# Patient Record
Sex: Female | Born: 1955 | Race: White | Hispanic: No | Marital: Married | Smoking: Never smoker
Health system: Southern US, Community
[De-identification: ages and names within clinical notes are randomized; demographics above are authoritative.]

## PROBLEM LIST (undated history)

## (undated) DIAGNOSIS — F1010 Alcohol abuse, uncomplicated: Secondary | ICD-10-CM

## (undated) DIAGNOSIS — C801 Malignant (primary) neoplasm, unspecified: Secondary | ICD-10-CM

## (undated) DIAGNOSIS — R51 Headache: Secondary | ICD-10-CM

## (undated) DIAGNOSIS — K859 Acute pancreatitis without necrosis or infection, unspecified: Secondary | ICD-10-CM

## (undated) DIAGNOSIS — F419 Anxiety disorder, unspecified: Secondary | ICD-10-CM

## (undated) DIAGNOSIS — I10 Essential (primary) hypertension: Secondary | ICD-10-CM

## (undated) HISTORY — PX: SHOULDER SURGERY: SHX246

## (undated) HISTORY — PX: APPENDECTOMY: SHX54

## (undated) HISTORY — PX: JOINT REPLACEMENT: SHX530

## (undated) HISTORY — PX: LUNG LOBECTOMY: SHX167

## (undated) SURGERY — OPEN REDUCTION INTERNAL FIXATION (ORIF) PROXIMAL HUMERUS FRACTURE
Anesthesia: General | Laterality: Left

---

## 1998-10-04 ENCOUNTER — Emergency Department (HOSPITAL_COMMUNITY): Admission: EM | Admit: 1998-10-04 | Discharge: 1998-10-04 | Payer: Self-pay | Admitting: Emergency Medicine

## 1999-01-11 ENCOUNTER — Inpatient Hospital Stay (HOSPITAL_COMMUNITY): Admission: AD | Admit: 1999-01-11 | Discharge: 1999-01-14 | Payer: Self-pay | Admitting: *Deleted

## 2001-06-28 ENCOUNTER — Other Ambulatory Visit: Admission: RE | Admit: 2001-06-28 | Discharge: 2001-06-28 | Payer: Self-pay | Admitting: Obstetrics and Gynecology

## 2003-01-23 ENCOUNTER — Encounter (INDEPENDENT_AMBULATORY_CARE_PROVIDER_SITE_OTHER): Payer: Self-pay | Admitting: Specialist

## 2003-01-23 ENCOUNTER — Ambulatory Visit: Admission: RE | Admit: 2003-01-23 | Discharge: 2003-01-23 | Payer: Self-pay | Admitting: Internal Medicine

## 2003-02-10 ENCOUNTER — Ambulatory Visit (HOSPITAL_COMMUNITY): Admission: RE | Admit: 2003-02-10 | Discharge: 2003-02-10 | Payer: Self-pay | Admitting: Thoracic Surgery

## 2003-02-10 ENCOUNTER — Encounter: Payer: Self-pay | Admitting: Thoracic Surgery

## 2003-02-10 ENCOUNTER — Encounter (INDEPENDENT_AMBULATORY_CARE_PROVIDER_SITE_OTHER): Payer: Self-pay | Admitting: *Deleted

## 2003-02-18 ENCOUNTER — Encounter: Payer: Self-pay | Admitting: Thoracic Surgery

## 2003-02-18 ENCOUNTER — Encounter (INDEPENDENT_AMBULATORY_CARE_PROVIDER_SITE_OTHER): Payer: Self-pay | Admitting: Specialist

## 2003-02-18 ENCOUNTER — Inpatient Hospital Stay (HOSPITAL_COMMUNITY): Admission: RE | Admit: 2003-02-18 | Discharge: 2003-02-24 | Payer: Self-pay | Admitting: Thoracic Surgery

## 2003-02-19 ENCOUNTER — Encounter: Payer: Self-pay | Admitting: Thoracic Surgery

## 2003-02-20 ENCOUNTER — Encounter: Payer: Self-pay | Admitting: Thoracic Surgery

## 2003-02-21 ENCOUNTER — Encounter: Payer: Self-pay | Admitting: Thoracic Surgery

## 2003-02-22 ENCOUNTER — Encounter: Payer: Self-pay | Admitting: Thoracic Surgery

## 2003-02-23 ENCOUNTER — Encounter: Payer: Self-pay | Admitting: Thoracic Surgery

## 2003-02-24 ENCOUNTER — Encounter: Payer: Self-pay | Admitting: Thoracic Surgery

## 2003-03-04 ENCOUNTER — Encounter: Admission: RE | Admit: 2003-03-04 | Discharge: 2003-03-04 | Payer: Self-pay | Admitting: Thoracic Surgery

## 2003-03-04 ENCOUNTER — Encounter: Payer: Self-pay | Admitting: Thoracic Surgery

## 2003-03-25 ENCOUNTER — Encounter: Admission: RE | Admit: 2003-03-25 | Discharge: 2003-03-25 | Payer: Self-pay | Admitting: Thoracic Surgery

## 2003-03-25 ENCOUNTER — Encounter: Payer: Self-pay | Admitting: Thoracic Surgery

## 2003-04-24 ENCOUNTER — Encounter: Payer: Self-pay | Admitting: Thoracic Surgery

## 2003-04-24 ENCOUNTER — Encounter: Admission: RE | Admit: 2003-04-24 | Discharge: 2003-04-24 | Payer: Self-pay | Admitting: Thoracic Surgery

## 2003-06-26 ENCOUNTER — Encounter: Payer: Self-pay | Admitting: Thoracic Surgery

## 2003-06-26 ENCOUNTER — Encounter: Admission: RE | Admit: 2003-06-26 | Discharge: 2003-06-26 | Payer: Self-pay | Admitting: Thoracic Surgery

## 2003-09-25 ENCOUNTER — Encounter: Admission: RE | Admit: 2003-09-25 | Discharge: 2003-09-25 | Payer: Self-pay | Admitting: Thoracic Surgery

## 2003-12-24 ENCOUNTER — Encounter: Admission: RE | Admit: 2003-12-24 | Discharge: 2003-12-24 | Payer: Self-pay | Admitting: Thoracic Surgery

## 2004-07-13 ENCOUNTER — Encounter: Admission: RE | Admit: 2004-07-13 | Discharge: 2004-07-13 | Payer: Self-pay | Admitting: Thoracic Surgery

## 2005-01-25 ENCOUNTER — Encounter: Admission: RE | Admit: 2005-01-25 | Discharge: 2005-01-25 | Payer: Self-pay | Admitting: Thoracic Surgery

## 2005-06-08 ENCOUNTER — Inpatient Hospital Stay (HOSPITAL_COMMUNITY): Admission: EM | Admit: 2005-06-08 | Discharge: 2005-06-16 | Payer: Self-pay | Admitting: Emergency Medicine

## 2005-09-21 ENCOUNTER — Encounter: Admission: RE | Admit: 2005-09-21 | Discharge: 2005-09-21 | Payer: Self-pay | Admitting: Thoracic Surgery

## 2006-04-12 ENCOUNTER — Encounter: Admission: RE | Admit: 2006-04-12 | Discharge: 2006-04-12 | Payer: Self-pay | Admitting: Thoracic Surgery

## 2006-05-05 ENCOUNTER — Inpatient Hospital Stay (HOSPITAL_COMMUNITY): Admission: AD | Admit: 2006-05-05 | Discharge: 2006-05-08 | Payer: Self-pay | Admitting: Orthopedic Surgery

## 2006-07-31 ENCOUNTER — Other Ambulatory Visit: Admission: RE | Admit: 2006-07-31 | Discharge: 2006-07-31 | Payer: Self-pay | Admitting: Family Medicine

## 2009-02-09 ENCOUNTER — Other Ambulatory Visit: Admission: RE | Admit: 2009-02-09 | Discharge: 2009-02-09 | Payer: Self-pay | Admitting: Family Medicine

## 2009-02-11 ENCOUNTER — Ambulatory Visit (HOSPITAL_BASED_OUTPATIENT_CLINIC_OR_DEPARTMENT_OTHER): Admission: RE | Admit: 2009-02-11 | Discharge: 2009-02-11 | Payer: Self-pay | Admitting: Family Medicine

## 2009-02-11 ENCOUNTER — Ambulatory Visit: Payer: Self-pay | Admitting: Diagnostic Radiology

## 2009-03-23 ENCOUNTER — Encounter (INDEPENDENT_AMBULATORY_CARE_PROVIDER_SITE_OTHER): Payer: Self-pay | Admitting: Obstetrics and Gynecology

## 2009-03-23 ENCOUNTER — Ambulatory Visit (HOSPITAL_COMMUNITY): Admission: RE | Admit: 2009-03-23 | Discharge: 2009-03-23 | Payer: Self-pay | Admitting: Obstetrics and Gynecology

## 2010-03-04 ENCOUNTER — Other Ambulatory Visit: Admission: RE | Admit: 2010-03-04 | Discharge: 2010-03-04 | Payer: Self-pay | Admitting: Obstetrics and Gynecology

## 2011-01-03 LAB — BASIC METABOLIC PANEL
CO2: 26 mEq/L (ref 19–32)
Calcium: 9.2 mg/dL (ref 8.4–10.5)
Creatinine, Ser: 0.61 mg/dL (ref 0.4–1.2)
GFR calc Af Amer: >60 mL/min (ref >60)
Glucose, Bld: 114 mg/dL — ABNORMAL HIGH (ref 70–99)
Sodium: 138 mEq/L (ref 135–145)

## 2011-01-03 LAB — CBC
Platelets: 236 10*3/uL (ref 150–400)
RBC: 4.94 MIL/uL (ref 3.87–5.11)
WBC: 5.2 10*3/uL (ref 4.0–10.5)

## 2011-02-08 NOTE — Op Note (Signed)
[name], [name]               ACCOUNT NO.:  [id number]   MEDICAL RECORD NO.:  [id number]          PATIENT TYPE:  AMB   LOCATION:  SDC                           FACILITY:  [facility]   PHYSICIAN:  Gerald Leitz, MD          DATE OF BIRTH:  1955-10-25   DATE OF PROCEDURE:  03/23/2009  DATE OF DISCHARGE:  03/23/2009                               OPERATIVE REPORT   PREOPERATIVE DIAGNOSES:  1. Postmenopausal bleeding.  2. Endometrial polyp.   POSTOPERATIVE DIAGNOSES:  1. Postmenopausal bleeding.  2. Endometrial polyp.   PROCEDURES:  Hysteroscopy, dilation and curettage, resection of  endometrial polyp.   SURGEON:  Gerald Leitz, MD   ASSISTANT:  None.   ANESTHESIA:  General.   FINDINGS:  Endometrial polyp.   SPECIMEN:  Endometrial curettings and endometrial polyp.   DISPOSITION TO SPECIMEN:  Pathology.   ESTIMATED BLOOD LOSS:  Minimal.   SORBITOL DEFICIT:  100 mL.   INDICATIONS:  This is a 55 year old postmenopausal lady, noted to have  suspected endometrial polyp on ultrasound, measuring 1.6 x 1.2 cm.   PROCEDURES IN DETAIL:  She was taken to the operating room where she was  placed under general anesthesia.  She was placed in the dorsal lithotomy  position and then prepped and draped in the usual sterile fashion.  A  speculum was placed into the vaginal vault and the anterior lip of the  cervix was grasped with a single-tooth tenaculum.  The uterus was  sounded to 7 cm.  The uterus was dilated to approximately 6 mm and  diagnostic hysteroscope was inserted with the findings noted above.  Attempts at removing the endometrial polyp with polyp forceps was  attempted.  Portion of the polyp was removed.  However, a large portion  remained.  Decision was made to resect the remaining portion of the  polyp.  At this point, the resectoscope was introduced into the  endometrial canal and the polyp was resected successfully.  Once  resection was complete, the resectoscope was removed and  the sharp  curette was introduced and a curettage was performed until a gritty  texture was noted.  A 10 mL of 0.25%  Marcaine were injected at the 4  and 8 o'clock position for postoperative anesthesia.  Single tooth  tenaculum was  removed from the anterior lip of the cervix.  Excellent hemostasis was  noted.  Speculum was removed.  The patient was awake from anesthesia and  taken to the recovery room awake and in stable condition.  Sponge, lap  and needle counts were correct x2.      Gerald Leitz, MD  Electronically Signed     TC/MEDQ  D:  03/23/2009  T:  03/24/2009  Job:  [id number]

## 2011-02-11 NOTE — Op Note (Signed)
[name], [name]                           ACCOUNT NO.:  [id number]   MEDICAL RECORD NO.:  [id number]                   PATIENT TYPE:  INP   LOCATION:  2899                                 FACILITY:  [facility]   PHYSICIAN:  Ines Bloomer, M.D.              DATE OF BIRTH:  [birth year]   DATE OF PROCEDURE:  02/18/2003  DATE OF DISCHARGE:                                 OPERATIVE REPORT   PREOPERATIVE DIAGNOSES:  1. 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  2. Mediastinal and hilar adenopathy.  3. 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   POSTOPERATIVE DIAGNOSES:  1. Carcinoid tumor, right bronchus intermedius.  2. 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   PROCEDURES:  1. Right video-assisted thoracoscopic surgery.  2. Right thoracotomy with rib resection.  3. Right middle and right lower lobectomy.   SURGEON:  Ines Bloomer, M.D.   ASSISTANT:  Toribio Harbour, N.P.   CLINICAL NOTE:  This patient had presented with a right lower lobe pneumonia  and atelectasis, underwent a bronchoscopy, which showed a lesion in the  right lower lobe that was coming up into the bronchus intermedius.  Mediastinoscopy had also been done, which revealed granulomatous  inflammation in the nodes.  She is brought to the operating room for  probable right middle and right lower lobectomy.   DESCRIPTION OF PROCEDURE:  After general anesthesia and percutaneous  insertion of all monitoring lines, she was turned to the right lateral  thoracotomy position and a dual-lumen tube was inserted.  She was prepped  and draped in the usual sterile manner.  Two trocar sites were made in the  seventh intercostal space at the anterior and posterior axillary lines.  A  trocar was inserted and a 30 degree scope was inserted, and you could see  complete collapse of the right lower lobe.  No evidence of pulmonary  metastases.  A posterolateral thoracotomy was made over the fifth  intercostal space.  The latissimus was divided and  the serratus was  reflected anteriorly.  However, in trying to enter this space because of her  body habitus, it was really hard to get the intercostal space open.  The  ribs were very narrow space, so it was decided to do a sixth rib resection,  and this was taken out using the [name] periosteal [name] using the  subperiosteal resection, and then using the [name] to  complete the resection.  A [name] was used to remove the rib.  After the rib had been removed, a Finochietto rib [name] was inserted and  a [name] was inserted at right angles.  The inferior pulmonary  ligament was taken down.  There were a lot of adhesions in the inferior  pulmonary ligament, and the diaphragmatic surface of the right lower lobe  was stuck to the diaphragm.  This was taken down by electrocautery.  The  inferior pulmonary vein was dissected out  and looped with a vascular tape.  Several 9R nodes were taken.  Then dissection was started in the fissure.  Dissecting down in the fissure there were very large nodes in the fissure,  and these were dissected out.  10R and 11R nodes were dissected free until  first of all the superior portion of the fissure was dissected out,  dissecting out first the posterior mediastinum and then dissecting down in  the fissure and dividing the superior portion of the fissure with three  applications of the EZ-45 and removing several 10R and 11R nodes.  These  nodes were sent for frozen section that revealed granulomatous inflammation.  There was a small posterior branch to the right upper lobe from the  pulmonary artery, and this was doubly ligated and divided with 2-0 silk and  clipped with a vascular tape.  Then the branch to the right middle lobe was  also dissected out, ligated with the Autosuture stapler, and then the  pulmonary artery to the right lower lobe was dissected out, looped with a  vascular tape, and stapled and  divided with an Systems analyst.  Then the  inferior pulmonary vein was divided with the [facility] stapler.  The vein  to the middle lobe went off the superior pulmonary vein, was dissected out  and divided with the [facility] stapler.  The right middle lobe minor  fissure was divided with four applications of the EZ-45 stapler.  Then a TL-  30 stapler was placed around the bronchus intermedius, stapled, and divided.  The right middle lobe and the right lower lobe were removed, taking all the  lymph nodes with them, including a lot of 7 nodes that were inferior to the  bronchus.  The bronchial margins were negative.  It was a carcinoid tumor  that was also almost completely occluding the right lower lobe and it caused  abscess formation of the right lower lobe, and the debris from that was  cultured.  All bleeding was electrocoagulated.  An intercostal muscle flap  was created from the fifth intercostal space, and this was rotated down and  sutured over the bronchial stump with interrupted 2-0 silk.  No air leaks  were seen underwater after being checked underwater.  Two chest tubes were  brought into the trocar sites and tied in place with 0 silk.  The chest was  closed with five paracostals of double-stranded 0 chromic, #1 Vicryl in the  muscle layer, 2-0 Vicryl in the subcutaneous tissue, and Ethicon skin clips.  The patient returned to the recovery room in serious condition.                                               Ines Bloomer, M.D.    DPB/MEDQ  D:  02/18/2003  T:  02/18/2003  Job:  [id number]   cc:   Casimiro Needle B. Sherene Sires, M.D. [facility]

## 2011-02-11 NOTE — Op Note (Signed)
NAMEYASMINE, [name]               ACCOUNT NO.:  [id number]   MEDICAL RECORD NO.:  [id number]          PATIENT TYPE:  INP   LOCATION:  5039                         FACILITY:  [facility]   PHYSICIAN:  [name], M.D. DATE OF BIRTH:  1955/12/21   DATE OF PROCEDURE:  05/05/2006  DATE OF DISCHARGE:                                 OPERATIVE REPORT   SURGEON:  [name], M.D.   ASSISTANT:  Donnie Coffin. Durwin Nora, P.A.   PREOPERATIVE DIAGNOSIS:  Right shoulder displaced proximal humerus fracture.   POSTOPERATIVE DIAGNOSIS:  Right shoulder displaced proximal humerus  fracture.   PROCEDURE PERFORMED:  Right shoulder hemiarthroplasty using DePuy [employer] FX  system.   ANESTHESIA:  General anesthesia was used.   ESTIMATED BLOOD LOSS:  250 cc.   FLUID REPLACEMENT:  1250 cc.   URINE OUTPUT:  Was not recorded.   COUNTS:  Instrument count was correct.   COMPLICATIONS:  There were no complications.   ANTIBIOTICS:  Perioperative antibiotics were given.   INDICATIONS:  The patient is a 55 year old female with a history of a fall  onto the right shoulder.  The patient presented to orthopedics with a  displaced proximal humerus fracture.  The patient had gross angulation of  the fracture site, significant comminution of the tuberosities and rotation  of the humeral head posteriorly.  The patient underwent CT scanning,  verifying the comminuted, displaced nature of her fracture.  The patient was  admitted for further care, and after discussing the patient's options for  management to include attempted ORIF versus humeral arthroplasty, I  recommended to her arthroplasty, and she consented to this.  Informed  consent was obtained.   DESCRIPTION OF PROCEDURE:  After an adequate level of anesthesia was  achieved, the patient was positioned in modified beach-chair position and  all neurovascular structures padded appropriately.  The right shoulder was  sterilely prepped and draped in the  usual manner.  A deltopectoral approach  was done.  We utilized an incision from the coracoid process down to the  anterior insertion of the deltoid.  Dissection was carried sharply down  through subcutaneous tissues using Bovie electrocautery.  We  identified the  cephalic vein and took it laterally with the deltoid, and the pectoralis  medially.  We released the upper 0.5 cm of pectoralis to facilitate  visualization.  We identified the conjoined tendon, took it medially, and  then identified the fractured humerus.  The patient's greater tuberosity was  well posterior and fragmented in several pieces.  The rotator cuff was  attached to these pieces.  The subscapularis and lesser tuberosity were  still attached to the humeral head.  We went ahead and used an osteotome  through the bicipital groove to separate the lesser tuberosity off.  We then  freed it up from surrounding capsule and the coracoid process until we had a  nice balance.  Next, we retrieved the head, which was sized to a 48 x 18  head, and then placed sutures in the subscapularis medial to the lesser  tuberosity, and in the rotator cuff lateral  to the greater tuberosity  fragment in a modified W-stitch technique, gaining good purchase with 3  sutures out on the greater tuberosity and 2 on the medial lesser tuberosity.  At this point, we went ahead and prepared the humeral shaft, went ahead and  found the canal with the 6-mm reamer, and then placed an 8-mm reamer, which  we actually had to work fairly hard to get that fully down.  And then I  attempted to pass a 10-mm reamer, thinking that she would be a 10, and could  only get that half way down and worked  quite hard on this and simply could  not pass that all the way down despite using vigorous hand-driven reaming.  At this juncture, I went ahead and decided to use a size 8 prosthesis for  her.  I feel that likely her cortices were thicker and she simply tapered  down to  much to use a 10.  Thus, we placed the 8 in with a 48 x an 18 head,  and trialed this and selected the appropriate height.  The patient was then  laser marked.  These were 6 laser marks down the fractured humerus at the  bicipital groove.  Next, we then removed the trial components, placed 3  drill holes in the humerus, and two #2 FiberWire sutures through those  holes.  We placed an around-the-world stitch through the prosthesis, and  then went ahead using DePuy I cement, vacuum mixed, and cemented the  prosthesis into place, again, with the appropriate height and appropriate  rotation based on referencing the anterior fin to the bicipital groove.  Once the cement was allowed to harden, we bone grafted the proximal humerus.  Went ahead and repaired the tuberosities to themselves, to the prosthesis,  to the around-the-world stitch, and also to the humeral shaft with the  sutures that were through the shaft.  We had a nice repair.  We took this  through a full range of motion.  No relative motion was noted between the  fragments or the prosthesis or the shaft; and, thus, we were happy with the  stability of the repair.  We went ahead and thoroughly irrigated, went ahead  and placed sutures in the deltopectoral interval, protecting the cephalic  vein, and then closed the subcutaneous tissues.  We placed Marcaine in the  deltoid, and in the pectoralis as well to help with postoperative pain, and  then closed the subcutaneous tissues, and then the skin with Vicryl, and  then Monocryl.  Steri-Strips were applied, followed by a sterile dressing.  The patient was taken to the recovery room, having tolerated surgery well.           ______________________________  [name], M.D.     SRN/MEDQ  D:  05/06/2006  T:  05/06/2006  Job:  [id number]

## 2011-02-11 NOTE — Discharge Summary (Signed)
[name], [name]                           ACCOUNT NO.:  [id number]   MEDICAL RECORD NO.:  [id number]                   PATIENT TYPE:  INP   LOCATION:  3307                                 FACILITY:  [facility]   PHYSICIAN:  Ines Bloomer, M.D.              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  Lymph nodes found to have  granulomatous inflammation.   SECONDARY DIAGNOSES:  1. Hypertension.  2. Status post appendectomy in 1975.  3. Status post cesarean sections in April 1983 and February 1989.   PROCEDURES:  Right video-assisted thoracoscopy Feb 18, 2003; right  thoracotomy; right middle lobectomy; right lower lobectomy; and lymph node  dissection performed by D. Karle Plumber, M.D.   HISTORY:  [name] is a 55 year old female who presented initially with a  chronic cough.  She had pneumonia in January 2004.  Chest x-ray taken at  that time showed a questionable hilar mass with some mediastinal and hilar  adenopathy.  There was also consolidation in the right lower lobe suspicious  for endobronchial lesion.  She underwent bronchoscopy and mediastinoscopy on  Feb 10, 2003.  This study showed granulomatous inflammation consistent with  sarcoid disease.  She had a right middle lobe biopsy which showed submucosal  neoplasm with features suggestive of carcinoid tumor.  She was admitted on  Feb 18, 2003, for right lower lobectomy and possible right middle lobectomy  to rule out carcinoid/sarcoid disease.   HOSPITAL COURSE:  The patient tolerated the procedure well and was  transferred in stable satisfactory condition to the recovery room.  Her  postoperative progress encompassed five days.  She has done well during this  time.  She has been afebrile in the  postoperative period.  She was weaned to  room air.  By postoperative day number three, she was ambulating  independently.  At the time of discharge, her incision was looking good.   Her chest tube had a slight leak in the postoperative period.  The first  posterior tube was removed on postoperative day number two.  The second tube  was placed on water seal postoperative day number four.  A chest x-ray on  the morning of postoperative day number four showed a very small right  apical air space.  The chest tube was removed on postoperative day number  five without difficulty.   The patient's pain was controlled with epidural catheter, then changed to  Percocet, and morphine sulfate for breakthrough pain.   DISCHARGE INSTRUCTIONS:  She is to call Dr. Scheryl Darter office if her incision  starts draining.  Dr. Scheryl Darter office will call her with an appointment for  one week after discharge.  She  is to have a chest x-ray one hour before the  visit at [facility].   DISCHARGE MEDICATIONS:  1. Percocet 5/325 mg 1-2 tabs q.4-6h. p.r.n. pain.  2. Vioxx 25 mg q.d. for three days.  3. She is to restart her Cardizem, Avapro and Ativan as prior to surgery.   ACTIVITY:  She is to walk daily to keep up her strength.  She may shower  daily.   DIET:  She has no restrictions on her diet.   DISCHARGE DISPOSITION:  [name] went home on postoperative day  number six after an uncomplicated recovery.  She has followup with Dr.  Edwyna Shell in one week and medications.     Maple Mirza, P.A.                    Ines Bloomer, M.D.    GM/MEDQ  D:  02/23/2003  T:  02/23/2003  Job:  [id number]   cc:   Weldon Picking, M.D.  [place]  [place] B. Sherene Sires, M.D. [facility]

## 2011-02-11 NOTE — Op Note (Signed)
   [name], [name]                           ACCOUNT NO.:  [id number]   MEDICAL RECORD NO.:  [id number]                   PATIENT TYPE:  INP   LOCATION:  2899                                 FACILITY:  [facility]   PHYSICIAN:  Burna Forts, M.D.             DATE OF BIRTH:  [birth year]   DATE OF PROCEDURE:  02/18/2003  DATE OF DISCHARGE:                                 OPERATIVE REPORT   INDICATION FOR PROCEDURE:  The patient is a 55 year old female with a known  lung bronchus carcinoma to be resected by Ines Bloomer, M.D.   PROCEDURE:  Right thoracotomy with middle and lower lobe resection,  performed by Ines Bloomer, M.D.   ANESTHESIA PROCEDURE:  Placement of low thoracic epidural for postoperative  analgesia.   DESCRIPTION OF PROCEDURE:  Preoperatively risks and benefits of the  placement of the epidural catheter were discussed in detail with the  patient, including alternatives for pain control.  Additionally, Dr. Edwyna Shell  had requested that we place the epidural for postoperative analgesia.  The  patient consented to the placement of the epidural for her postoperative  analgesia.   At the end of the operative procedure the patient was allowed to remain in  the left lateral decubitus position.  A sterile prep of the thoracic area  was conducted.  Then using a Tuohy needle adjacent to the T8-9 interspace,  the epidural space was contacted with a loss of resistance technique and a  catheter threaded with ease approximately 3-4 cm beyond the needle tip, and  the needle was removed.  After negative aspiration for both heme and CSF,  the catheter was injected incrementally with 1% Xylocaine, initial test dose  of 3 mL followed by 5 mL more for a total of 8 mL of 1% containing  approximately 75 mcg of fentanyl.  This was secured in place with tape, the  patient turned supine, and transferred to the PACU after extubation in  stable condition.   DISPOSITION:  The patient  will be followed daily by the department of  anesthesiology for her postoperative analgesia via epidural catheter.                                               Burna Forts, M.D.    JTM/MEDQ  D:  02/18/2003  T:  02/18/2003  Job:  [id number]

## 2011-02-11 NOTE — Op Note (Signed)
   [name], [name]                           ACCOUNT NO.:  [id number]   MEDICAL RECORD NO.:  [id number]                   PATIENT TYPE:  OIB   LOCATION:  2857                                 FACILITY:  [facility]   PHYSICIAN:  Ines Bloomer, M.D.              DATE OF BIRTH:  1956/02/17   DATE OF PROCEDURE:  02/10/2003  DATE OF DISCHARGE:                                 OPERATIVE REPORT   PREOPERATIVE DIAGNOSIS:  Right lower lobe polyp with mediastinal adenopathy.   POSTOPERATIVE DIAGNOSIS:  Right lower lobe polyp with mediastinal  adenopathy.   OPERATION PERFORMED:  Fiberoptic bronchoscopy, mediastinoscopy, laser  bronchoscopy.   SURGEON:  Ines Bloomer, M.D.   ANESTHESIA:  General.   DESCRIPTION OF PROCEDURE:  After general anesthesia, fiberoptic bronchoscope  was passed through the endotracheal tube.  As the video bronchoscope was  used, the carina was in the midline, the left upper lobe, left lower lobe  and left mainstem bronchus were normal.  On the right side, the right upper  lobe orifice was normal, the right mainstem bronchus was normal but the  right bronchus intermedius, you see a polypoid lesion coming up the bronchus  intermedius.  The right middle lobe appeared to be stable.  Biopsies were  taken from this area of the polyp and it started bleeding, so it was decided  to control this with the laser.  The laser was set at 25 watts continuous  using free beam and the oxygen was dropped to 30% and with multiple pulses,  450 joules were used to laser the top part of the mass and stop all  bleeding.  The fiberoptic bronchoscope was then removed.  The anterior neck  was prepped and draped in the usual sterile manner.  A transverse incision  was made.  This was carried down with electrocautery through the  subcutaneous tissue and fascia.  The pretracheal fascia was entered and  biopsies of 7, 4R and 2R were done.  Strap muscles were closed with 0  Vicryl,  subcutaneous tissue with 3-0 Vicryl and Dermabond for the skin.  The  patient tolerated the procedure well, was returned to the recovery room in  stable condition.                                                Ines Bloomer, M.D.    DPB/MEDQ  D:  02/10/2003  T:  02/10/2003  Job:  [id number]   cc:   Casimiro Needle B. Sherene Sires, M.D. [facility]

## 2011-02-11 NOTE — Discharge Summary (Signed)
[name], [name]               ACCOUNT NO.:  [id number]   MEDICAL RECORD NO.:  [id number]          PATIENT TYPE:  INP   LOCATION:  5039                         FACILITY:  [facility]   PHYSICIAN:  [name], M.D. DATE OF BIRTH:  [birth year]   DATE OF ADMISSION:  05/05/2006  DATE OF DISCHARGE:  05/08/2006                                 DISCHARGE SUMMARY   ADMISSION DIAGNOSES:  1. Right proximal humerus fracture.  2. Hypertension.  3. History of renal failure.  4. History of two-thirds of her right lung resected.   DISCHARGE DIAGNOSES:  1. Right proximal humerus fracture status post hemiarthroplasty.  2. Hypertension.  3. History of renal failure.  4. History of two-thirds of her right lung resected.   BRIEF HISTORY:  The patient is a 55 year old female presented to our office  on May 05, 2006 complaining about right shoulder pain status post a fall  the previous night.  The patient was noted to have a completely displaced  humeral head from her shaft.  She was admitted for surgical management of  this humerus fracture.  The patient had all questions answered before  admission to the hospital.   PROCEDURE:  The patient had a right shoulder hemiarthroplasty completed on  May 06, 2006 using a [employer] FX system.   ATTENDING SURGEON:  Malon Kindle, M.D.   ASSISTANT:  Standley Dakins, PA-C   ANESTHESIA:  General anesthesia was used.   ESTIMATED BLOOD LOSS:  250 mL   FLUID REPLACEMENT:  400 mL   No complications and perioperative antibiotics were given.   HOSPITAL COURSE:  The patient was admitted on May 05, 2006 for a  preoperative workup for right shoulder hemiarthroplasty that was completed  on May 06, 2006.  After she underwent the above-stated procedure, which  she tolerated well, she was transferred to 5000 after adequate time in the  post-anesthesia care unit.  On postop day one, the patient complained about  moderate pain to that right shoulder,  was unable to work with any type of  exercises.  That day was kept in a sling with ice to the shoulder.  Her labs  were monitored.  On postop day two, the patient did have some mild  hypokalemia and hyponatremia mainly due to the IV fluids but the patient had  no resulting symptoms.  Her dressing was changed.  There was no sign of  cellulitis, erythema, or infection to her shoulder incision.  Neurovascularly, she is intact distally.  We did review her exercises once  again and the patient elected to be discharged home on May 08, 2006.   DISCHARGE PLAN:  The patient will be discharged home with family members on  May 08, 2006.  Her condition is stable.   DISCHARGE MEDICATIONS:  1. Cardizem/Tiazac 240 mg p.o. daily.  2. Hydrochlorothiazide 25 mg p.o. daily.  3. Avapro 30 mg p.o. daily.  4. Ativan 0.5 mg p.o. q.8h. p.r.n.  5. Robaxin 500 mg p.o. q.6h.  6. Percocet one to two tabs q.4-6h. p.r.n. pain.   ALLERGIES:  CIPRO.   DIET:  Regular.   ACTIVITY:  Non weightbearing with this right upper extremity.      [name], P.A.    ______________________________  [name], M.D.    TBD/MEDQ  D:  05/08/2006  T:  05/08/2006  Job:  [id number]

## 2011-02-11 NOTE — H&P (Signed)
NAME:  [name], [name]                 ACCOUNT NO.:  [id number]   MEDICAL RECORD NO.:  [id number]           PATIENT TYPE:   LOCATION:                                 FACILITY:   PHYSICIAN:  [name], M.D. DATE OF BIRTH:  [birth year]   DATE OF ADMISSION:  DATE OF DISCHARGE:                                HISTORY & PHYSICAL   CHIEF COMPLAINT:  Right shoulder pain.   HISTORY OF PRESENT ILLNESS:  The patient is a 55 year old female that fell  last p.m. on May 04, 2006 injuring her right shoulder. The patient noticed  obvious deformity and swelling, presented to her primary care physician's  office, Dr. Foy [name], earlier this morning diagnosed with a shoulder fracture,  referred to our office. After seeing [place] in the office, she was directed over  to [facility]. She denies any previous problem with her shoulder in  the past.   PAST SURGICAL HISTORY:  Two-thirds of the right lung removed, C section x2  and appendectomy.   PAST MEDICAL HISTORY:  Hypertension, anxiety, headache, sarcoidosis, history  of pancreatitis, kidney failure and lung cancer with lung resection.   CURRENT MEDICATIONS:  Ativan, Lotrel, hydrochlorothiazide, Ultram,  __________ and Cardizem, unknown dosages of each. The patient has allergies  to CIPRO and AZITHROMYCIN.   SOCIAL HISTORY:  A patient of Dr. Marinda Elk, she does not smoke, she is  right-hand dominant.   PHYSICAL EXAMINATION:  GENERAL:  The patient is a healthy-appearing, 55-year-  old female in no acute distress, adequate mood and affect status post  injury. Alert and oriented x3.  CERVICAL SPINE:  She has no tenderness to palpation in the paraspinous  muscles or spinous process. She has full range of motion without any  difficulty and cranial nerves II-XII are grossly intact.  EXTREMITIES:  Examination of the right upper extremity shows obvious edema  and ecchymosis to the right upper extremity. No range of motion was  attempted  secondary to known fracture. Capillary refill was 2 seconds  distally. Examination of the left upper extremity and bilateral lower  extremity shows full range of motion. Sensation grossly intact. No gross  deformity.  RESPIRATORY:  Respirations are normal.  ABDOMEN:  Nontender, nondistended.   X-rays show a right proximal humerus fracture with complete displacement.   IMPRESSION:  Right proximal humerus fracture with complete displacement.   PLAN:  Dr. Ranell Patrick is to direct admit this patient to [facility] for  surgical open reduction and internal fixation versus hemiarthroplasty  tomorrow morning. Labs and CT scans have been ordered and we will see the  patient in the hospital.      Maisie Fus B. Dixon, P.A.    ______________________________  [name], M.D.    TBD/MEDQ  D:  05/05/2006  T:  05/05/2006  Job:  [id number]

## 2011-02-11 NOTE — Discharge Summary (Signed)
NAMEBETTEJANE, LEAVENS NO.:  [id number]   MEDICAL RECORD NO.:  [id number]          PATIENT TYPE:  INP   LOCATION:  2039                         FACILITY:  [facility]   PHYSICIAN:  Sherin Quarry, MD      DATE OF BIRTH:  03-23-1956   DATE OF ADMISSION:  06/08/2005  DATE OF DISCHARGE:  06/16/2005                                 DISCHARGE SUMMARY   [name] is a 55 year old lady who has a past history of resection of  the right middle and lower lobe of her lung apparently for a carcinoid  tumor. Ms. Paulding presented to the hospital on June 08, 2005 with a  three-week history of an illness characterized by nausea, vomiting,  diarrhea, abdominal pain and decreased oral intake. She had developed  urinary tract symptoms had been treated with Bactrim DS for a possible  urinary tract infection prior to her admission. For several days prior to  admission, she reported generalized muscle aches and feeling unsteady on her  feet. On June 08, 2005, she presented to Dr. Pablo Lawrence office where  laboratory studies were obtained showing that her serum creatinine was 13.9.  She was therefore sent to the emergency room for evaluation of acute renal  failure.   In the emergency room, the patient was seen by Dr. Derenda Mis. At that  time, her temperature was 97.3, blood pressure was 76/19. She was  subsequently given two liters of fluid by bolus infusion and blood pressure  came up to 122/90. Respiratory rate was 20, O2 saturation 96%. The patient  complained of generalized aching. HEENT exam was within normal limits. The  chest was clear. Cardiovascular revealed normal S1-S2 without rubs, murmurs  or gallops. The abdomen was diffusely tender. There was no guarding or  rebound. No masses were appreciated. Neurologic testing examination  extremities was normal.   Initial laboratory studies including a sodium of 126, potassium 3.2,  creatinine was 13.9, BUN 168. White count  was 11.7, hemoglobin 10.1.  Urinalysis was unremarkable. A CT scan of the abdomen was obtained with  findings consistent with acute pancreatitis. There was decreased enhancement  in the head and uncinate process of the pancreas and a fluid collection  adjacent to the pancreas. On admission, the patient was placed empirically  on imipenem 5 milligrams IV every 6 hours. Intravenous fluids in the form of  d5w with 3 ampules of bicarbonate were infused at 150 cc/hour. Protonix 40  milligrams IV every 24 hours was started. Several sets of blood cultures  were obtained which were subsequently negative. It should be noted that the  CT scan of the abdomen was not actually obtained until the three days after  admission because of renal dysfunction.   The patient's renal function was carefully monitored with IV hydration. By  June 09, 2005, creatinine was down to 9.1. Later in the day on  June 09, 2005, the creatinine was checked again was down to 4.2. by  June 10, 2005, creatinine was down to 2.2.   Consultation was obtained from Dr. Hyman Hopes of the nephrology service who agreed  that the patient's basic problem was massive volume depletion with secondary  acute renal failure probably exacerbated by ACE inhibitors and Goody  powders. He agreed with plan to continue large volume fluid resuscitation.   By June 10, 2005, the patient was feeling better and she continued a  pattern of slow improvement. On June 10, 2005, a clear liquid diet was  begun. On June 14, 2005, the patient had a single episode of nausea,  but really this has been completely resolved. By June 15, 2005,  creatinine was down to 0.6, BUN 9, potassium was 3.7. CO2 was 26 showing  resolution of acidosis. Serial blood cultures were negative. Her blood  pressure was stable and 120-130/80. During the course of hospitalization,  the patient's alcohol consumption was discussed on several occasions. The   patient indicated that she was not drinking excessive amount of alcohol. On  June 16, 2005, the patient's status appeared to be stable and she was  gradually getting stronger. She was tolerating a bland diet and decision was  made to discharge at that time.   DISCHARGE DIAGNOSES:  1.  Acute renal failure secondary to prerenal azotemia with associated      massive volume depletion.  2.  Hypotension secondary to #1.  3.  Metabolic acidosis secondary to #1.  4.  Hypokalemia resolved.  5.  Pancreatitis.  6.  History of hypertension.  7.  History of appendectomy.  8.  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   DISCHARGE MEDICATIONS:  1.  On discharge, the patient will continue Ativan which she normally takes      a dose of 1 milligram b.i.d.  2.  I advised her to take Protonix 40 milligrams daily.  3.  I advised her to withhold Lotrel 5/20.  4.  I told her to resume Tenoretic 50/25 one daily.   She ready has an appointment to see Dr. Foy Guadalajara back in the office next week.  The patient was advised on several occasions of the importance of absolutely  drinking no alcohol and of not taking any Goody powders or any nonsteroidal  anti-inflammatory drugs.           ______________________________  Sherin Quarry, MD     SY/MEDQ  D:  06/16/2005  T:  06/16/2005  Job:  [id number]   cc:   Molly Maduro L. Foy Guadalajara, M.D.  [address] [place]  [place] [address]  Fax: [phone]   Garnetta Buddy, M.D.  Fax: [phone]

## 2011-02-11 NOTE — Consult Note (Signed)
[name], [name] NO.:  [id number]   MEDICAL RECORD NO.:  [id number]          PATIENT TYPE:  INP   LOCATION:  1828                         FACILITY:  [facility]   PHYSICIAN:  Garnetta Buddy, M.D.   DATE OF BIRTH:  Oct 05, 1955   DATE OF CONSULTATION:  06/08/2005  DATE OF DISCHARGE:                                   CONSULTATION   REASON FOR CONSULTATION:  Elevated BUN and serum creatinine.   HISTORY OF PRESENT ILLNESS:  This is a 55 year old white female carcinoid  tumor of the lung, status post resection right middle lobe and right lower  lobe, endobronchial component in 2004. She presented with a three-week  history of nausea, vomiting, diarrhea, abdominal pain, decreased oral  intake, two-day history of fatigue, weakness, and myalgias.   PAST MEDICAL HISTORY:  1.  Hypertension.  2.  History of appendectomy in 1975.  3.  History of Cesarean section in 1983 and 1989.  4.  History of carcinoid tumor of lungs, status post resection in 2004.   MEDICATIONS:  1.  Lotrel 20/10 mg daily.  2.  Atenolol 50 mg daily.  3.  Aspirin one tablet daily.  4.  Goody Powder over-the-counter.   ALLERGIES:  CIPRO.   SOCIAL HISTORY:  Married. No tobacco or alcohol.   FAMILY HISTORY:  Noncontributory.   REVIEW OF SYSTEMS:  GENERAL: No fever or chills. Admits to fatigue,  weakness, and myalgias. EYES: No visual decrease. No diplopia. EARS, NOSE,  MOUTH, THROAT: No hearing loss. Complains of dry mouth. No sinusitis. No  epistaxis. CARDIOVASCULAR: No shortness of breath, chest pain, or ankle or  leg swelling. RESPIRATORY:  No cough, wheeze, or hemoptysis. ABDOMEN: No  history of EGD or colonoscopy. No history of blood in stools or hematemesis.  History of abdominal pain, diarrhea, nausea, and vomiting. UROGENITAL: No  history of dysuria, urgency, or frequency. NEUROLOGIC: No history of  headache, diplopia. Dysarthria, or dysphagia. No numbness or tingling in the  left lower  extremity. ENDOCRINE:  No history of diabetes and no history of  thyroid disease or renal disease. HEMATOLOGIC/ONCOLOGIC: No history of DVT,  problem with __________  or bleeding diathesis. History of carcinoid.  MUSCULOSKELETAL:  Diffuse myalgias. No history of gout. Use of Goody Powder.   PHYSICAL EXAMINATION:  GENERAL: Alert, pale lady who is slightly anxious, in  no obvious distress.  VITAL SIGNS: Blood pressure 90 systolic, pulse 85, temperature afebrile,  respiratory rate 12, saturations 100% on two liters oxygen.  HEENT: No icterus. Bilateral conjunctival pallor. Extraocular movements  intact. Pupils equal, round, and reactive. Clear oropharynx. Poor dentition.  Nasal mucosa is clear. Dry mucous membranes.  NECK: Supple with JVD flat. No thyromegaly, tinted skin.  CARDIOVASCULAR: Regular rate and rhythm. No murmurs, rubs, or gallops.  RESPIRATORY: Lung fields are clear. No wheezes or rales.  ABDOMEN: Tender epigastric, no masses, no organosplenomegaly. Bowel sounds  are diminished.  EXTREMITIES: No clubbing, cyanosis, or edema.  NEUROLOGIC: Alert and oriented, symmetric reflexes. No loss of sensation.   LABORATORY DATA:  Sodium 126, potassium 3.2, chloride 94, CO2 11,  BUN 168,  creatinine 13.9, glucose 133, calcium 8.4. WBC 11.7, hemoglobin 10.1,  platelet count 378,000.  Urinalysis reveals wbc's 3-6 per high power field  and RBCs too numerous to count status post Foley. Lipase 131, lactate 0.9,  CPK 18.   CT scan of abdomen revealed pancreatic edema. No evidence of ureteric  obstruction. Chest x-ray  Clear.   ABG reveals pH of 7.294, PCO2 18, PO2 121.   ASSESSMENT:  1.  Acute renal failure with massive volume depletion in the setting of ACE      inhibitors, Goody Powders, and evidence of clinically acute pancreatitis      as well as radiologic appearance of pancreatitis. She has no evidence of      multi-organ failure at this time, no sepsis, and no acute respiratory       distress syndrome.  2.  Hypotension. Agree with massive volume resuscitation. Recommend IV      fluids, CVP monitoring, and aggressive volume resuscitation with normal      saline, bicarbonate, potassium chloride, and ICU monitoring.  3.  Metabolic acidosis. Treat with intravenous bicarbonate therapy.  4.  Hypokalemia. Treat with intravenous potassium therapy.   DISPOSITION:  The patient is to be transferred to the ICU. Full discussion  with Dr. Derenda Mis. No indications for acute dialysis at the present  time.  I have discussed this with the family and agree with massive volume  resuscitation to a CVT in 12.  Critical care consultation recommended as  well as monitoring of laboratory variations.      Garnetta Buddy, M.D.  Electronically Signed     MWW/MEDQ  D:  06/08/2005  T:  06/09/2005  Job:  [id number]

## 2011-02-11 NOTE — H&P (Signed)
NAME:  [name], Maria A.                        ACCOUNT NO.:  [id number]   MEDICAL RECORD NO.:  [id number]                   PATIENT TYPE:  INP   LOCATION:  NA                                   FACILITY:  [facility]   PHYSICIAN:  Ines Bloomer, M.D.              DATE OF BIRTH:  16-Sep-1956   DATE OF ADMISSION:  02/18/2003  DATE OF DISCHARGE:                                HISTORY & PHYSICAL   REFERRING PHYSICIANS:  1. Casimiro Needle B. Sherene Sires, M.D.  2. Doris Cheadle. Foy Guadalajara, M.D.   CHIEF COMPLAINT:  Right lower lobe carcinoid tumor.   BRIEF HISTORY:  The patient is a 55 year old white female who presented with  chronic cough in January of 2004.  She was diagnosed with pneumonia and was  treated with antibiotics.  Chest x-ray showed a hilar mass and a right  mediastinal hilar adenopathy with consolidation of the right lower lobe.  She also was found to have a bronchial lesion.  This was confirmed with CT  after adequate antibiotic treatment for her reported bronchitis/pneumonia.  She was eventually referred to Casimiro Needle B. Sherene Sires, M.D., and he sent her to  Ines Bloomer, M.D., for evaluation at this time.  The patient underwent  bronchoscopy and mediastinoscopy on Feb 10, 2003, which showed granulomatous  inflammation consistent with sarcoid.  A right middle lobe biopsy was also  done at this point and a submucosal neoplasm with features suggestive of  carcinoid tumor was found.  She is now admitted for right lower lobectomy  and possible right middle lobectomy to rule out carcinoid and sarcoid  disease.  Pulmonary function studies showed an FVC of 2.39 L, which was 65%  of normal.  The FEV1 was 2.01 L, which is 67% of normal.   PAST MEDICAL HISTORY:  Essentially negative, except for some hypertension.   PAST SURGICAL HISTORY:  She had an appendectomy in May of 1975 and C-  sections on March 05, 1982, and November 14, 1987.   MEDICATIONS ON ADMISSION:  1. Cardizem LA 360 mg daily.  2. Avapro 300  mg daily.  3. Ativan one p.o. b.i.d.  She has been on this for two years.   ALLERGIES:  No allergies.  She is quite sensitive to CIPRO, which causes  nausea.   REVIEW OF SYSTEMS:  No history of thyroid disease.  No swallowing  difficulties.  Her weight is actually up since Christmas and she was hoping  to lose some.  She has no history of diabetes, kidney disease, asthma, or  COPD.  No history of TIAs or strokes.  No history of syncope or amaurosis.  No history of coronary artery disease, angina, or arrhythmia.  No history of  MI.  No history of pulmonary embolus.  No history of DVT.  No history of GI  bleed.  No trouble with reflux.  She has no problems with diarrhea or  constipation.  There is no history of congestive heart failure.  She does  have a history of hypertension.  No history of shortness of breath, dyspnea  on exertion, PND, or orthopnea.  She has a history of bronchitis on a year  basis.  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   FAMILY HISTORY:  Her mother is living at age 32, but she has DVT, pulmonary  embolus, and cardiac problems.  Her father died at age 64 with leukemia.  She has one sister in good health and no brothers.  She has been married 23  years.  Two children, ages 27 and 39, both in good health.  She has worked  as a Engineer, site for many years and currently is working as a proof  Chief Operating Officer.  She has never used tobacco or alcohol.   PHYSICAL EXAMINATION:  GENERAL APPEARANCE:  This is a well-nourished, well-  developed, white female, slightly overweight and in no acute distress.  HEENT:  Head:  Normocephalic.  Eyes:  PERRLA.  EOMs intact.  Fundi not  visualized well.  No arcus senilis.  NECK:  Supple.  No JVD.  No bruits.  No thyromegaly.  No lymphadenopathy.  CHEST:  Clear to auscultation and percussion bilaterally.  No murmurs, rubs,  or wheezing.  CARDIAC:  Regular S1.  Split S2.  No S3 or S4.  No murmurs or rubs.   ABDOMEN:  Soft and nontender.  Positive bowel sounds.  No  hepatosplenomegaly.  No masses.  No bruits.  GENITOURINARY:  Deferred.  RECTAL:  Deferred.  EXTREMITIES:  No cyanosis, clubbing, or edema.  The hair pattern is normal.  Pulses are +2 and equal throughout the distribution.  NEUROLOGIC:  No focal deficits.   IMPRESSION:  1. Carcinoid, right lower lobe.  2. Questionable sarcoid.  3. Hypertension.  4. History of anxiety.   PLAN:  The patient is admitted for right thoracotomy, right lower lobectomy,  and possible right middle lobectomy.     Eber Hong, P.A.                 Ines Bloomer, M.D.    WDJ/MEDQ  D:  02/14/2003  T:  02/14/2003  Job:  [id number]

## 2011-02-11 NOTE — H&P (Signed)
NAMEARTEMISIA, [name]               ACCOUNT NO.:  [id number]   MEDICAL RECORD NO.:  [id number]          PATIENT TYPE:  EMS   LOCATION:  MAJO                         FACILITY:  [facility]   PHYSICIAN:  Melissa L. Ladona Ridgel, MD  DATE OF BIRTH:  01/06/1956   DATE OF ADMISSION:  06/08/2005  DATE OF DISCHARGE:                                HISTORY & PHYSICAL   CHIEF COMPLAINT:  Muscle pain, nausea, hypertension, and acute renal  failure.   PRIMARY CARE PHYSICIAN:  Robert L. Foy Guadalajara, M.D.   HISTORY OF PRESENT ILLNESS:  The patient is a 55 year old white female who 2  weeks ago started with gastroenteritis-like symptoms.  She was treated  conservatively, but then progressed on to having urinary tract-like  symptoms.  She was given Bactrim DS and Bentyl to treat her symptoms of  dysuria and the finding of elevated white cells in her urine.  She states  that she really never recovered from the symptoms of nausea and vomiting,  and over the past couple of weeks following that treatment, she continued to  have nausea, inability to eat, weakness, unable to really drink.  She has  developed muscle pain and body aches all over in the last week, and has  become notably wobbly with her walking and intermittently dizzy.  She  presented to her primary care physician's office with these complaints, and  had laboratory values that were drawn showing that she was in acute renal  failure with a creatinine of 13.9.  She was therefore sent to the emergency  room for further evaluation, as her blood pressure was also noted to be  quite low.   REVIEW OF SYSTEMS:  She denies fever, chills, but has nausea, vomiting.  No  melena, no hematochezia, no diarrhea, no constipation.  Positive body aches.  All other review of systems are negative.  See HPI for further positives.   PAST MEDICAL HISTORY:  1.  Small cell lung cancer, according to the patient.  2.  Hypertension.  3.  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  (No diabetes.).   PAST SURGICAL HISTORY:  She had a right lung resection done by Dr. Edwyna Shell  for her cancer.   SOCIAL HISTORY:  She denies tobacco.  She has occasional ethanol.  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  She is married.   FAMILY HISTORY:  Mom is living with congestive heart failure.  Dad is  deceased secondary to leukemia.   ALLERGIES:  CIPRO which causes severe stomach problems.   MEDICATIONS:  1.  Lotrel 5/20 daily.  2.  Tenoretic 50/25 daily.  3.  Darvocet-N 100 p.r.n.  4.  Fioricet p.r.n.  5.  Ativan 1 mg b.i.d.  6.  Her spouse relates that she is using Goody's powders quite frequently.   PHYSICAL EXAMINATION:  VITAL SIGNS:  Temperature is 97.3, blood pressure on  admission was 76/19 (after 2 liters of fluid, she came up to 122/101;  without fluid support, she drops into the 80s systolically), respiratory  status 20-26 breaths per minute, saturation 96% on room  air.  GENERAL:  She appears uncomfortable secondary to muscular pain and is  shivering.  She is normocephalic and atraumatic.  Pupils equal, round and  reactive to light.  Extraocular muscles are intact.  Mucous membranes are  dry.  She has poor dentition of her front teeth, which are rotting.  She is  anicteric.  NECK:  Supple.  There is a right EJ placed in the emergency room with good  effusion.  There are no bruits.  CHEST:  Clear to auscultation.  There are no rhonchi, rales, or wheezes.  CARDIOVASCULAR:  Regular rate and rhythm.  Positive S1 and S2.  No S3, no  S4.  No murmurs, rubs, or gallops.  ABDOMEN:  Diffusely tender with no guarding or rebound.  There is no  hepatosplenomegaly, and nondistended.  EXTREMITIES:  Pale, cool skin with +1 pulses, upper and lower extremities.  NEUROLOGIC:  She is awake, alert, and oriented x3.  Cranial nerves II-XII  are intact.  Power is 4/5.  DTRs are 2.   LABORATORY DATA:  Sodium of 126, potassium 3.2, chloride 94, CO2 of 11, BUN   168, creatinine 13.9, glucose is 133, gap is 21.  White count is 11.7,  hemoglobin of 10.1, hematocrit of 29.2, platelets of 37.8.  Lactic acid is  0.9, within normal limits.  CK total of 18, within normal limits.  Urinalysis shows large blood, small leukocyte esterase, specific gravity of  1.031, pH of 5, too numerous to count blood cells.  EKG shows normal sinus  rhythm with first degree AV block.  No ST-T wave changes.  Chest x-ray is  pending.  Ultrasound of the abdomen is pending.  CT of the abdomen is  pending.   ASSESSMENT AND PLAN:  This is a 55 year old white female with acute renal  failure following treatment for a urinary tract infection and  gastroenteritis 3 weeks ago.  1.  Genitourinary.  Acute renal failure.  Differential diagnosis includes      severe dehydration, ATN secondary to Motrin and Goody's powder use,      urinary tract infection.  A nephrology consult has been called.  Will      follow up her renal ultrasound, and will treat her urinary tract      infection with ceftriaxone.  2.  Gastrointestinal.  She has diffuse abdominal pain, most likely secondary      to uremia versus an intra-abdominal process, although her lactic acid is      negative.  Her lipase is elevated.  Question whether this is related to      her renal dysfunction versus pancreatitis.  At this time, we will do a      non-contrasted CT of the abdomen with attention to the pancreas, and      start her on a proton pump inhibitor.  Will also check an amylase to      help in the differential diagnosis.  3.  Pulmonary.  History of lung cancer with carcinoid syndrome.  Will check      a chest x-ray.  4.  Cardiovascular.  History of hypertension.  Will hold her      antihypertensives at this time.  Replete her volume, check her lipids,      and then check an hemoglobin and hematocrit after her rehydration, as I     suspect she may drop her hemoglobin.  Will heme check all her stools.  5.  Gapped  acidosis.  She will be started on  D5 water with 3 amps of      bicarbonate at 150 cc an hour after the next liter of IV fluids.  Will      also check an ABG to help diagnosis her underlying condition.  6.  Endocrine.  She has a history of carcinoid with lung cancer.  No further      complications or recurrence of the carcinoid syndrome is reported.  Will      check a hemoglobin A1C, cover with sliding scale insulin, check a      cortisol to rule out possible adrenal insufficiency, although her      potassium levels and other electrolytes do not support this.  Will      replete her KCl.  7.  Deep vein thrombosis prophylaxis with subcutaneous heparin.      Melissa L. Ladona Ridgel, MD  Electronically Signed     MLT/MEDQ  D:  06/08/2005  T:  06/08/2005  Job:  [id number]   cc:   Molly Maduro L. Foy Gu[place]ara, M.D.  Fax: [phone]

## 2011-02-11 NOTE — Op Note (Signed)
[name], [name]                           ACCOUNT NO.:  [id number]   MEDICAL RECORD NO.:  [id number]                   PATIENT TYPE:  AMB   LOCATION:  CARD                                 FACILITY:  [facility]   PHYSICIAN:  Casimiro Needle B. Sherene [name], M.D. [facility]           DATE OF BIRTH:  05/10/1956   DATE OF PROCEDURE:  01/23/2003  DATE OF DISCHARGE:                                 OPERATIVE REPORT   PROCEDURE:  Bronchoscopy.   HISTORY:  This is a 55 year old white female with recent clinical syndrome  that suggested pneumonia but a chest x-ray that suggested hilar adenopathy  and a CT scan that confirmed hilar adenopathy and possible post obstructive  pneumonia involving the right lower lobe. CT scan also suggested the  possibility of a right breast mass. Mammography is pending and I recommended  bronchoscopy to evaluate the adenopathy and the question of whether there  might be a lesion obstructing the right lower lobe. The working diagnosis  was sarcoid because of the amount of adenopathy present but the differential  diagnosis does include malignancy.   The procedure was performed in the bronchoscopy suite after a full  discussion of the risks, benefits, and alternatives. She received a total of  25 mg IV Demerol and 5 mg IV Versed for adequate sedation and cough  suppression.   The right and left nares and oropharynx were liberally anesthetized with 1%  lidocaine by updraft Nebulizer. The right naris was additionally prepared  with 2% lidocaine jelly.   Using a standard flexible fiberoptic bronchoscope, the right naris was  easily cannulated with good visualization of the entire oropharynx and  larynx. The cords moved normally and there were no apparent upper airway  lesions.   Using an additional 1% lidocaine as needed, the entire tracheobronchial tree  was explored bilaterally with the following findings:   1. Trachea and carina were normal.  2. The left sided airways were  normal.  3. The right upper lobe was normal.  4. There was a polypoid mass extending up from the right lower lobe and     completely obstructing the right lower lobe beyond the take off of the     right middle lobe, which was a slit-like orifice that really could not be     entered with a bronchoscope or identified any further. The polypoid mass     had vessels present with a smooth, glistening mucosa.   PROCEDURE:  Using an endobronchial forceps technique, I biopsied the  polypoid mass x4, with adequate tissue obtained. I also lavaged it for  cytology and AFB and fungal stain for culture to be complete.   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   With one of the female respiratory therapists present, I briefly examined  her right breast and axilla for any obvious mass  or node. Although the exam  was limited, she did appear to have a firmness over the central breast that  corresponded to the lesion seen on chest CT, but I did not palpate any  definite nodes. There were no skin changes overlying the density on breast  exam.   RECOMMENDATIONS:  Await tissue confirmation. If this is indeed breast  cancer, it is clearly stage 4 and the decision will need to be made whether  to treat her systemically versus do a laser resection of the polypoid lesion  that appears to be obstructing the right lower lobe airway.   I did inform the patient's husband that there was a polypoid lesion present  but that I was not able to say at this point whether or not it represents  cancer. However, I did tell him unequivocally this is not sarcoid.                                               [name], M.D. [facility]    MBW/MEDQ  D:  01/23/2003  T:  01/23/2003  Job:  [id number]   cc:   Molly Maduro L. Foy Gu[place], M.D.  [address] [place]  [place] [address]  Fax: [phone]

## 2011-06-01 ENCOUNTER — Other Ambulatory Visit: Payer: Self-pay | Admitting: Obstetrics and Gynecology

## 2011-06-01 ENCOUNTER — Other Ambulatory Visit (HOSPITAL_COMMUNITY)
Admission: RE | Admit: 2011-06-01 | Discharge: 2011-06-01 | Disposition: A | Discharge status: Home / Self Care | Payer: 59 | Source: Ambulatory Visit | Attending: Obstetrics and Gynecology | Admitting: Obstetrics and Gynecology

## 2011-06-01 DIAGNOSIS — Z01419 Encounter for gynecological examination (general) (routine) without abnormal findings: Secondary | ICD-10-CM | POA: Insufficient documentation

## 2012-04-06 ENCOUNTER — Emergency Department (HOSPITAL_COMMUNITY)
Admission: EM | Admit: 2012-04-06 | Discharge: 2012-04-06 | Disposition: A | Discharge status: Home / Self Care | Payer: 59 | Attending: Emergency Medicine | Admitting: Emergency Medicine

## 2012-04-06 ENCOUNTER — Encounter (HOSPITAL_COMMUNITY): Payer: Self-pay | Admitting: Emergency Medicine

## 2012-04-06 DIAGNOSIS — W1789XA Other fall from one level to another, initial encounter: Secondary | ICD-10-CM | POA: Insufficient documentation

## 2012-04-06 DIAGNOSIS — I10 Essential (primary) hypertension: Secondary | ICD-10-CM | POA: Insufficient documentation

## 2012-04-06 DIAGNOSIS — Y998 Other external cause status: Secondary | ICD-10-CM | POA: Insufficient documentation

## 2012-04-06 DIAGNOSIS — Y9301 Activity, walking, marching and hiking: Secondary | ICD-10-CM | POA: Insufficient documentation

## 2012-04-06 DIAGNOSIS — S42209A Unspecified fracture of upper end of unspecified humerus, initial encounter for closed fracture: Secondary | ICD-10-CM

## 2012-04-06 HISTORY — DX: Essential (primary) hypertension: I10

## 2012-04-06 MED ORDER — OXYCODONE-ACETAMINOPHEN 5-325 MG PO TABS: 1.0000 | ORAL_TABLET | Freq: Four times a day (QID) | ORAL | Status: DC | PRN

## 2012-04-06 MED ORDER — HYDROMORPHONE HCL PF 2 MG/ML IJ SOLN
2.0000 mg | Freq: Once | INTRAMUSCULAR | Status: AC
  Administered 2012-04-06: 2 mg via INTRAVENOUS
  Filled 2012-04-06: qty 1

## 2012-04-06 MED ORDER — NAPROXEN 500 MG PO TABS: 500.0000 mg | ORAL_TABLET | Freq: Two times a day (BID) | ORAL | Status: DC

## 2012-04-06 MED ORDER — ONDANSETRON 4 MG PO TBDP
8.0000 mg | ORAL_TABLET | Freq: Once | ORAL | Status: AC
  Administered 2012-04-06: 8 mg via ORAL
  Filled 2012-04-06: qty 2

## 2012-04-06 NOTE — Progress Notes (Signed)
Orthopedic Tech Progress Note Patient Details:  [name] May 19, 1956 [id number]  Ortho Devices Type of Ortho Device: Sling immobilizer Ortho Device/Splint Location: left arm Ortho Device/Splint Interventions: Application   [name] 04/06/2012, 1:17 PM

## 2012-04-06 NOTE — ED Notes (Signed)
Pt c/o left arm pain after falling last night; pt went to PCP and has humerus fracture; pt has xray CD with here and in sling for comfort; pt given percocet at PCP; CMS intact

## 2012-04-06 NOTE — ED Provider Notes (Signed)
History   This chart was scribed for Celene Kras, MD by Shari Heritage. The patient was seen in room TR10C/TR10C. Patient's care was started at 1108.     CSN: [id number]  Arrival date & time 04/06/12  1108   First MD Initiated Contact with Patient 04/06/12 1155      Chief Complaint  Patient presents with  . Arm Pain    (Consider location/radiation/quality/duration/timing/severity/associated sxs/prior treatment) Patient is a 56 y.o. female presenting with fall. The history is provided by the patient. No language interpreter was used.  Fall The accident occurred 12 to 24 hours ago. The fall occurred while walking. She fell from a height of 3 to 5 ft. She landed on a hard floor. The point of impact was the left elbow. The pain is present in the left elbow. The pain is at a severity of 10/10. The pain is severe. She was ambulatory at the scene. There was no entrapment after the fall. There was no drug use involved in the accident. There was no alcohol use involved in the accident. Pertinent negatives include no fever, no numbness and no abdominal pain. Prehospitalization: Sling. Treatments tried: Percocet. The treatment provided mild relief.   [name] is a 56 y.o. female who presents to the Emergency Department complaining of left arm pain resulting from a fall that occurred 18 hours ago. Patient says that she went to her PCP and got an x-ray. PCP diagnosed patient with a left humeral fracture. Patient is in sling for comfort. PCP prescribed Percocet during patient's visit. Patient with h/o HTN. Patient has never smoked.   Past Medical History  Diagnosis Date  . Hypertension     History reviewed. No pertinent past surgical history.  History reviewed. No pertinent family history.  History  Substance Use Topics  . Smoking status: Never Smoker   . Smokeless tobacco: Not on file  . Alcohol Use: Yes     occasional    OB History    Grav Para Term Preterm Abortions TAB SAB Ect  Mult Living                  Review of Systems  Constitutional: Negative for fever.  HENT: Negative for neck pain.   Eyes: Negative for visual disturbance.  Respiratory: Negative for cough.   Cardiovascular: Negative for chest pain.  Gastrointestinal: Negative for abdominal pain.  Genitourinary: Negative for frequency.  Musculoskeletal: Negative for back pain.  Skin: Negative for rash.  Neurological: Negative for numbness.  Psychiatric/Behavioral: The patient is not nervous/anxious.     Allergies  Ciprofloxacin  Home Medications   Current Outpatient Rx  Name Route Sig Dispense Refill  . DILTIAZEM HCL ER 180 MG PO CP24 Oral Take 180 mg by mouth daily.    Marland Kitchen ESCITALOPRAM OXALATE 20 MG PO TABS Oral Take 20 mg by mouth daily.    . OXYCODONE-ACETAMINOPHEN 5-325 MG PO TABS Oral Take 1 tablet by mouth every 4 (four) hours as needed. For pain    . VALSARTAN-HYDROCHLOROTHIAZIDE 160-25 MG PO TABS Oral Take 1 tablet by mouth daily.    Marland Kitchen VITAMIN B-12 1000 MCG PO TABS Oral Take 1,000 mcg by mouth daily.    Marland Kitchen VITAMIN C 500 MG PO TABS Oral Take 500 mg by mouth daily.      BP 109/38  Pulse 53  Temp 98.4 F (36.9 C) (Oral)  Resp 18  SpO2 96%  Physical Exam  Nursing note and vitals reviewed. Constitutional: She appears  well-developed and well-nourished. No distress.  HENT:  Head: Normocephalic and atraumatic.  Right Ear: External ear normal.  Left Ear: External ear normal.  Eyes: Conjunctivae are normal. Right eye exhibits no discharge. Left eye exhibits no discharge. No scleral icterus.  Neck: Neck supple. No tracheal deviation present.  Cardiovascular: Normal rate.   Pulmonary/Chest: Effort normal. No stridor. No respiratory distress.  Musculoskeletal: She exhibits no edema.       Left shoulder: She exhibits tenderness and pain.       Left elbow: no tenderness found.       Left forearm: She exhibits no tenderness and no edema.       Left shoulder is positive for edema, pain  with ROM.  Left elbow and forearm are nontender, no edema.  Intact neurovascularly and distally.  Neurological: She is alert. Cranial nerve deficit: no gross deficits.  Skin: Skin is warm and dry. No rash noted.  Psychiatric: She has a normal mood and affect.    ED Course  Procedures (including critical care time) DIAGNOSTIC STUDIES: Oxygen Saturation is 96% on room air, normal by my interpretation.    COORDINATION OF CARE: 11:56AM- Patient informed of current plan for treatment and evaluation and agrees with plan at this time. Patient appears to have an impacted humeral fracture. Recommended that patient follow up with orthopedist. Will apply a shoulder sling and prescribe additional pain medication.  12:53PM- Performed a consult with Dr. Shelle Iron of [facility]. Patient's case was explained and discussed.   Labs Reviewed - No data to display  No results found.   1. Proximal humerus fracture       MDM  Reviewed the x-ray provided by the patient's primary care office. I've spoken with Dr. Shelle Iron regarding having the patient seen in the office for further evaluation. He recommended having the patient call the office to schedule appointment with Dr. Maylene Roes. She has had surgery by him on the shoulder in the past. Patient was provided pain relief here in the emergency department. The [employer] replaced her sling with a more appropriately sized to slightly.     I personally performed the services described in this documentation, which was scribed in my presence.  The recorded information has been reviewed and considered.   Celene Kras, MD 04/06/12 1314

## 2012-04-09 ENCOUNTER — Other Ambulatory Visit: Payer: Self-pay | Admitting: Orthopedic Surgery

## 2012-04-09 ENCOUNTER — Ambulatory Visit
Admission: RE | Admit: 2012-04-09 | Discharge: 2012-04-09 | Disposition: A | Discharge status: Home / Self Care | Payer: 59 | Source: Ambulatory Visit | Attending: Orthopedic Surgery | Admitting: Orthopedic Surgery

## 2012-04-09 DIAGNOSIS — T148XXA Other injury of unspecified body region, initial encounter: Secondary | ICD-10-CM

## 2012-04-11 ENCOUNTER — Encounter (HOSPITAL_COMMUNITY): Payer: Self-pay | Admitting: Orthopedic Surgery

## 2012-04-11 ENCOUNTER — Encounter (HOSPITAL_COMMUNITY)
Admission: RE | Disposition: A | Discharge status: Home / Self Care | Payer: Self-pay | Source: Ambulatory Visit | Attending: Internal Medicine

## 2012-04-11 ENCOUNTER — Encounter (HOSPITAL_COMMUNITY): Payer: Self-pay | Admitting: *Deleted

## 2012-04-11 ENCOUNTER — Inpatient Hospital Stay (HOSPITAL_COMMUNITY)
Admission: RE | Admit: 2012-04-11 | Discharge: 2012-04-16 | DRG: 492 | Disposition: A | Discharge status: Home / Self Care | Payer: 59 | Source: Ambulatory Visit | Attending: Internal Medicine | Admitting: Internal Medicine

## 2012-04-11 ENCOUNTER — Encounter (HOSPITAL_COMMUNITY): Payer: Self-pay | Admitting: Anesthesiology

## 2012-04-11 ENCOUNTER — Ambulatory Visit (HOSPITAL_COMMUNITY): Payer: 59

## 2012-04-11 DIAGNOSIS — Z85118 Personal history of other malignant neoplasm of bronchus and lung: Secondary | ICD-10-CM

## 2012-04-11 DIAGNOSIS — S42213A Unspecified displaced fracture of surgical neck of unspecified humerus, initial encounter for closed fracture: Principal | ICD-10-CM | POA: Diagnosis present

## 2012-04-11 DIAGNOSIS — E860 Dehydration: Secondary | ICD-10-CM | POA: Diagnosis present

## 2012-04-11 DIAGNOSIS — Z79899 Other long term (current) drug therapy: Secondary | ICD-10-CM

## 2012-04-11 DIAGNOSIS — N179 Acute kidney failure, unspecified: Secondary | ICD-10-CM | POA: Diagnosis present

## 2012-04-11 DIAGNOSIS — E871 Hypo-osmolality and hyponatremia: Secondary | ICD-10-CM | POA: Diagnosis present

## 2012-04-11 DIAGNOSIS — Z23 Encounter for immunization: Secondary | ICD-10-CM

## 2012-04-11 DIAGNOSIS — W19XXXA Unspecified fall, initial encounter: Secondary | ICD-10-CM | POA: Diagnosis present

## 2012-04-11 DIAGNOSIS — F1010 Alcohol abuse, uncomplicated: Secondary | ICD-10-CM | POA: Diagnosis present

## 2012-04-11 DIAGNOSIS — E119 Type 2 diabetes mellitus without complications: Secondary | ICD-10-CM | POA: Diagnosis present

## 2012-04-11 DIAGNOSIS — S42302A Unspecified fracture of shaft of humerus, left arm, initial encounter for closed fracture: Secondary | ICD-10-CM | POA: Diagnosis present

## 2012-04-11 DIAGNOSIS — K859 Acute pancreatitis without necrosis or infection, unspecified: Secondary | ICD-10-CM | POA: Diagnosis present

## 2012-04-11 DIAGNOSIS — E118 Type 2 diabetes mellitus with unspecified complications: Secondary | ICD-10-CM | POA: Diagnosis present

## 2012-04-11 DIAGNOSIS — I10 Essential (primary) hypertension: Secondary | ICD-10-CM | POA: Diagnosis present

## 2012-04-11 HISTORY — DX: Acute pancreatitis without necrosis or infection, unspecified: K85.90

## 2012-04-11 HISTORY — DX: Malignant (primary) neoplasm, unspecified: C80.1

## 2012-04-11 HISTORY — DX: Headache: R51

## 2012-04-11 HISTORY — DX: Anxiety disorder, unspecified: F41.9

## 2012-04-11 HISTORY — DX: Alcohol abuse, uncomplicated: F10.10

## 2012-04-11 LAB — SURGICAL PCR SCREEN: Staphylococcus aureus: POSITIVE — AB

## 2012-04-11 LAB — CBC
MCH: 32.2 pg (ref 26.0–34.0)
MCV: 94.7 fL (ref 78.0–100.0)
Platelets: 261 10*3/uL (ref 150–400)
RDW: 12.5 % (ref 11.5–15.5)

## 2012-04-11 LAB — COMPREHENSIVE METABOLIC PANEL
ALT: 15 U/L (ref 0–35)
Calcium: 10.1 mg/dL (ref 8.4–10.5)
GFR calc Af Amer: 58 mL/min — ABNORMAL LOW (ref >90)
Glucose, Bld: 388 mg/dL — ABNORMAL HIGH (ref 70–99)
Sodium: 131 mEq/L — ABNORMAL LOW (ref 135–145)
Total Protein: 7.4 g/dL (ref 6.0–8.3)

## 2012-04-11 LAB — CARDIAC PANEL(CRET KIN+CKTOT+MB+TROPI)
CK, MB: 2.9 ng/mL (ref 0.3–4.0)
Total CK: 29 U/L (ref 7–177)

## 2012-04-11 LAB — BASIC METABOLIC PANEL
BUN: 45 mg/dL — ABNORMAL HIGH (ref 6–23)
CO2: 21 mEq/L (ref 19–32)
Calcium: 10.4 mg/dL (ref 8.4–10.5)
Creatinine, Ser: 1.48 mg/dL — ABNORMAL HIGH (ref 0.50–1.10)
Glucose, Bld: 475 mg/dL — ABNORMAL HIGH (ref 70–99)

## 2012-04-11 LAB — GLUCOSE, CAPILLARY: Glucose-Capillary: 213 mg/dL — ABNORMAL HIGH (ref 70–99)

## 2012-04-11 SURGERY — OPEN REDUCTION INTERNAL FIXATION (ORIF) PROXIMAL HUMERUS FRACTURE
Anesthesia: General

## 2012-04-11 MED ORDER — INSULIN ASPART 100 UNIT/ML ~~LOC~~ SOLN
0.0000 [IU] | Freq: Every day | SUBCUTANEOUS | Status: DC
  Administered 2012-04-11: 2 [IU] via SUBCUTANEOUS
  Administered 2012-04-12 – 2012-04-13 (×2): 3 [IU] via SUBCUTANEOUS

## 2012-04-11 MED ORDER — ONDANSETRON HCL 4 MG PO TABS: 4.0000 mg | ORAL_TABLET | Freq: Four times a day (QID) | ORAL | Status: DC | PRN

## 2012-04-11 MED ORDER — ACETAMINOPHEN 650 MG RE SUPP: 650.0000 mg | Freq: Four times a day (QID) | RECTAL | Status: DC | PRN

## 2012-04-11 MED ORDER — INSULIN ASPART 100 UNIT/ML ~~LOC~~ SOLN
0.0000 [IU] | Freq: Three times a day (TID) | SUBCUTANEOUS | Status: DC
  Administered 2012-04-11: 15 [IU] via SUBCUTANEOUS
  Administered 2012-04-12: 9 [IU] via SUBCUTANEOUS
  Administered 2012-04-12: 8 [IU] via SUBCUTANEOUS
  Administered 2012-04-12: 15 [IU] via SUBCUTANEOUS
  Administered 2012-04-13: 8 [IU] via SUBCUTANEOUS
  Administered 2012-04-13: 5 [IU] via SUBCUTANEOUS
  Administered 2012-04-14: 3 [IU] via SUBCUTANEOUS
  Administered 2012-04-14: 7 [IU] via SUBCUTANEOUS
  Administered 2012-04-14 – 2012-04-15 (×3): 5 [IU] via SUBCUTANEOUS
  Administered 2012-04-16 (×2): 3 [IU] via SUBCUTANEOUS

## 2012-04-11 MED ORDER — DILTIAZEM HCL ER 180 MG PO CP24
180.0000 mg | ORAL_CAPSULE | Freq: Every day | ORAL | Status: DC
  Filled 2012-04-11: qty 1

## 2012-04-11 MED ORDER — HYDROMORPHONE HCL PF 1 MG/ML IJ SOLN
0.5000 mg | INTRAMUSCULAR | Status: DC | PRN
  Administered 2012-04-11 – 2012-04-12 (×3): 0.5 mg via INTRAVENOUS
  Filled 2012-04-11 (×4): qty 1

## 2012-04-11 MED ORDER — MIDAZOLAM HCL 2 MG/2ML IJ SOLN
1.0000 mg | INTRAMUSCULAR | Status: DC | PRN
  Administered 2012-04-11: 17:00:00 via INTRAVENOUS

## 2012-04-11 MED ORDER — INSULIN ASPART 100 UNIT/ML ~~LOC~~ SOLN
4.0000 [IU] | Freq: Three times a day (TID) | SUBCUTANEOUS | Status: DC
  Administered 2012-04-11 – 2012-04-16 (×12): 4 [IU] via SUBCUTANEOUS

## 2012-04-11 MED ORDER — ENOXAPARIN SODIUM 40 MG/0.4ML ~~LOC~~ SOLN
40.0000 mg | Freq: Every day | SUBCUTANEOUS | Status: DC
  Administered 2012-04-11 – 2012-04-15 (×5): 40 mg via SUBCUTANEOUS
  Filled 2012-04-11 (×6): qty 0.4

## 2012-04-11 MED ORDER — OXYCODONE-ACETAMINOPHEN 5-325 MG PO TABS
1.0000 | ORAL_TABLET | ORAL | Status: DC | PRN
  Administered 2012-04-11 – 2012-04-15 (×6): 1 via ORAL
  Filled 2012-04-11: qty 1
  Filled 2012-04-11: qty 2
  Filled 2012-04-11 (×3): qty 1

## 2012-04-11 MED ORDER — VITAMIN C 500 MG PO TABS
500.0000 mg | ORAL_TABLET | Freq: Every day | ORAL | Status: DC
  Administered 2012-04-11 – 2012-04-16 (×6): 500 mg via ORAL
  Filled 2012-04-11 (×6): qty 1

## 2012-04-11 MED ORDER — MUPIROCIN 2 % EX OINT
TOPICAL_OINTMENT | CUTANEOUS | Status: AC
  Administered 2012-04-11: 1 via NASAL
  Filled 2012-04-11: qty 22

## 2012-04-11 MED ORDER — LORAZEPAM 2 MG/ML IJ SOLN
0.5000 mg | INTRAMUSCULAR | Status: DC | PRN
  Administered 2012-04-12 – 2012-04-13 (×3): 0.5 mg via INTRAVENOUS
  Filled 2012-04-11 (×2): qty 1

## 2012-04-11 MED ORDER — LACTATED RINGERS IV SOLN
INTRAVENOUS | Status: DC
  Administered 2012-04-11: 17:00:00 via INTRAVENOUS

## 2012-04-11 MED ORDER — VITAMIN B-12 1000 MCG PO TABS
1000.0000 ug | ORAL_TABLET | Freq: Every day | ORAL | Status: DC
  Administered 2012-04-11 – 2012-04-16 (×6): 1000 ug via ORAL
  Filled 2012-04-11 (×6): qty 1

## 2012-04-11 MED ORDER — FENTANYL CITRATE 0.05 MG/ML IJ SOLN
50.0000 ug | INTRAMUSCULAR | Status: DC | PRN
  Administered 2012-04-11: 100 ug via INTRAVENOUS

## 2012-04-11 MED ORDER — MUPIROCIN 2 % EX OINT
TOPICAL_OINTMENT | Freq: Two times a day (BID) | CUTANEOUS | Status: DC
  Administered 2012-04-11: 23:00:00 via NASAL
  Administered 2012-04-11: 1 via NASAL
  Administered 2012-04-12 – 2012-04-15 (×8): via NASAL
  Filled 2012-04-11 (×2): qty 22

## 2012-04-11 MED ORDER — ESCITALOPRAM OXALATE 20 MG PO TABS
20.0000 mg | ORAL_TABLET | Freq: Every day | ORAL | Status: DC
  Administered 2012-04-12 – 2012-04-16 (×5): 20 mg via ORAL
  Filled 2012-04-11 (×5): qty 1

## 2012-04-11 MED ORDER — OXYCODONE HCL 5 MG PO TABS
5.0000 mg | ORAL_TABLET | ORAL | Status: DC | PRN
  Administered 2012-04-11 – 2012-04-15 (×5): 5 mg via ORAL
  Filled 2012-04-11 (×7): qty 1

## 2012-04-11 MED ORDER — ACETAMINOPHEN 10 MG/ML IV SOLN: 1000.0000 mg | Freq: Once | INTRAVENOUS | Status: DC

## 2012-04-11 MED ORDER — MIDAZOLAM HCL 2 MG/2ML IJ SOLN
INTRAMUSCULAR | Status: AC
  Filled 2012-04-11: qty 2

## 2012-04-11 MED ORDER — ONDANSETRON HCL 4 MG/2ML IJ SOLN: 4.0000 mg | Freq: Four times a day (QID) | INTRAMUSCULAR | Status: DC | PRN

## 2012-04-11 MED ORDER — FENTANYL CITRATE 0.05 MG/ML IJ SOLN
INTRAMUSCULAR | Status: AC
  Filled 2012-04-11: qty 2

## 2012-04-11 MED ORDER — SODIUM CHLORIDE 0.9 % IV SOLN
INTRAVENOUS | Status: DC
  Administered 2012-04-11: 19:00:00 via INTRAVENOUS
  Administered 2012-04-13: 1000 mL via INTRAVENOUS

## 2012-04-11 MED ORDER — ACETAMINOPHEN 10 MG/ML IV SOLN
INTRAVENOUS | Status: AC
  Filled 2012-04-11: qty 100

## 2012-04-11 MED ORDER — ACETAMINOPHEN 325 MG PO TABS
650.0000 mg | ORAL_TABLET | Freq: Four times a day (QID) | ORAL | Status: DC | PRN
  Administered 2012-04-15: 650 mg via ORAL

## 2012-04-11 SURGICAL SUPPLY — 43 items
CLOTH BEACON ORANGE TIMEOUT ST (SAFETY) ×3 IMPLANT
DRAPE INCISE IOBAN 66X45 STRL (DRAPES) ×3 IMPLANT
DRAPE U-SHAPE 47X51 STRL (DRAPES) ×3 IMPLANT
DRSG EMULSION OIL 3X3 NADH (GAUZE/BANDAGES/DRESSINGS) ×3 IMPLANT
DRSG PAD ABDOMINAL 8X10 ST (GAUZE/BANDAGES/DRESSINGS) ×3 IMPLANT
DURAPREP 26ML APPLICATOR (WOUND CARE) ×3 IMPLANT
ELECT REM PT RETURN 9FT ADLT (ELECTROSURGICAL) ×2
ELECTRODE REM PT RTRN 9FT ADLT (ELECTROSURGICAL) ×2 IMPLANT
GLOVE BIOGEL PI ORTHO PRO 7.5 (GLOVE) ×1
GLOVE BIOGEL PI ORTHO PRO SZ8 (GLOVE) ×1
GLOVE ORTHO TXT STRL SZ7.5 (GLOVE) ×3 IMPLANT
GLOVE PI ORTHO PRO STRL 7.5 (GLOVE) ×2 IMPLANT
GLOVE PI ORTHO PRO STRL SZ8 (GLOVE) ×2 IMPLANT
GLOVE SURG ORTHO 8.5 STRL (GLOVE) ×3 IMPLANT
GOWN STRL NON-REIN LRG LVL3 (GOWN DISPOSABLE) ×6 IMPLANT
GOWN STRL REIN XL XLG (GOWN DISPOSABLE) ×6 IMPLANT
KIT BASIN OR (CUSTOM PROCEDURE TRAY) ×3 IMPLANT
KIT ROOM TURNOVER OR (KITS) ×3 IMPLANT
MANIFOLD NEPTUNE II (INSTRUMENTS) ×3 IMPLANT
NDL SUT 6 .5 CRC .975X.05 MAYO (NEEDLE) ×2 IMPLANT
NEEDLE 22X1 1/2 (OR ONLY) (NEEDLE) IMPLANT
NEEDLE MAYO TAPER (NEEDLE) ×2
NS IRRIG 1000ML POUR BTL (IV SOLUTION) ×3 IMPLANT
PACK SHOULDER (CUSTOM PROCEDURE TRAY) ×3 IMPLANT
PAD ARMBOARD 7.5X6 YLW CONV (MISCELLANEOUS) ×6 IMPLANT
PASSER SUT SWANSON 36MM LOOP (INSTRUMENTS) IMPLANT
SPONGE GAUZE 4X4 12PLY (GAUZE/BANDAGES/DRESSINGS) ×3 IMPLANT
SPONGE LAP 4X18 X RAY DECT (DISPOSABLE) ×6 IMPLANT
STAPLER VISISTAT 35W (STAPLE) ×3 IMPLANT
STRIP CLOSURE SKIN 1/2X4 (GAUZE/BANDAGES/DRESSINGS) ×3 IMPLANT
SUCTION FRAZIER TIP 10 FR DISP (SUCTIONS) ×3 IMPLANT
SUT FIBERWIRE #2 38 T-5 BLUE (SUTURE) ×2
SUT MNCRL AB 4-0 PS2 18 (SUTURE) ×3 IMPLANT
SUT VIC AB 0 CT1 27 (SUTURE) ×2
SUT VIC AB 0 CT1 27XBRD ANBCTR (SUTURE) ×2 IMPLANT
SUT VIC AB 2-0 CT1 27 (SUTURE) ×4
SUT VIC AB 2-0 CT1 TAPERPNT 27 (SUTURE) ×4 IMPLANT
SUTURE FIBERWR #2 38 T-5 BLUE (SUTURE) ×2 IMPLANT
SYR CONTROL 10ML LL (SYRINGE) ×3 IMPLANT
TOWEL OR 17X24 6PK STRL BLUE (TOWEL DISPOSABLE) ×3 IMPLANT
TOWEL OR 17X26 10 PK STRL BLUE (TOWEL DISPOSABLE) ×3 IMPLANT
WATER STERILE IRR 1000ML POUR (IV SOLUTION) ×3 IMPLANT
YANKAUER SUCT BULB TIP NO VENT (SUCTIONS) ×3 IMPLANT

## 2012-04-11 NOTE — H&P (Signed)
CC: left shoulder pain s/p fall HPI: 56 y/o female fell several days ago injuring left shoulder, pt dx with left proximal humerus fracture. Pt has similar history in the right shoulder and a hemi arthroplasty was performed several years ago. Pt denies any other injuries, denies. Loc, denies numbness or tingling distally PMH: anxiety, hypertension, hx of lung CA, migraines Family: cancer hypertension, congestive heart failure Social: married, occasional ETOH, non smoker Allergies: cipro, azithromycin Meds: diltiazem, diovan, percocet, robaxin, lexapro ROS: pain with any motion or activity of the left upper extremity, denies numbess or tingling distally, otherwise ROS is negative PE: alert and appropriate 56 y/o female in mild distress due to pain Cervical spine shows full rom with cranial nerves intact 2-12 Left shoulder: mild edema and ecchysmosis, nv intact distally No other movement attempted due to known fx X-rays: worsening displacement of proximal humerus fracture Assessment: left shoulder pain due to proximal humerus fracture Plan: admit for surgical management of the left shoulder

## 2012-04-11 NOTE — Progress Notes (Signed)
Pt arrived at 1830 to room 5N13 in no acute distress (came from OR holding).  Daughter at bedside.  Pt oriented to room /unit.  VSS.  Dr. Lavera Guise in to see patient.  Sling applied to left arm by Ortho tech.  CBG 392.  Pt covered with 19 units total (SSI and meal coverage).  Pt also medicated for pain level of "8".

## 2012-04-11 NOTE — Progress Notes (Signed)
Patient to go to 5 North bed 13, report called to nurse.  Transported via Proofreader after Dr. Ranell Patrick talked to patient.

## 2012-04-11 NOTE — H&P (Signed)
[facility] History and Physical  [name] ZOX:[id number] DOB: October 22, 1955 DOA: 04/11/2012   PCP: Maria Boys, MD   Chief Complaint:  Uncontrolled hyperglycemia  HPI:  56 year old woman with history of hypertension presented today to the operating room for a left shoulder repair. The patient fell 7 days prior to admission. She was scheduled today for left shoulder repair for her humeral fracture. In the postoperative area she was found to be with acute renal insufficiency, hyperglycemia, hyponatremia all  of which are new findings for the patient. [name] reports that since her fracture she's been feeling poorly and been unable to eat that well. She's been in a great deal of pain. She says she stopped drinking one week ago  Review of Systems:  Aspirin history of present illness, otherwise patient denies any chest pain, denies dyspnea on exertion, denies focal weakness and numbness All other systems reviewed and per history of present illness or negative  Past Medical History  Diagnosis Date  . Hypertension   . Alcohol abuse   . Pancreatitis, acute    Past Surgical History  Procedure Date  . Appendectomy   . Lung lobectomy   . Cesarean section   . Joint replacement     rt shoulder   Social History:  reports that she has never smoked. She does not have any smokeless tobacco history on file. She reports that she drinks alcohol. She reports that she does not use illicit drugs. Patient lives at home with her husband and she is normally independent with her ADLs Allergies  Allergen Reactions  . Ciprofloxacin Diarrhea    Family history positive for hypertension   Prior to Admission medications   Medication Sig Start Date End Date Taking? Authorizing Provider  diltiazem (DILACOR XR) 180 MG 24 hr capsule Take 180 mg by mouth daily.   Yes Historical Provider, MD  escitalopram (LEXAPRO) 20 MG tablet Take 20 mg by mouth daily.   Yes Historical Provider, MD  naproxen  (NAPROSYN) 500 MG tablet Take 1 tablet (500 mg total) by mouth 2 (two) times daily. 04/06/12 04/06/13 Yes Maria Kras, MD  oxyCODONE-acetaminophen (PERCOCET) 5-325 MG per tablet Take 1 tablet by mouth every 4 (four) hours as needed. For pain   Yes Historical Provider, MD  oxyCODONE-acetaminophen (PERCOCET) 5-325 MG per tablet Take 1-2 tablets by mouth every 6 (six) hours as needed for pain. 04/06/12 04/16/12 Yes Maria Kras, MD  valsartan-hydrochlorothiazide (DIOVAN-HCT) 160-25 MG per tablet Take 1 tablet by mouth daily.   Yes Historical Provider, MD  vitamin B-12 (CYANOCOBALAMIN) 1000 MCG tablet Take 1,000 mcg by mouth daily.   Yes Historical Provider, MD  vitamin C (ASCORBIC ACID) 500 MG tablet Take 500 mg by mouth daily.   Yes Historical Provider, MD   Physical Exam: Filed Vitals:   04/11/12 1702 04/11/12 1705 04/11/12 1706 04/11/12 1710  BP:      Pulse: 82 82 84 82  Temp:      TempSrc:      Resp: 20 20 20 20   Height:      Weight:      SpO2: 100% 100% 100% 100%     General:  Alert and oriented x3  Eyes: Pupil equal round react to light accommodation  ENT: Normal appearing external ears, no pharyngeal exudates  Neck: No JVD no carotid bruits  Cardiovascular: Regular rate and rhythm, without murmurs rubs or gallops  Respiratory: Clear to auscultation bilaterally without wheezes rhonchi crackles  Abdomen: Soft  nontender nondistended bowel sounds are present  Skin: Warm dry no suspicious rashes  Musculoskeletal: Left shoulder tender to palpation no ecchymosis no hematoma  Psychiatric: Anxious  Neurologic: Cranial nerves 2-12 intact, strength is intact, sensation is intact  Labs on Admission:  Basic Metabolic Panel:  Lab 04/11/12 1610  NA 132*  K 5.1  CL 89*  CO2 21  GLUCOSE 475*  BUN 45*  CREATININE 1.48*  CALCIUM 10.4  MG --  PHOS --   Liver Function Tests: No results found for this basename: AST:5,ALT:5,ALKPHOS:5,BILITOT:5,PROT:5,ALBUMIN:5 in the last 168  hours No results found for this basename: LIPASE:5,AMYLASE:5 in the last 168 hours No results found for this basename: AMMONIA:5 in the last 168 hours CBC:  Lab 04/11/12 1356  WBC 11.0*  NEUTROABS --  HGB 13.4  HCT 39.4  MCV 94.7  PLT 261   Cardiac Enzymes: No results found for this basename: CKTOTAL:5,CKMB:5,CKMBINDEX:5,TROPONINI:5 in the last 168 hours  BNP (last 3 results) No results found for this basename: PROBNP:3 in the last 8760 hours CBG:  Lab 04/11/12 1901 04/11/12 1706  GLUCAP 392* 427*    Radiological Exams on Admission: Dg Chest 1 View  04/11/2012  *RADIOLOGY REPORT*  Clinical Data: 56 year old female preoperative study for humerus fracture.  Hypertension.  CHEST - 1 VIEW  Comparison: 04/09/2012 and earlier.  Findings: Comminuted fracture of the proximal left humerus partially visible.  Stable lung volumes with chronic volume loss in the right hemithorax, pleural scarring, and mild elevation of the right hemidiaphragm.  Chronic post traumatic or postoperative changes to the right lateral ribs.  Mediastinal contours are stable and within normal limits.  No pneumothorax, pulmonary edema or definite effusion.  IMPRESSION: 1.  Chronic changes to the right hemithorax. No acute cardiopulmonary abnormality. 2.  Partially visible comminuted proximal left humerus fracture.  Original Report Authenticated By: [name], M.D.    EKG: Independently reviewed. Negative T waves anterolateral leads. No old EKG to compare  Assessment/Plan Principal Problem:  *ARF (acute renal failure) Active Problems:  Dehydration  DM type 2 causing complication  HTN (hypertension)  Alcohol abuse  Pancreatitis, acute  Hx of cancer of lung  Hyponatremia  Fracture of left humerus   1. Acute renal failure most likely due to dehydration, due to poor by mouth intake and usage of angiotensin receptor blocker and hydrochlorothiazide. Patient will be started intravenous fluids, medications will  BE held. We will followup urinary output and basic metabolic profile 2. Left shoulder fracture-per orthopedics 3. Hyperglycemia - raising the possibility of new-onset diabetes mellitus type 2. Hemoglobin A1c was sent. Patient was sent scale insulin and carb modified diet 4. Hypertension for now we'll use diltiazem only and followup  5. Alcohol abuse-started vitamins-we will counsel her when she is more ready  Code Status: full Family Communication: husband Disposition Plan: home   [name] [facility] Pager [phone]  If 7PM-7AM, please contact night-coverage [email or web address] Password [facility] 04/11/2012, 7:28 PM

## 2012-04-11 NOTE — Progress Notes (Signed)
OR Holding:  Surgery cancelled per Dr Ranell Patrick due to high blood glucose.  Bed requested on ortho. Floor.  Clarice Pole, RN

## 2012-04-11 NOTE — Progress Notes (Signed)
Orthopedic Tech Progress Note Patient Details:  [name] [birth year] [id number]  Ortho Devices Type of Ortho Device: Arm foam sling Ortho Device/Splint Location: (L) UE Ortho Device/Splint Interventions: Application   Jennye Moccasin 04/11/2012, 7:18 PM

## 2012-04-11 NOTE — Progress Notes (Signed)
Orthopedics Progress Note  Subjective:  Patient who has fallen and broken her left shoulder who presented today for ORIF versus hemiarthroplasty.  Upon arrival to pre-op, she was noted to have blood sugars greater than 400's. She also has other electrolyte abnormalities.  I discussed with Dr Kipp Brood of anesthesia regarding the proper care for this patient.  Our feeling is that proceeding with surgery at this time is not safe.  Surgery postponed.   Objective:  Filed Vitals:   04/11/12 1710  BP:   Pulse: 82  Temp:   Resp: 20    General: Awake and alert  Musculoskeletal: left shoulder swollen, unable to move due to pain, NVI Neurovascularly intact  Lab Results  Component Value Date   WBC 11.0* 04/11/2012   HGB 13.4 04/11/2012   HCT 39.4 04/11/2012   MCV 94.7 04/11/2012   PLT 261 04/11/2012       Component Value Date/Time   NA 132* 04/11/2012 1356   K 5.1 04/11/2012 1356   CL 89* 04/11/2012 1356   CO2 21 04/11/2012 1356   GLUCOSE 475* 04/11/2012 1356   BUN 45* 04/11/2012 1356   CREATININE 1.48* 04/11/2012 1356   CALCIUM 10.4 04/11/2012 1356   GFRNONAA 38* 04/11/2012 1356   GFRAA 44* 04/11/2012 1356    No results found for this basename: INR, PROTIME    Assessment/Plan:  LEFT PROXIMAL HUMERUS FRACTURE Diabetes, with acute electrolyte abnormality. Admission and medical work-up.   [name], MD 04/11/2012 5:48 PM

## 2012-04-12 ENCOUNTER — Encounter (HOSPITAL_COMMUNITY): Payer: Self-pay | Admitting: General Practice

## 2012-04-12 LAB — GLUCOSE, CAPILLARY
Glucose-Capillary: 244 mg/dL — ABNORMAL HIGH (ref 70–99)
Glucose-Capillary: 308 mg/dL — ABNORMAL HIGH (ref 70–99)
Glucose-Capillary: 371 mg/dL — ABNORMAL HIGH (ref 70–99)

## 2012-04-12 LAB — BASIC METABOLIC PANEL
CO2: 21 mEq/L (ref 19–32)
Chloride: 95 mEq/L — ABNORMAL LOW (ref 96–112)
Creatinine, Ser: 0.97 mg/dL (ref 0.50–1.10)
GFR calc Af Amer: 74 mL/min — ABNORMAL LOW (ref >90)
Potassium: 4.1 mEq/L (ref 3.5–5.1)

## 2012-04-12 LAB — CBC
HCT: 35.6 % — ABNORMAL LOW (ref 36.0–46.0)
Hemoglobin: 12.2 g/dL (ref 12.0–15.0)
MCV: 93.7 fL (ref 78.0–100.0)
WBC: 6.7 10*3/uL (ref 4.0–10.5)

## 2012-04-12 LAB — HEMOGLOBIN A1C
Hgb A1c MFr Bld: 14.5 % — ABNORMAL HIGH (ref <5.7)
Mean Plasma Glucose: 369 mg/dL — ABNORMAL HIGH (ref <117)

## 2012-04-12 MED ORDER — LIVING WELL WITH DIABETES BOOK
Freq: Once | Status: AC
  Administered 2012-04-12: 11:00:00
  Filled 2012-04-12: qty 1

## 2012-04-12 MED ORDER — BD GETTING STARTED TAKE HOME KIT: 3/10ML X 30G SYRINGES
1.0000 | Freq: Once | Status: DC
  Filled 2012-04-12: qty 1

## 2012-04-12 MED ORDER — INSULIN GLARGINE 100 UNIT/ML ~~LOC~~ SOLN
10.0000 [IU] | Freq: Every day | SUBCUTANEOUS | Status: DC
  Administered 2012-04-12 – 2012-04-14 (×3): 10 [IU] via SUBCUTANEOUS

## 2012-04-12 MED ORDER — BD GETTING STARTED TAKE HOME KIT: 1/2ML X 30G SYRINGES
1.0000 | Freq: Once | Status: DC
  Filled 2012-04-12: qty 1

## 2012-04-12 MED ORDER — PNEUMOCOCCAL VAC POLYVALENT 25 MCG/0.5ML IJ INJ
0.5000 mL | INJECTION | INTRAMUSCULAR | Status: AC
  Administered 2012-04-13: 0.5 mL via INTRAMUSCULAR
  Filled 2012-04-12: qty 0.5

## 2012-04-12 MED ORDER — LORAZEPAM 0.5 MG PO TABS
0.5000 mg | ORAL_TABLET | ORAL | Status: DC | PRN
  Administered 2012-04-14 (×3): 0.5 mg via ORAL
  Filled 2012-04-12 (×3): qty 1

## 2012-04-12 MED ORDER — HYDROMORPHONE HCL PF 1 MG/ML IJ SOLN
1.0000 mg | INTRAMUSCULAR | Status: DC | PRN
  Administered 2012-04-12 – 2012-04-15 (×20): 1 mg via INTRAVENOUS
  Filled 2012-04-12 (×19): qty 1

## 2012-04-12 NOTE — Progress Notes (Signed)
[facility] PROGRESS NOTE  [name] ZOX:[id number] DOB: 1956/08/05 DOA: 04/11/2012 PCP: Lenora Boys, MD  Assessment/Plan: Principal Problem:  *ARF (acute renal failure) Active Problems:  Dehydration  DM type 2 causing complication  HTN (hypertension)  Alcohol abuse  Pancreatitis, acute  Hx of cancer of lung  Hyponatremia  Fracture of left humerus  1. ARF - due to poor by mouth intake, outpatient use of hydrochlorothiazide and anteroseptal blocker . Started on iv fluids from the day of admission , holding arb, -creatinine improved 2. New onset DM2 -patient has a hemoglobin A1c of 14. We shall treat with insulin for now. In the long run the patient should start a program of diet exercise and titration up of oral meds 3. Alcohol abuse-counseled 4. Shoulder fracture for OR tomorrow - optimize pain control  Code Status: Full Family Communication: Daughter at bedside Disposition Plan: Home     HPI/Subjective: Complaining of the shoulder pain  Objective: Filed Vitals:   04/11/12 2247 04/12/12 0500 04/12/12 1003 04/12/12 1326  BP: 115/52 120/54 102/63 109/61  Pulse: 76 75 86 95  Temp: 98.8 F (37.1 C) 98.2 F (36.8 C)  98.3 F (36.8 C)  TempSrc:      Resp: 16 18  16   Height:      Weight:      SpO2: 95% 96%  98%    Intake/Output Summary (Last 24 hours) at 04/12/12 1558 Last data filed at 04/12/12 1300  Gross per 24 hour  Intake    361 ml  Output      0 ml  Net    361 ml    Exam:   General:  Alert and oriented x3  Cardiovascular: Regular rate and rhythm without murmurs rubs or gallops  Respiratory: Clear to auscultation bilaterally  Abdomen: Soft nontender  Data Reviewed: Basic Metabolic Panel:  Lab 04/12/12 8119 04/11/12 1900 04/11/12 1356  NA 133* 131* 132*  K 4.1 4.3 5.1  CL 95* 90* 89*  CO2 21 20 21   GLUCOSE 284* 388* 475*  BUN 43* 44* 45*  CREATININE 0.97 1.18* 1.48*  CALCIUM 9.6 10.1 10.4  MG -- -- --  PHOS -- -- --    Liver Function Tests:  Lab 04/11/12 1900  AST 12  ALT 15  ALKPHOS 96  BILITOT 0.9  PROT 7.4  ALBUMIN 3.4*   No results found for this basename: LIPASE:5,AMYLASE:5 in the last 168 hours No results found for this basename: AMMONIA:5 in the last 168 hours CBC:  Lab 04/12/12 0626 04/11/12 1356  WBC 6.7 11.0*  NEUTROABS -- --  HGB 12.2 13.4  HCT 35.6* 39.4  MCV 93.7 94.7  PLT 235 261   Cardiac Enzymes:  Lab 04/11/12 1900  CKTOTAL 29  CKMB 2.9  CKMBINDEX --  TROPONINI <0.30   BNP (last 3 results) No results found for this basename: PROBNP:3 in the last 8760 hours CBG:  Lab 04/12/12 1110 04/12/12 0635 04/11/12 2235 04/11/12 1901 04/11/12 1706  GLUCAP 308* 244* 213* 392* 427*    Recent Results (from the past 240 hour(s))  SURGICAL PCR SCREEN     Status: Abnormal   Collection Time   04/11/12  1:55 PM      Component Value Range Status Comment   MRSA, PCR NEGATIVE  NEGATIVE Final    Staphylococcus aureus POSITIVE (*) NEGATIVE Final     A1C 14.5  Studies: Dg Chest 1 View  04/11/2012  *RADIOLOGY REPORT*  Clinical Data: 56 year old female preoperative study  for humerus fracture.  Hypertension.  CHEST - 1 VIEW  Comparison: 04/09/2012 and earlier.  Findings: Comminuted fracture of the proximal left humerus partially visible.  Stable lung volumes with chronic volume loss in the right hemithorax, pleural scarring, and mild elevation of the right hemidiaphragm.  Chronic post traumatic or postoperative changes to the right lateral ribs.  Mediastinal contours are stable and within normal limits.  No pneumothorax, pulmonary edema or definite effusion.  IMPRESSION: 1.  Chronic changes to the right hemithorax. No acute cardiopulmonary abnormality. 2.  Partially visible comminuted proximal left humerus fracture.  Original Report Authenticated By: Harley Hallmark, M.D.   Ct Shoulder Left Wo Contrast  04/09/2012  *RADIOLOGY REPORT*  Clinical Data: Fall 04/05/2012 with shoulder fracture.   CT OF THE LEFT SHOULDER WITHOUT CONTRAST  Technique:  Multidetector CT imaging was performed according to the standard protocol. Multiplanar CT image reconstructions were also generated.  Comparison: Plain films of the left humerus 04/06/2012.  Findings: As seen on patient's plain films, there is an impacted surgical neck fracture of left humerus with fragment override of approximately 2.5 cm.  The fracture is comminuted with the lesser tuberosity a separate fragment.  The fracture also extends into the anterior aspect of the greater tuberosity.  The humeral head is located.  The acromioclavicular joint is intact.  No other fracture is identified.  Soft tissue swelling and hematoma about the fracture is noted.  Imaged lung parenchyma is unremarkable.  IMPRESSION: Impacted surgical neck fracture of the left humerus involves both the greater and lesser tuberosities.  Original Report Authenticated By: Bernadene Bell. D'ALESSIO, M.D.    Scheduled Meds:    . enoxaparin (LOVENOX) injection  40 mg Subcutaneous QHS  . escitalopram  20 mg Oral Daily  . insulin aspart  0-15 Units Subcutaneous TID WC  . insulin aspart  0-5 Units Subcutaneous QHS  . insulin aspart  4 Units Subcutaneous TID WC  . insulin glargine  10 Units Subcutaneous Daily  . living well with diabetes book   Does not apply Once  . mupirocin ointment   Nasal BID  . vitamin B-12  1,000 mcg Oral Daily  . vitamin C  500 mg Oral Daily  . DISCONTD: acetaminophen  1,000 mg Intravenous Once  . DISCONTD: diltiazem  180 mg Oral Daily   Continuous Infusions:    . sodium chloride 50 mL/hr at 04/11/12 1900  . DISCONTD: lactated ringers 50 mL/hr at 04/11/12 1659      Wyndham Santilli  [facility] Pager [phone]. If 8PM-8AM, please contact night-coverage at [email or web address], password [facility] 04/12/2012, 3:58 PM  LOS: 1 day

## 2012-04-12 NOTE — Progress Notes (Signed)
Patient admitted for humeral fracture.  Found to be hyperglycemic on admit.  Diagnosed with diabetes this admission.  A1c 14.5% (04/11/12).  Spoke with pt about new diagnosis.  Discussed A1C results with her and explained what an A1C is, basic pathophysiology of DM Type 2, basic home care, importance of checking CBGs and maintaining good CBG control to prevent long-term and short-term complications.  Reviewed signs and symptoms of hyperglycemia and hypoglycemia.  RNs to provide ongoing basic DM education at bedside with this patient.  Have ordered educational booklet, insulin starter kit, and DM videos.  RD consult already completed.  Diet education provided by RD today.  Patient appeared slightly overwhelmed but receptive to information.  Also reviewed ADA Standards of care with patient.  Reminded patient about the importance of good BP and lipid control in addition to yearly eye exams, labs, etc.  Patient sees Dr. Marinda Elk as an outpatient.  Encouraged patient to follow up with Dr. Foy Guadalajara after d/c.  Will place referral to the [facility].  Patient expressed interest in attending DM classes after d/c for further education.  Will follow. Ambrose Finland RN, MSN, CDE Diabetes Coordinator Inpatient Diabetes Program [phone]

## 2012-04-12 NOTE — Plan of Care (Signed)
Problem: Phase I Progression Outcomes Goal: NPO or per MD order Outcome: Completed/Met Date Met:  04/12/12 Pt ordered carb modified diet

## 2012-04-12 NOTE — Progress Notes (Signed)
Nutrition Education Note  RD consulted for nutrition education regarding diabetes.   Lab Results  Component Value Date   HGBA1C 14.5* 04/11/2012    RD provided "Carbohydrate Counting for Diabetes" handout from the Academy of [employer] and Dietetics. Discussed different food groups and their effects on blood sugar, emphasizing carbohydrate-containing foods. Provided list of carbohydrates and recommended serving sizes of common foods.  Discussed importance of controlled and consistent carbohydrate intake throughout the day. Discussed currnet intake regimen which for pt is typically 2 meals/day. Provided examples of ways to balance meals/snacks and encouraged intake of high-fiber, whole grain complex carbohydrates.  Daughter at bedside is without questions at this time.  Expect good compliance.  Body mass index is 26.61 kg/(m^2). Pt meets criteria for Obese based on current BMI.  Current diet order is CHO Modified, patient is consuming approximately 100% of meals at this time. Labs and medications reviewed. No further nutrition interventions warranted at this time. If additional nutrition issues arise, please re-consult RD.  Loyce Dys, MS RD LDN Clinical Inpatient Dietitian Pager: [phone] Weekend/After hours pager: [phone]

## 2012-04-13 ENCOUNTER — Inpatient Hospital Stay (HOSPITAL_COMMUNITY): Payer: 59 | Admitting: Anesthesiology

## 2012-04-13 ENCOUNTER — Encounter (HOSPITAL_COMMUNITY)
Admission: RE | Disposition: A | Discharge status: Home / Self Care | Payer: Self-pay | Source: Ambulatory Visit | Attending: Internal Medicine

## 2012-04-13 ENCOUNTER — Encounter (HOSPITAL_COMMUNITY): Payer: Self-pay | Admitting: Anesthesiology

## 2012-04-13 ENCOUNTER — Ambulatory Visit: Admit: 2012-04-13 | Payer: Self-pay | Admitting: Orthopedic Surgery

## 2012-04-13 HISTORY — PX: ORIF HUMERUS FRACTURE: SHX2126

## 2012-04-13 LAB — GLUCOSE, CAPILLARY
Glucose-Capillary: 253 mg/dL — ABNORMAL HIGH (ref 70–99)
Glucose-Capillary: 293 mg/dL — ABNORMAL HIGH (ref 70–99)

## 2012-04-13 LAB — BASIC METABOLIC PANEL
BUN: 33 mg/dL — ABNORMAL HIGH (ref 6–23)
CO2: 19 mEq/L (ref 19–32)
GFR calc non Af Amer: >90 mL/min (ref >90)
Glucose, Bld: 226 mg/dL — ABNORMAL HIGH (ref 70–99)
Potassium: 3.7 mEq/L (ref 3.5–5.1)
Sodium: 134 mEq/L — ABNORMAL LOW (ref 135–145)

## 2012-04-13 LAB — CBC
HCT: 36 % (ref 36.0–46.0)
Hemoglobin: 12.4 g/dL (ref 12.0–15.0)
MCH: 32.5 pg (ref 26.0–34.0)
MCHC: 34.4 g/dL (ref 30.0–36.0)
MCV: 94.2 fL (ref 78.0–100.0)
RBC: 3.82 MIL/uL — ABNORMAL LOW (ref 3.87–5.11)

## 2012-04-13 SURGERY — OPEN REDUCTION INTERNAL FIXATION (ORIF) PROXIMAL HUMERUS FRACTURE
Anesthesia: General | Site: Shoulder | Laterality: Left | Wound class: Clean

## 2012-04-13 MED ORDER — LACTATED RINGERS IV SOLN
INTRAVENOUS | Status: DC | PRN
  Administered 2012-04-13 (×2): via INTRAVENOUS

## 2012-04-13 MED ORDER — ONDANSETRON HCL 4 MG PO TABS: 4.0000 mg | ORAL_TABLET | Freq: Four times a day (QID) | ORAL | Status: DC | PRN

## 2012-04-13 MED ORDER — METHOCARBAMOL 500 MG PO TABS
500.0000 mg | ORAL_TABLET | Freq: Four times a day (QID) | ORAL | Status: DC | PRN
  Administered 2012-04-13 – 2012-04-16 (×8): 500 mg via ORAL
  Filled 2012-04-13 (×8): qty 1

## 2012-04-13 MED ORDER — HYDROMORPHONE HCL PF 1 MG/ML IJ SOLN
0.2500 mg | INTRAMUSCULAR | Status: DC | PRN
  Administered 2012-04-13: 0.5 mg via INTRAVENOUS

## 2012-04-13 MED ORDER — HYDROMORPHONE HCL PF 1 MG/ML IJ SOLN
0.5000 mg | INTRAMUSCULAR | Status: DC | PRN
  Administered 2012-04-13 – 2012-04-15 (×2): 1 mg via INTRAVENOUS
  Filled 2012-04-13 (×3): qty 1

## 2012-04-13 MED ORDER — PHENYLEPHRINE HCL 10 MG/ML IJ SOLN
INTRAMUSCULAR | Status: DC | PRN
  Administered 2012-04-13 (×3): 80 ug via INTRAVENOUS

## 2012-04-13 MED ORDER — ACETAMINOPHEN 325 MG PO TABS
650.0000 mg | ORAL_TABLET | Freq: Four times a day (QID) | ORAL | Status: DC | PRN
  Filled 2012-04-13: qty 2

## 2012-04-13 MED ORDER — DROPERIDOL 2.5 MG/ML IJ SOLN
0.6250 mg | INTRAMUSCULAR | Status: DC | PRN
  Filled 2012-04-13: qty 0.25

## 2012-04-13 MED ORDER — MENTHOL 3 MG MT LOZG: 1.0000 | LOZENGE | OROMUCOSAL | Status: DC | PRN

## 2012-04-13 MED ORDER — ALBUMIN HUMAN 5 % IV SOLN
INTRAVENOUS | Status: DC | PRN
  Administered 2012-04-13: 13:00:00 via INTRAVENOUS

## 2012-04-13 MED ORDER — OXYCODONE-ACETAMINOPHEN 5-325 MG PO TABS
1.0000 | ORAL_TABLET | ORAL | Status: DC | PRN
  Administered 2012-04-13 – 2012-04-15 (×4): 2 via ORAL
  Filled 2012-04-13: qty 1
  Filled 2012-04-13 (×5): qty 2

## 2012-04-13 MED ORDER — ROCURONIUM BROMIDE 100 MG/10ML IV SOLN
INTRAVENOUS | Status: DC | PRN
  Administered 2012-04-13: 50 mg via INTRAVENOUS

## 2012-04-13 MED ORDER — MIDAZOLAM HCL 5 MG/5ML IJ SOLN
INTRAMUSCULAR | Status: DC | PRN
  Administered 2012-04-13 (×2): 1 mg via INTRAVENOUS

## 2012-04-13 MED ORDER — SODIUM CHLORIDE 0.9 % IV SOLN: INTRAVENOUS | Status: DC

## 2012-04-13 MED ORDER — METOCLOPRAMIDE HCL 10 MG PO TABS
5.0000 mg | ORAL_TABLET | Freq: Three times a day (TID) | ORAL | Status: DC | PRN
  Filled 2012-04-13: qty 1

## 2012-04-13 MED ORDER — ONDANSETRON HCL 4 MG/2ML IJ SOLN: 4.0000 mg | Freq: Four times a day (QID) | INTRAMUSCULAR | Status: DC | PRN

## 2012-04-13 MED ORDER — METHOCARBAMOL 100 MG/ML IJ SOLN
500.0000 mg | Freq: Four times a day (QID) | INTRAVENOUS | Status: DC | PRN
  Filled 2012-04-13: qty 5

## 2012-04-13 MED ORDER — BUPIVACAINE-EPINEPHRINE PF 0.25-1:200000 % IJ SOLN
INTRAMUSCULAR | Status: DC | PRN
  Administered 2012-04-13: 8.5 mL

## 2012-04-13 MED ORDER — METOCLOPRAMIDE HCL 5 MG/ML IJ SOLN: 5.0000 mg | Freq: Three times a day (TID) | INTRAMUSCULAR | Status: DC | PRN

## 2012-04-13 MED ORDER — EPHEDRINE SULFATE 50 MG/ML IJ SOLN
INTRAMUSCULAR | Status: DC | PRN
  Administered 2012-04-13: 5 mg via INTRAVENOUS

## 2012-04-13 MED ORDER — CEFAZOLIN SODIUM-DEXTROSE 2-3 GM-% IV SOLR
2.0000 g | Freq: Four times a day (QID) | INTRAVENOUS | Status: AC
  Administered 2012-04-13 – 2012-04-14 (×3): 2 g via INTRAVENOUS
  Filled 2012-04-13 (×3): qty 50

## 2012-04-13 MED ORDER — LACTATED RINGERS IV SOLN
INTRAVENOUS | Status: DC
  Administered 2012-04-13: 12:00:00 via INTRAVENOUS

## 2012-04-13 MED ORDER — PROPOFOL 10 MG/ML IV EMUL
INTRAVENOUS | Status: DC | PRN
  Administered 2012-04-13: 190 mg via INTRAVENOUS

## 2012-04-13 MED ORDER — FENTANYL CITRATE 0.05 MG/ML IJ SOLN
INTRAMUSCULAR | Status: DC | PRN
  Administered 2012-04-13: 50 ug via INTRAVENOUS
  Administered 2012-04-13 (×2): 25 ug via INTRAVENOUS
  Administered 2012-04-13: 50 ug via INTRAVENOUS
  Administered 2012-04-13: 25 ug via INTRAVENOUS
  Administered 2012-04-13: 100 ug via INTRAVENOUS
  Administered 2012-04-13: 50 ug via INTRAVENOUS
  Administered 2012-04-13: 25 ug via INTRAVENOUS

## 2012-04-13 MED ORDER — GLYCOPYRROLATE 0.2 MG/ML IJ SOLN
INTRAMUSCULAR | Status: DC | PRN
  Administered 2012-04-13: .7 mg via INTRAVENOUS

## 2012-04-13 MED ORDER — PHENOL 1.4 % MT LIQD: 1.0000 | OROMUCOSAL | Status: DC | PRN

## 2012-04-13 MED ORDER — NEOSTIGMINE METHYLSULFATE 1 MG/ML IJ SOLN
INTRAMUSCULAR | Status: DC | PRN
  Administered 2012-04-13: 4 mg via INTRAVENOUS

## 2012-04-13 MED ORDER — CEFAZOLIN SODIUM 1-5 GM-% IV SOLN
INTRAVENOUS | Status: DC | PRN
  Administered 2012-04-13: 2 g via INTRAVENOUS

## 2012-04-13 MED ORDER — ONDANSETRON HCL 4 MG/2ML IJ SOLN
INTRAMUSCULAR | Status: DC | PRN
  Administered 2012-04-13: 4 mg via INTRAVENOUS

## 2012-04-13 MED ORDER — 0.9 % SODIUM CHLORIDE (POUR BTL) OPTIME
TOPICAL | Status: DC | PRN
  Administered 2012-04-13: 1000 mL

## 2012-04-13 MED ORDER — ACETAMINOPHEN 650 MG RE SUPP: 650.0000 mg | Freq: Four times a day (QID) | RECTAL | Status: DC | PRN

## 2012-04-13 SURGICAL SUPPLY — 64 items
BIT DRILL 4 LONG FAST STEP (BIT) ×1 IMPLANT
BIT DRILL 4 SHORT FAST STEP (BIT) ×1 IMPLANT
BIT DRILL SNP 4.0MM (BIT) IMPLANT
CLOTH BEACON ORANGE TIMEOUT ST (SAFETY) ×2 IMPLANT
CLSR STERI-STRIP ANTIMIC 1/2X4 (GAUZE/BANDAGES/DRESSINGS) ×1 IMPLANT
DRAPE INCISE IOBAN 66X45 STRL (DRAPES) ×2 IMPLANT
DRAPE U-SHAPE 47X51 STRL (DRAPES) ×2 IMPLANT
DRILL BIT SNP 4.0MM (BIT) ×1
DRSG ADAPTIC 3X8 NADH LF (GAUZE/BANDAGES/DRESSINGS) ×1 IMPLANT
DRSG EMULSION OIL 3X3 NADH (GAUZE/BANDAGES/DRESSINGS) ×2 IMPLANT
DRSG MEPILEX BORDER 4X8 (GAUZE/BANDAGES/DRESSINGS) ×1 IMPLANT
DRSG PAD ABDOMINAL 8X10 ST (GAUZE/BANDAGES/DRESSINGS) ×2 IMPLANT
DURAPREP 26ML APPLICATOR (WOUND CARE) ×2 IMPLANT
ELECT REM PT RETURN 9FT ADLT (ELECTROSURGICAL) ×2
ELECTRODE REM PT RTRN 9FT ADLT (ELECTROSURGICAL) ×1 IMPLANT
GLOVE BIOGEL PI ORTHO PRO 7.5 (GLOVE) ×1
GLOVE BIOGEL PI ORTHO PRO SZ8 (GLOVE) ×1
GLOVE ORTHO TXT STRL SZ7.5 (GLOVE) ×2 IMPLANT
GLOVE PI ORTHO PRO STRL 7.5 (GLOVE) ×1 IMPLANT
GLOVE PI ORTHO PRO STRL SZ8 (GLOVE) ×1 IMPLANT
GLOVE SURG ORTHO 8.5 STRL (GLOVE) ×2 IMPLANT
GOWN STRL NON-REIN LRG LVL3 (GOWN DISPOSABLE) ×4 IMPLANT
GOWN STRL REIN XL XLG (GOWN DISPOSABLE) ×4 IMPLANT
K-WIRE 2X5 SS THRDED S3 (WIRE) ×2
KIT BASIN OR (CUSTOM PROCEDURE TRAY) ×2 IMPLANT
KIT ROOM TURNOVER OR (KITS) ×2 IMPLANT
KWIRE 2X5 SS THRDED S3 (WIRE) IMPLANT
MANIFOLD NEPTUNE II (INSTRUMENTS) ×2 IMPLANT
NDL SUT 6 .5 CRC .975X.05 MAYO (NEEDLE) ×1 IMPLANT
NEEDLE 22X1 1/2 (OR ONLY) (NEEDLE) ×1 IMPLANT
NEEDLE MAYO TAPER (NEEDLE) ×2
NS IRRIG 1000ML POUR BTL (IV SOLUTION) ×2 IMPLANT
PACK SHOULDER (CUSTOM PROCEDURE TRAY) ×2 IMPLANT
PAD ARMBOARD 7.5X6 YLW CONV (MISCELLANEOUS) ×4 IMPLANT
PASSER SUT SWANSON 36MM LOOP (INSTRUMENTS) IMPLANT
PEG STND 4.0X20.0MM (Orthopedic Implant) ×2 IMPLANT
PEG STND 4.0X25.0MM (Orthopedic Implant) ×3 IMPLANT
PEG STND 4.0X32.5MM (Orthopedic Implant) ×2 IMPLANT
PEG STND 4.0X35MM (Orthopedic Implant) ×4 IMPLANT
PEG STND 4.0X40MM (Orthopedic Implant) ×2 IMPLANT
PEGSTD 4.0X20.0MM (Orthopedic Implant) IMPLANT
PEGSTD 4.0X32.5MM (Orthopedic Implant) IMPLANT
PEGSTD 4.0X35MM (Orthopedic Implant) IMPLANT
PEGSTD 4.0X40MM (Orthopedic Implant) IMPLANT
PLATE SZ3 SHOULDER NAIL SNP (Plate) ×1 IMPLANT
SCREW SNP UNICORTICAL (Screw) ×3 IMPLANT
SLING ARM FOAM STRAP LRG (SOFTGOODS) ×1 IMPLANT
SPONGE GAUZE 4X4 12PLY (GAUZE/BANDAGES/DRESSINGS) ×2 IMPLANT
SPONGE LAP 4X18 X RAY DECT (DISPOSABLE) ×4 IMPLANT
STAPLER VISISTAT 35W (STAPLE) ×2 IMPLANT
STRIP CLOSURE SKIN 1/2X4 (GAUZE/BANDAGES/DRESSINGS) ×2 IMPLANT
SUCTION FRAZIER TIP 10 FR DISP (SUCTIONS) ×2 IMPLANT
SUT FIBERWIRE #2 38 T-5 BLUE (SUTURE) ×6
SUT MNCRL AB 4-0 PS2 18 (SUTURE) ×2 IMPLANT
SUT VIC AB 0 CT1 27 (SUTURE) ×2
SUT VIC AB 0 CT1 27XBRD ANBCTR (SUTURE) ×1 IMPLANT
SUT VIC AB 2-0 CT1 27 (SUTURE) ×4
SUT VIC AB 2-0 CT1 TAPERPNT 27 (SUTURE) ×2 IMPLANT
SUTURE FIBERWR #2 38 T-5 BLUE (SUTURE) ×1 IMPLANT
SYR CONTROL 10ML LL (SYRINGE) ×2 IMPLANT
TOWEL OR 17X24 6PK STRL BLUE (TOWEL DISPOSABLE) ×2 IMPLANT
TOWEL OR 17X26 10 PK STRL BLUE (TOWEL DISPOSABLE) ×2 IMPLANT
WATER STERILE IRR 1000ML POUR (IV SOLUTION) ×2 IMPLANT
YANKAUER SUCT BULB TIP NO VENT (SUCTIONS) ×2 IMPLANT

## 2012-04-13 NOTE — Progress Notes (Signed)
Orthopedics Progress Note  Subjective: I am ready for surgery.  Objective:  Filed Vitals:   04/13/12 0624  BP: 112/86  Pulse: 67  Temp: 97.9 F (36.6 C)  Resp: 18    General: Awake and alert  Musculoskeletal: Left shoulder in sling, mod swollen, limited ROM due to pain Neurovascularly intact  Lab Results  Component Value Date   WBC 4.6 04/13/2012   HGB 12.4 04/13/2012   HCT 36.0 04/13/2012   MCV 94.2 04/13/2012   PLT 231 04/13/2012       Component Value Date/Time   NA 133* 04/12/2012 0626   K 4.1 04/12/2012 0626   CL 95* 04/12/2012 0626   CO2 21 04/12/2012 0626   GLUCOSE 284* 04/12/2012 0626   BUN 43* 04/12/2012 0626   CREATININE 0.97 04/12/2012 0626   CALCIUM 9.6 04/12/2012 0626   GFRNONAA 64* 04/12/2012 0626   GFRAA 74* 04/12/2012 0626    No results found for this basename: INR, PROTIME    Assessment/Plan: PLAN: OPEN REDUCTION INTERNAL FIXATION (ORIF) PROXIMAL HUMERUS FRACTURE TODAY Aprreciate excellent medical care and optimization of ARF and DM  Steven R. [place] Spainhower, MD 04/13/2012 7:15 AM     

## 2012-04-13 NOTE — Preoperative (Signed)
Beta Blockers   Reason not to administer Beta Blockers:Not Applicable. No home beta blockers 

## 2012-04-13 NOTE — Anesthesia Preprocedure Evaluation (Signed)
Anesthesia Evaluation  Patient identified by MRN, date of birth, ID band Patient awake    Reviewed: Allergy & Precautions, H&P , NPO status , Patient's Chart, lab work & pertinent test results, reviewed documented beta blocker date and time   Airway Mallampati: II      Dental  (+) Edentulous Upper and Dental Advisory Given   Pulmonary    Pulmonary exam normal       Cardiovascular hypertension, Pt. on medications     Neuro/Psych  Headaches, Anxiety    GI/Hepatic   Endo/Other  Type 2  Renal/GU      Musculoskeletal   Abdominal   Peds  Hematology   Anesthesia Other Findings   Reproductive/Obstetrics                           Anesthesia Physical Anesthesia Plan  ASA: II  Anesthesia Plan: General   Post-op Pain Management:    Induction: Intravenous  Airway Management Planned: Oral ETT  Additional Equipment:   Intra-op Plan:   Post-operative Plan: Extubation in OR  Informed Consent: I have reviewed the patients History and Physical, chart, labs and discussed the procedure including the risks, benefits and alternatives for the proposed anesthesia with the patient or authorized representative who has indicated his/her understanding and acceptance.   Dental advisory given and History available from chart only  Plan Discussed with: CRNA and Anesthesiologist  Anesthesia Plan Comments:         Anesthesia Quick Evaluation

## 2012-04-13 NOTE — Progress Notes (Signed)
[facility] PROGRESS NOTE  KLAIR LEISING ZOX:[id number] DOB: 04/08/1956 DOA: 04/11/2012 PCP: Lenora Boys, MD  Assessment/Plan: Principal Problem:  *ARF (acute renal failure) Active Problems:  Dehydration  DM type 2 causing complication  HTN (hypertension)  Alcohol abuse  Pancreatitis, acute  Hx of cancer of lung  Hyponatremia  Fracture of left humerus  1. ARF - due to poor by mouth intake, outpatient use of hydrochlorothiazide and angiotensin receptor  blocker . Started on iv fluids from the day of admission , holding arb, -creatinine improved 2. New onset DM2 -patient has a hemoglobin A1c of 14. We shall treat with insulin for now. In the long run the patient should start a program of diet exercise and titration up of oral meds 3. Alcohol abuse-counseled 4. Shoulder fracture for OR tomorrow - optimize pain control  Code Status: Full Family Communication: Daughter at bedside Disposition Plan: Home     HPI/Subjective: Just returned from surgery  Objective: Filed Vitals:   04/12/12 1326 04/12/12 2148 04/13/12 0246 04/13/12 0624  BP: 109/61 113/79 117/63 112/86  Pulse: 95 77 65 67  Temp: 98.3 F (36.8 C) 98.2 F (36.8 C) 97.9 F (36.6 C) 97.9 F (36.6 C)  TempSrc:      Resp: 16 18 18 18   Height:      Weight:      SpO2: 98% 100% 100% 98%    Intake/Output Summary (Last 24 hours) at 04/13/12 1039 Last data filed at 04/12/12 1500  Gross per 24 hour  Intake    241 ml  Output      0 ml  Net    241 ml    Exam:   Alert and oriented x3  Chest clear to auscultation  Heart regular rate and rhythm  Data Reviewed: Basic Metabolic Panel:  Lab 04/13/12 8119 04/12/12 0626 04/11/12 1900 04/11/12 1356  NA 134* 133* 131* 132*  K 3.7 4.1 4.3 5.1  CL 99 95* 90* 89*  CO2 19 21 20 21   GLUCOSE 226* 284* 388* 475*  BUN 33* 43* 44* 45*  CREATININE 0.67 0.97 1.18* 1.48*  CALCIUM 9.1 9.6 10.1 10.4  MG -- -- -- --  PHOS -- -- -- --   Liver Function  Tests:  Lab 04/11/12 1900  AST 12  ALT 15  ALKPHOS 96  BILITOT 0.9  PROT 7.4  ALBUMIN 3.4*   No results found for this basename: LIPASE:5,AMYLASE:5 in the last 168 hours No results found for this basename: AMMONIA:5 in the last 168 hours CBC:  Lab 04/13/12 0605 04/12/12 0626 04/11/12 1356  WBC 4.6 6.7 11.0*  NEUTROABS -- -- --  HGB 12.4 12.2 13.4  HCT 36.0 35.6* 39.4  MCV 94.2 93.7 94.7  PLT 231 235 261   Cardiac Enzymes:  Lab 04/11/12 1900  CKTOTAL 29  CKMB 2.9  CKMBINDEX --  TROPONINI <0.30   BNP (last 3 results) No results found for this basename: PROBNP:3 in the last 8760 hours CBG:  Lab 04/13/12 0656 04/12/12 2153 04/12/12 1607 04/12/12 1110 04/12/12 0635  GLUCAP 229* 163* 371* 308* 244*    Recent Results (from the past 240 hour(s))  SURGICAL PCR SCREEN     Status: Abnormal   Collection Time   04/11/12  1:55 PM      Component Value Range Status Comment   MRSA, PCR NEGATIVE  NEGATIVE Final    Staphylococcus aureus POSITIVE (*) NEGATIVE Final     A1C 14.5  Studies: Dg Chest 1 View  04/11/2012  *RADIOLOGY REPORT*  Clinical Data: 56 year old female preoperative study for humerus fracture.  Hypertension.  CHEST - 1 VIEW  Comparison: 04/09/2012 and earlier.  Findings: Comminuted fracture of the proximal left humerus partially visible.  Stable lung volumes with chronic volume loss in the right hemithorax, pleural scarring, and mild elevation of the right hemidiaphragm.  Chronic post traumatic or postoperative changes to the right lateral ribs.  Mediastinal contours are stable and within normal limits.  No pneumothorax, pulmonary edema or definite effusion.  IMPRESSION: 1.  Chronic changes to the right hemithorax. No acute cardiopulmonary abnormality. 2.  Partially visible comminuted proximal left humerus fracture.  Original Report Authenticated By: Harley Hallmark, M.D.   Ct Shoulder Left Wo Contrast  04/09/2012  *RADIOLOGY REPORT*  Clinical Data: Fall 04/05/2012 with  shoulder fracture.  CT OF THE LEFT SHOULDER WITHOUT CONTRAST  Technique:  Multidetector CT imaging was performed according to the standard protocol. Multiplanar CT image reconstructions were also generated.  Comparison: Plain films of the left humerus 04/06/2012.  Findings: As seen on patient's plain films, there is an impacted surgical neck fracture of left humerus with fragment override of approximately 2.5 cm.  The fracture is comminuted with the lesser tuberosity a separate fragment.  The fracture also extends into the anterior aspect of the greater tuberosity.  The humeral head is located.  The acromioclavicular joint is intact.  No other fracture is identified.  Soft tissue swelling and hematoma about the fracture is noted.  Imaged lung parenchyma is unremarkable.  IMPRESSION: Impacted surgical neck fracture of the left humerus involves both the greater and lesser tuberosities.  Original Report Authenticated By: Bernadene Bell. D'ALESSIO, M.D.    Scheduled Meds:    . bd getting started take home kit  1 kit Other Once  . enoxaparin (LOVENOX) injection  40 mg Subcutaneous QHS  . escitalopram  20 mg Oral Daily  . insulin aspart  0-15 Units Subcutaneous TID WC  . insulin aspart  0-5 Units Subcutaneous QHS  . insulin aspart  4 Units Subcutaneous TID WC  . insulin glargine  10 Units Subcutaneous Daily  . living well with diabetes book   Does not apply Once  . mupirocin ointment   Nasal BID  . pneumococcal 23 valent vaccine  0.5 mL Intramuscular Tomorrow-1000  . vitamin B-12  1,000 mcg Oral Daily  . vitamin C  500 mg Oral Daily  . DISCONTD: bd getting started take home kit  1 kit Other Once  . DISCONTD: diltiazem  180 mg Oral Daily   Continuous Infusions:    . sodium chloride 1,000 mL (04/13/12 0817)      Nyomie Ehrlich  [facility] Pager [phone]. If 8PM-8AM, please contact night-coverage at [email or web address], password [facility] 04/13/2012, 10:39 AM  LOS: 2 days

## 2012-04-13 NOTE — Interval H&P Note (Signed)
History and Physical Interval Note:  04/13/2012 12:20 PM  [name]  has presented today for surgery, with the diagnosis of LEFT PROXIMAL HUMERUS FRACTURE  The various methods of treatment have been discussed with the patient and family. After consideration of risks, benefits and other options for treatment, the patient has consented to  Procedure(s) (LRB): OPEN REDUCTION INTERNAL FIXATION (ORIF) PROXIMAL HUMERUS FRACTURE (Left) as a surgical intervention .  The patient's history has been reviewed, patient examined, no change in status, stable for surgery.  I have reviewed the patient's chart and labs.  Questions were answered to the patient's satisfaction.     Sohum Delillo,STEVEN R

## 2012-04-13 NOTE — Transfer of Care (Signed)
Immediate Anesthesia Transfer of Care Note  Patient: [name]  Procedure(s) Performed: Procedure(s) (LRB): OPEN REDUCTION INTERNAL FIXATION (ORIF) PROXIMAL HUMERUS FRACTURE (Left)  Patient Location: PACU  Anesthesia Type: General  Level of Consciousness: awake  Airway & Oxygen Therapy: Patient Spontanous Breathing and Patient connected to nasal cannula oxygen  Post-op Assessment: Report given to PACU RN and Post -op Vital signs reviewed and stable  Post vital signs: Reviewed and stable  Complications: No apparent anesthesia complications

## 2012-04-13 NOTE — Anesthesia Procedure Notes (Addendum)
Procedure Name: Intubation Date/Time: 04/13/2012 12:34 PM Performed by: Garen Lah Pre-anesthesia Checklist: Patient identified, Timeout performed, Emergency Drugs available, Suction available and Patient being monitored Patient Re-evaluated:Patient Re-evaluated prior to inductionOxygen Delivery Method: Circle system utilized Preoxygenation: Pre-oxygenation with 100% oxygen Intubation Type: IV induction Ventilation: Mask ventilation without difficulty Laryngoscope Size: Miller and 2 Grade View: Grade I Tube type: Oral Tube size: 7.5 mm Number of attempts: 1 Airway Equipment and Method: Stylet Placement Confirmation: ETT inserted through vocal cords under direct vision,  positive ETCO2 and breath sounds checked- equal and bilateral Secured at: 21 cm Tube secured with: Tape Dental Injury: Teeth and Oropharynx as per pre-operative assessment    Anesthesia Regional Block:  Interscalene brachial plexus block  Pre-Anesthetic Checklist: ,, timeout performed, Correct Patient, Correct Site, Correct Laterality, Correct Procedure,, site marked, risks and benefits discussed, Surgical consent,  Pre-op evaluation,  At surgeon's request and post-op pain management  Laterality: Left  Prep: chloraprep       Needles:  Injection technique: Single-shot  Needle Type: Echogenic Stimulator Needle     Needle Length: 5cm 5 cm Needle Gauge: 22 and 22 G    Additional Needles:  Procedures: ultrasound guided and nerve stimulator Interscalene brachial plexus block  Nerve Stimulator or Paresthesia:  Response: bicep contraction, 0.45 mA,   Additional Responses:   Narrative:  Start time: 04/13/2012 12:10 PM End time: 04/13/2012 12:21 PM Injection made incrementally with aspirations every 5 mL.  Performed by: Personally  Anesthesiologist: J. Adonis Huguenin, MD  Additional Notes: Functioning IV was confirmed and monitors applied.  A 50mm 22ga echogenic arrow stimulator was used. Sterile prep and  drape,hand hygiene and sterile gloves were used.Ultrasound guidance: relevant anatomy identified, needle position confirmed, local anesthetic spread visualized around nerve(s)., vascular puncture avoided.  Image printed for medical record.  Negative aspiration and negative test dose prior to incremental administration of local anesthetic. The patient tolerated the procedure well.  Interscalene brachial plexus block

## 2012-04-13 NOTE — H&P (View-Only) (Signed)
Orthopedics Progress Note  Subjective: I am ready for surgery.  Objective:  Filed Vitals:   04/13/12 0624  BP: 112/86  Pulse: 67  Temp: 97.9 F (36.6 C)  Resp: 18    General: Awake and alert  Musculoskeletal: Left shoulder in sling, mod swollen, limited ROM due to pain Neurovascularly intact  Lab Results  Component Value Date   WBC 4.6 04/13/2012   HGB 12.4 04/13/2012   HCT 36.0 04/13/2012   MCV 94.2 04/13/2012   PLT 231 04/13/2012       Component Value Date/Time   NA 133* 04/12/2012 0626   K 4.1 04/12/2012 0626   CL 95* 04/12/2012 0626   CO2 21 04/12/2012 0626   GLUCOSE 284* 04/12/2012 0626   BUN 43* 04/12/2012 0626   CREATININE 0.97 04/12/2012 0626   CALCIUM 9.6 04/12/2012 0626   GFRNONAA 64* 04/12/2012 0626   GFRAA 74* 04/12/2012 0626    No results found for this basename: INR, PROTIME    Assessment/Plan: PLAN: OPEN REDUCTION INTERNAL FIXATION (ORIF) PROXIMAL HUMERUS FRACTURE TODAY Aprreciate excellent medical care and optimization of ARF and DM  Viviann Spare R. Ranell Patrick, MD 04/13/2012 7:15 AM

## 2012-04-13 NOTE — Brief Op Note (Signed)
04/11/2012 - 04/13/2012  2:38 PM  PATIENT:  [name]  56 y.o. female  PRE-OPERATIVE DIAGNOSIS:  LEFT PROXIMAL HUMERUS FRACTURE, DISPLACED POST-OPERATIVE DIAGNOSIS:  LEFT PROXIMAL HUMERUS FRACTURE, DISPLACED  PROCEDURE:  Procedure(s) (LRB): OPEN REDUCTION INTERNAL FIXATION (ORIF) PROXIMAL HUMERUS FRACTURE (Left), Tommi Rumps PUY SNP  SURGEON:  Surgeon(s) and Role:    * Verlee Rossetti, MD - Primary  PHYSICIAN ASSISTANT:   ASSISTANTS: Thea Gist, PA-C   ANESTHESIA:   regional, general  EBL:  Total I/O In: 1750 [I.V.:1500; IV Piggyback:250] Out: 100 [Blood:100]  BLOOD ADMINISTERED:none  DRAINS: none   LOCAL MEDICATIONS USED:  MARCAINE     SPECIMEN:  No Specimen  DISPOSITION OF SPECIMEN:  N/A  COUNTS:  YES  TOURNIQUET:  * No tourniquets in log *  DICTATION: .Other Dictation: Dictation Number [id number]  PLAN OF CARE: Admit to inpatient   PATIENT DISPOSITION:  PACU - hemodynamically stable.   Delay start of Pharmacological VTE agent (>24hrs) due to surgical blood loss or risk of bleeding: not applicable

## 2012-04-13 NOTE — Anesthesia Postprocedure Evaluation (Signed)
Anesthesia Post Note  Patient: [name]  Procedure(s) Performed: Procedure(s) (LRB): OPEN REDUCTION INTERNAL FIXATION (ORIF) PROXIMAL HUMERUS FRACTURE (Left)  Anesthesia type: general  Patient location: PACU  Post pain: Pain level controlled  Post assessment: Patient's Cardiovascular Status Stable  Last Vitals:  Filed Vitals:   04/13/12 1530  BP: 124/62  Pulse: 84  Temp: 37.2 C  Resp: 15    Post vital signs: Reviewed and stable  Level of consciousness: sedated  Complications: No apparent anesthesia complications

## 2012-04-14 DIAGNOSIS — S42309A Unspecified fracture of shaft of humerus, unspecified arm, initial encounter for closed fracture: Secondary | ICD-10-CM

## 2012-04-14 LAB — GLUCOSE, CAPILLARY
Glucose-Capillary: 187 mg/dL — ABNORMAL HIGH (ref 70–99)
Glucose-Capillary: 156 mg/dL — ABNORMAL HIGH (ref 70–99)

## 2012-04-14 MED ORDER — METFORMIN HCL 500 MG PO TABS
500.0000 mg | ORAL_TABLET | Freq: Two times a day (BID) | ORAL | Status: DC
  Administered 2012-04-14 – 2012-04-16 (×3): 500 mg via ORAL
  Filled 2012-04-14 (×7): qty 1

## 2012-04-14 MED ORDER — CLONAZEPAM 0.5 MG PO TABS
0.5000 mg | ORAL_TABLET | Freq: Two times a day (BID) | ORAL | Status: DC
  Administered 2012-04-14 – 2012-04-16 (×5): 0.5 mg via ORAL
  Filled 2012-04-14 (×5): qty 1

## 2012-04-14 NOTE — Op Note (Signed)
NAMESERENITI, WAN               ACCOUNT NO.:  [id number]  MEDICAL RECORD NO.:  [id number]  LOCATION:  [id number]                        FACILITY:  [facility]  PHYSICIAN:  [name], M.D. DATE OF BIRTH:  Jul 12, 1956  DATE OF PROCEDURE:  04/13/2012 DATE OF DISCHARGE:                              OPERATIVE REPORT   PREOPERATIVE DIAGNOSIS:  Left displaced proximal humerus fracture.  POSTOPERATIVE DIAGNOSIS:  Left displaced proximal humerus fracture.  PROCEDURE PERFORMED:  Open reduction and internal fixation of left displaced proximal humerus fracture using DePuy SNP.  ATTENDING SURGEON:  [name], MD  ASSISTANT:  Donnie Coffin. Dixon, PA-C  ANESTHESIA:  General anesthesia was used plus interscalene block.  ESTIMATED BLOOD LOSS:  Less than 100 mL.  FLUID REPLACEMENT:  1500 mL of crystalloid.  INSTRUMENT COUNTS:  Correct.  COMPLICATIONS:  There were no complications.  Perioperative antibiotics were given.  INDICATIONS:  The patient is a 56 year old female status post fall injuring her left shoulder.  The patient presented with a displaced comminuted proximal humerus fracture.  The patient's fracture displaced into severe varus and given the combination of the significant displacement and also the poor position of the fracture fragments, I counseled the patient regarding recommendation for shoulder surgery to realign the fracture and to stabilize with open reduction and internal fixation.  The patient fully understood risks and benefits of both surgical and nonsurgical approaches and wished to proceed with surgery. Informed consent was obtained.  DESCRIPTION OF PROCEDURE:  After adequate level of anesthesia was achieved, the patient was positioned in the modified beach-chair position.  Left shoulder correctly identified, sterilely prepped and draped in usual manner.  Time-out called.  We entered the shoulder using the deltopectoral incision starting at the coracoid  process extending down to the anterior humerus.  Dissection down to the subcutaneous tissues using Bovie.  Cephalic vein identified, taken laterally.  The deltoid and pectoralis taken medially.  Identified the fractured humerus, was able to take a Cobb elevator, and elevate the humeral head back up into position, I could take my finger down inferomedially and also push up on the ball with my index finger, and then we were able to place a DePuy SNP nail plate with the nail down the humeral shaft and with the outside portion of the plate in appropriate position.  I placed a central guide pin, checked on AP and lateral C-arm views and then went ahead and started placing our 5 threaded smooth pegs.  Once those were locked into position on the plate, everything moved together as a unit. We checked position of those smooth pegs, we were happy with the placement of those and then ranged her shoulder fully to make sure we did not have any long pegs.  At that point, we went ahead and fixed our nail inside the canal with 3 unicortical screws.  Once that was engaged into the plate, we could verify that on C-arm.  We ranged the shoulder again to make sure it was stable, moved as a unit, which it did and that there were no long pegs.  We thoroughly irrigated the wound, closed the wound in layers with deltopectoral closure with #1 Vicryl.  We also sewed the rotator cuff into place.  We took sutures posterior to the greater tuberosity, which was fractured and tied those into the plate into the subscapularis anatomically reducing the greater tuberosity and securing that to make sure displaced in rehab and then we closed deltopectoral with #1 Vicryl followed by 2-0 Vicryl for subcu closure and 4-0 Monocryl for skin.  Steri-Strips were applied followed by sterile dressing and shoulder sling.  The patient tolerated the procedure well.     [name], M.D.     SRN/MEDQ  D:  04/13/2012  T:   04/14/2012  Job:  [id number]

## 2012-04-14 NOTE — Progress Notes (Signed)
Occupational Therapy Evaluation Patient Details Name: [name] MRN: [id number] DOB: [birth year] Today's Date: 04/14/2012 Time: 4098-1191 OT Time Calculation (min): 35 min  OT Assessment / Plan / Recommendation Clinical Impression  56 yo s/p L humeral fx @ 7 days ago. underwent ORIF. AAROM within pain tolerance. Sling for comfort. Awaiting formal WBS from Dr. Ranell Patrick. Pt began AAROM today with good performance. Will be able to D/C home Monday. If needed, will follow up with outpt therapy.     OT Assessment  Patient needs continued OT Services    Follow Up Recommendations  Outpatient OT;Other (comment) (pending progress)    Barriers to Discharge None    Equipment Recommendations  None recommended by OT    Recommendations for Other Services  none  Frequency  Min 3X/week    Precautions / Restrictions Precautions Precautions: Other (comment) (AAROM within pain tolerance. No resistance) Type of Shoulder Precautions: NWB at this time until further specified Restrictions Weight Bearing Restrictions: Yes Other Position/Activity Restrictions: NWB   Pertinent Vitals/Pain 6. Repositioned. Cold applied    ADL  Eating/Feeding: Performed;Set up Where Assessed - Eating/Feeding: Bed level Grooming: Simulated;Minimal assistance Where Assessed - Grooming: Supine, head of bed up Upper Body Bathing: Simulated;Moderate assistance Where Assessed - Upper Body Bathing: Supine, head of bed up Lower Body Bathing: Simulated;Minimal assistance Where Assessed - Lower Body Bathing: Unsupported sitting;Supported standing Upper Body Dressing: Simulated;Moderate assistance Where Assessed - Upper Body Dressing: Unsupported sitting Lower Body Dressing: Simulated;Minimal assistance Where Assessed - Lower Body Dressing: Unsupported sit to stand Toilet Transfer: Performed;Modified independent Toilet Transfer Method: Sit to Barista: Comfort height toilet Toileting - Clothing  Manipulation and Hygiene: Simulated;Modified independent Where Assessed - Toileting Clothing Manipulation and Hygiene: Standing Transfers/Ambulation Related to ADLs: supervision ADL Comments: Will benefit from ADL retraining. sling for comfort.    OT Diagnosis: Generalized weakness;Acute pain  OT Problem List: Decreased strength;Decreased range of motion;Decreased coordination;Decreased knowledge of precautions;Impaired UE functional use;Pain;Increased edema OT Treatment Interventions: Self-care/ADL training;Therapeutic exercise;Energy conservation;Splinting;Patient/family education   OT Goals Acute Rehab OT Goals OT Goal Formulation: With patient Time For Goal Achievement: 04/21/12 Potential to Achieve Goals: Good ADL Goals Pt Will Perform Grooming: with supervision;with caregiver independent in assisting;Unsupported ADL Goal: Grooming - Progress: Goal set today Pt Will Perform Upper Body Bathing: with supervision;with caregiver independent in assisting;Unsupported ADL Goal: Upper Body Bathing - Progress: Goal set today Pt Will Perform Upper Body Dressing: with supervision;with caregiver independent in assisting;Unsupported ADL Goal: Upper Body Dressing - Progress: Goal set today Additional ADL Goal #1: pt will be independent with donning/doffing sling and sling use. ADL Goal: Additional Goal #1 - Progress: Goal set today Arm Goals Pt Will Perform AROM: Independently;Left upper extremity;2 sets;to maintain range of motion;Other (comment) (AAROM LUE all planes) Arm Goal: AROM - Progress: Goal set today Additional Arm Goal #1: Pt will be independent in edema control techniques for LUE. Arm Goal: Additional Goal #1 - Progress: Goal set today  Visit Information  Last OT Received On: 04/14/12 Assistance Needed: +1    Subjective Data   the ice makes it feel better   Prior Functioning  Vision/Perception  Home Living Lives With: Spouse Available Help at Discharge: Available  PRN/intermittently;Friend(s);Neighbor Type of Home: House Home Access: Stairs to enter Secretary/administrator of Steps: 11 Home Layout: One level Bathroom Shower/Tub: Engineer, manufacturing systems: Handicapped height Home Adaptive Equipment: Bedside commode/3-in-1;Shower chair with back Prior Function Level of Independence: Independent Able to Take Stairs?: Yes Driving: Yes 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 Communication Communication: No difficulties Dominant Hand: Right      Cognition  Overall Cognitive Status: Appears within functional limits for tasks assessed/performed Arousal/Alertness: Awake/alert Orientation Level: Appears intact for tasks assessed Behavior During Session: [facility] for tasks performed    Extremity/Trunk Assessment Right Upper Extremity Assessment RUE ROM/Strength/Tone: Within functional levels Left Upper Extremity Assessment LUE ROM/Strength/Tone: Deficits;Due to pain LUE ROM/Strength/Tone Deficits: due to fracture LUE Sensation: WFL - Light Touch;WFL - Proprioception (only remnant from block) LUE Coordination: WFL - fine motor;Deficits LUE Coordination Deficits: due to sling/immobility Right Lower Extremity Assessment RLE ROM/Strength/Tone: Within functional levels Left Lower Extremity Assessment LLE ROM/Strength/Tone: Within functional levels Trunk Assessment Trunk Assessment: Normal   Mobility Bed Mobility Bed Mobility: Supine to Sit Supine to Sit: 7: Independent Transfers Transfers: Sit to Stand;Stand to Sit Sit to Stand: 6: Modified independent (Device/Increase time) Stand to Sit: 6: Modified independent (Device/Increase time)   Exercise  AAROM L shoulder within pain tolerance.  Balance Balance Balance Assessed:  (normal)  End of Session OT - End of Session Activity Tolerance: Patient limited by pain Patient left: in bed;with call bell/phone within reach Nurse Communication: Mobility status  GO      Normand Damron,HILLARY 04/14/2012, 4:59 PM [facility], OTR/L  [phone] 04/14/2012

## 2012-04-14 NOTE — Progress Notes (Signed)
Orthopedics Progress Note  Subjective: I had a rough night.  Objective:  Filed Vitals:   04/14/12 0544  BP: 100/55  Pulse: 76  Temp: 98.1 F (36.7 C)  Resp: 18    General: Awake and alert  Musculoskeletal: Left shoulder dressing CDI Neurovascularly intact  Lab Results  Component Value Date   WBC 4.6 04/13/2012   HGB 12.4 04/13/2012   HCT 36.0 04/13/2012   MCV 94.2 04/13/2012   PLT 231 04/13/2012       Component Value Date/Time   NA 134* 04/13/2012 0605   K 3.7 04/13/2012 0605   CL 99 04/13/2012 0605   CO2 19 04/13/2012 0605   GLUCOSE 226* 04/13/2012 0605   BUN 33* 04/13/2012 0605   CREATININE 0.67 04/13/2012 0605   CALCIUM 9.1 04/13/2012 0605   GFRNONAA >90 04/13/2012 0605   GFRAA >90 04/13/2012 0605    No results found for this basename: INR, PROTIME    Assessment/Plan: POD #1 s/p Procedure(s): OPEN REDUCTION INTERNAL FIXATION (ORIF) PROXIMAL HUMERUS FRACTURE Doing well.  Gentle AAROM as tolerated. No resistance. Likely D/C on Monday. Pain control. Po and IV.  [name], MD 04/14/2012 7:48 AM

## 2012-04-14 NOTE — Progress Notes (Signed)
[facility] PROGRESS NOTE  [name] QIO:[id number] DOB: November 01, 1955 DOA: 04/11/2012 PCP: Lenora Boys, MD  Assessment/Plan: Principal Problem:  *ARF (acute renal failure) Active Problems:  Dehydration  DM type 2 causing complication  HTN (hypertension)  Alcohol abuse  Pancreatitis, acute  Hx of cancer of lung  Hyponatremia  Fracture of left humerus  1. ARF - due to poor by mouth intake, outpatient use of hydrochlorothiazide and angiotensin receptor  blocker . Started on iv fluids from the day of admission , holding arb, -creatinine improved 2. New onset DM2 -patient has a hemoglobin A1c of 14. We shall treat with insulin for now. In the long run the patient should start a program of diet exercise and titration up of oral meds. Initiated po metformin 04/14/12 3. Alcohol abuse-counseled 4. Shoulder fracture - underwent ORIF on 04/13/12  - optimize pain control  Code Status: Full Family Communication: Daughter at bedside Disposition Plan: Home     HPI/Subjective: Had a difficult night due to issues with IV access  Objective: Filed Vitals:   04/13/12 1530 04/13/12 1554 04/13/12 2107 04/14/12 0544  BP: 124/62 114/49 100/59 100/55  Pulse: 84 84 90 76  Temp: 99 F (37.2 C) 98.6 F (37 C) 97.9 F (36.6 C) 98.1 F (36.7 C)  TempSrc:      Resp: 15 16 18 18   Height:      Weight:      SpO2: 99% 100% 95% 92%    Intake/Output Summary (Last 24 hours) at 04/14/12 0829 Last data filed at 04/14/12 0700  Gross per 24 hour  Intake   2230 ml  Output    100 ml  Net   2130 ml    Exam:   Alert and oriented x3  Chest clear to auscultation  Abdomen soft nontender  Heart regular rate and rhythm  Data Reviewed: Basic Metabolic Panel:  Lab 04/13/12 3244 04/12/12 0626 04/11/12 1900 04/11/12 1356  NA 134* 133* 131* 132*  K 3.7 4.1 4.3 5.1  CL 99 95* 90* 89*  CO2 19 21 20 21   GLUCOSE 226* 284* 388* 475*  BUN 33* 43* 44* 45*  CREATININE 0.67 0.97 1.18* 1.48*   CALCIUM 9.1 9.6 10.1 10.4  MG -- -- -- --  PHOS -- -- -- --   Liver Function Tests:  Lab 04/11/12 1900  AST 12  ALT 15  ALKPHOS 96  BILITOT 0.9  PROT 7.4  ALBUMIN 3.4*   No results found for this basename: LIPASE:5,AMYLASE:5 in the last 168 hours No results found for this basename: AMMONIA:5 in the last 168 hours CBC:  Lab 04/13/12 0605 04/12/12 0626 04/11/12 1356  WBC 4.6 6.7 11.0*  NEUTROABS -- -- --  HGB 12.4 12.2 13.4  HCT 36.0 35.6* 39.4  MCV 94.2 93.7 94.7  PLT 231 235 261   Cardiac Enzymes:  Lab 04/11/12 1900  CKTOTAL 29  CKMB 2.9  CKMBINDEX --  TROPONINI <0.30   BNP (last 3 results) No results found for this basename: PROBNP:3 in the last 8760 hours CBG:  Lab 04/14/12 0726 04/13/12 2104 04/13/12 1656 04/13/12 1439 04/13/12 1136  GLUCAP 170* 293* 253* 174* 169*    Recent Results (from the past 240 hour(s))  SURGICAL PCR SCREEN     Status: Abnormal   Collection Time   04/11/12  1:55 PM      Component Value Range Status Comment   MRSA, PCR NEGATIVE  NEGATIVE Final    Staphylococcus aureus POSITIVE (*) NEGATIVE  Final     A1C 14.5  Studies: Dg Chest 1 View  04/11/2012  *RADIOLOGY REPORT*  Clinical Data: 56 year old female preoperative study for humerus fracture.  Hypertension.  CHEST - 1 VIEW  Comparison: 04/09/2012 and earlier.  Findings: Comminuted fracture of the proximal left humerus partially visible.  Stable lung volumes with chronic volume loss in the right hemithorax, pleural scarring, and mild elevation of the right hemidiaphragm.  Chronic post traumatic or postoperative changes to the right lateral ribs.  Mediastinal contours are stable and within normal limits.  No pneumothorax, pulmonary edema or definite effusion.  IMPRESSION: 1.  Chronic changes to the right hemithorax. No acute cardiopulmonary abnormality. 2.  Partially visible comminuted proximal left humerus fracture.  Original Report Authenticated By: Harley Hallmark, M.D.   Ct Shoulder  Left Wo Contrast  04/09/2012  *RADIOLOGY REPORT*  Clinical Data: Fall 04/05/2012 with shoulder fracture.  CT OF THE LEFT SHOULDER WITHOUT CONTRAST  Technique:  Multidetector CT imaging was performed according to the standard protocol. Multiplanar CT image reconstructions were also generated.  Comparison: Plain films of the left humerus 04/06/2012.  Findings: As seen on patient's plain films, there is an impacted surgical neck fracture of left humerus with fragment override of approximately 2.5 cm.  The fracture is comminuted with the lesser tuberosity a separate fragment.  The fracture also extends into the anterior aspect of the greater tuberosity.  The humeral head is located.  The acromioclavicular joint is intact.  No other fracture is identified.  Soft tissue swelling and hematoma about the fracture is noted.  Imaged lung parenchyma is unremarkable.  IMPRESSION: Impacted surgical neck fracture of the left humerus involves both the greater and lesser tuberosities.  Original Report Authenticated By: Bernadene Bell. D'ALESSIO, M.D.    Scheduled Meds:    . bd getting started take home kit  1 kit Other Once  .  ceFAZolin (ANCEF) IV  2 g Intravenous Q6H  . enoxaparin (LOVENOX) injection  40 mg Subcutaneous QHS  . escitalopram  20 mg Oral Daily  . insulin aspart  0-15 Units Subcutaneous TID WC  . insulin aspart  0-5 Units Subcutaneous QHS  . insulin aspart  4 Units Subcutaneous TID WC  . insulin glargine  10 Units Subcutaneous Daily  . mupirocin ointment   Nasal BID  . pneumococcal 23 valent vaccine  0.5 mL Intramuscular Tomorrow-1000  . vitamin B-12  1,000 mcg Oral Daily  . vitamin C  500 mg Oral Daily   Continuous Infusions:    . DISCONTD: sodium chloride 1,000 mL (04/13/12 0817)  . DISCONTD: sodium chloride    . DISCONTD: lactated ringers 50 mL/hr at 04/13/12 1156      [name]  [facility] Pager [phone]. If 8PM-8AM, please contact night-coverage at [email or web address], password  [facility] 04/14/2012, 8:29 AM  LOS: 3 days

## 2012-04-15 LAB — GLUCOSE, CAPILLARY

## 2012-04-15 MED ORDER — OXYCODONE HCL 5 MG PO TABS
5.0000 mg | ORAL_TABLET | ORAL | Status: DC | PRN
  Administered 2012-04-15 – 2012-04-16 (×6): 10 mg via ORAL
  Filled 2012-04-15 (×4): qty 2
  Filled 2012-04-15: qty 1
  Filled 2012-04-15 (×2): qty 2

## 2012-04-15 MED ORDER — INSULIN GLARGINE 100 UNIT/ML ~~LOC~~ SOLN
15.0000 [IU] | Freq: Every day | SUBCUTANEOUS | Status: DC
  Administered 2012-04-15 – 2012-04-16 (×2): 15 [IU] via SUBCUTANEOUS

## 2012-04-15 NOTE — Progress Notes (Signed)
[facility] PROGRESS NOTE  [name] ZOX:[id number] DOB: 1956/06/17 DOA: 04/11/2012 PCP: Maria Boys, MD This is a late entry for date of service April 15, 2012 Assessment/Plan: Principal Problem:  *ARF (acute renal failure) Active Problems:  Dehydration  DM type 2 causing complication  HTN (hypertension)  Alcohol abuse  Pancreatitis, acute  Hx of cancer of lung  Hyponatremia  Fracture of left humerus  1. ARF - due to poor by mouth intake, outpatient use of hydrochlorothiazide and angiotensin receptor  blocker . Started on iv fluids from the day of admission , holding arb, -creatinine improved 2. New onset DM2 -patient has a hemoglobin A1c of 14. We shall treat with insulin for now. In the long run the patient should start a program of diet exercise and titration up of oral meds. Initiated po metformin 04/14/12. Patient to be educated about outpatient Lantus administration 3. Alcohol abuse-counseled 4. Shoulder fracture - underwent ORIF on 04/13/12  - optimize pain control  Code Status: Full Family Communication: Daughter at bedside Disposition Plan: Home     HPI/Subjective: Still having significant amount of pain in the left shoulder  Objective: Filed Vitals:   04/14/12 0544 04/14/12 1508 04/14/12 1945 04/15/12 0534  BP: 100/55 110/64 132/61 122/59  Pulse: 76 96 78 74  Temp: 98.1 F (36.7 C) 98.7 F (37.1 C) 98.4 F (36.9 C) 97.9 F (36.6 C)  TempSrc:  Oral Oral Oral  Resp: 18 18 18 16   Height:      Weight:      SpO2: 92% 96% 90% 100%    Intake/Output Summary (Last 24 hours) at 04/15/12 8119 Last data filed at 04/14/12 1849  Gross per 24 hour  Intake   1440 ml  Output      0 ml  Net   1440 ml    Exam:   Alert oriented x3  Chest clear to auscultation  Heart regular without murmurs rubs or gallops  Data Reviewed: Basic Metabolic Panel:  Lab 04/13/12 1478 04/12/12 0626 04/11/12 1900 04/11/12 1356  NA 134* 133* 131* 132*  K 3.7 4.1 4.3  5.1  CL 99 95* 90* 89*  CO2 19 21 20 21   GLUCOSE 226* 284* 388* 475*  BUN 33* 43* 44* 45*  CREATININE 0.67 0.97 1.18* 1.48*  CALCIUM 9.1 9.6 10.1 10.4  MG -- -- -- --  PHOS -- -- -- --   Liver Function Tests:  Lab 04/11/12 1900  AST 12  ALT 15  ALKPHOS 96  BILITOT 0.9  PROT 7.4  ALBUMIN 3.4*   No results found for this basename: LIPASE:5,AMYLASE:5 in the last 168 hours No results found for this basename: AMMONIA:5 in the last 168 hours CBC:  Lab 04/13/12 0605 04/12/12 0626 04/11/12 1356  WBC 4.6 6.7 11.0*  NEUTROABS -- -- --  HGB 12.4 12.2 13.4  HCT 36.0 35.6* 39.4  MCV 94.2 93.7 94.7  PLT 231 235 261   Cardiac Enzymes:  Lab 04/11/12 1900  CKTOTAL 29  CKMB 2.9  CKMBINDEX --  TROPONINI <0.30   BNP (last 3 results) No results found for this basename: PROBNP:3 in the last 8760 hours CBG:  Lab 04/15/12 0705 04/14/12 2132 04/14/12 1647 04/14/12 1116 04/14/12 0726  GLUCAP 201* 156* 222* 187* 170*    Recent Results (from the past 240 hour(s))  SURGICAL PCR SCREEN     Status: Abnormal   Collection Time   04/11/12  1:55 PM      Component Value Range  Status Comment   MRSA, PCR NEGATIVE  NEGATIVE Final    Staphylococcus aureus POSITIVE (*) NEGATIVE Final     A1C 14.5  Studies: Dg Chest 1 View  04/11/2012  *RADIOLOGY REPORT*  Clinical Data: 56 year old female preoperative study for humerus fracture.  Hypertension.  CHEST - 1 VIEW  Comparison: 04/09/2012 and earlier.  Findings: Comminuted fracture of the proximal left humerus partially visible.  Stable lung volumes with chronic volume loss in the right hemithorax, pleural scarring, and mild elevation of the right hemidiaphragm.  Chronic post traumatic or postoperative changes to the right lateral ribs.  Mediastinal contours are stable and within normal limits.  No pneumothorax, pulmonary edema or definite effusion.  IMPRESSION: 1.  Chronic changes to the right hemithorax. No acute cardiopulmonary abnormality. 2.   Partially visible comminuted proximal left humerus fracture.  Original Report Authenticated By: [name], M.D.   Ct Shoulder Left Wo Contrast  04/09/2012  *RADIOLOGY REPORT*  Clinical Data: Fall 04/05/2012 with shoulder fracture.  CT OF THE LEFT SHOULDER WITHOUT CONTRAST  Technique:  Multidetector CT imaging was performed according to the standard protocol. Multiplanar CT image reconstructions were also generated.  Comparison: Plain films of the left humerus 04/06/2012.  Findings: As seen on patient's plain films, there is an impacted surgical neck fracture of left humerus with fragment override of approximately 2.5 cm.  The fracture is comminuted with the lesser tuberosity a separate fragment.  The fracture also extends into the anterior aspect of the greater tuberosity.  The humeral head is located.  The acromioclavicular joint is intact.  No other fracture is identified.  Soft tissue swelling and hematoma about the fracture is noted.  Imaged lung parenchyma is unremarkable.  IMPRESSION: Impacted surgical neck fracture of the left humerus involves both the greater and lesser tuberosities.  Original Report Authenticated By: [name]. [name], M.D.    Scheduled Meds:    . bd getting started take home kit  1 kit Other Once  . clonazePAM  0.5 mg Oral BID  . enoxaparin (LOVENOX) injection  40 mg Subcutaneous QHS  . escitalopram  20 mg Oral Daily  . insulin aspart  0-15 Units Subcutaneous TID WC  . insulin aspart  0-5 Units Subcutaneous QHS  . insulin aspart  4 Units Subcutaneous TID WC  . insulin glargine  10 Units Subcutaneous Daily  . metFORMIN  500 mg Oral BID WC  . mupirocin ointment   Nasal BID  . vitamin B-12  1,000 mcg Oral Daily  . vitamin C  500 mg Oral Daily   Continuous Infusions:      [name]  [facility] Pager [phone]. If 8PM-8AM, please contact night-coverage at [email or web address], password [facility] 04/15/2012, 8:12 AM  LOS: 4 days

## 2012-04-15 NOTE — Progress Notes (Signed)
Subjective: Left upper arm pretty sore.  Objective: Vital signs in last 24 hours: Temp:  [97.9 F (36.6 C)-98.7 F (37.1 C)] 97.9 F (36.6 C) (07/21 0534) Pulse Rate:  [74-96] 74  (07/21 0534) Resp:  [16-18] 16  (07/21 0534) BP: (110-132)/(59-64) 122/59 mmHg (07/21 0534) SpO2:  [90 %-100 %] 100 % (07/21 0534)  Intake/Output from previous day: 07/20 0701 - 07/21 0700 In: 1440 [P.O.:1440] Out: -  Intake/Output this shift:     Basename 04/13/12 0605  HGB 12.4    Basename 04/13/12 0605  WBC 4.6  RBC 3.82*  HCT 36.0  PLT 231    Basename 04/13/12 0605  NA 134*  K 3.7  CL 99  CO2 19  BUN 33*  CREATININE 0.67  GLUCOSE 226*  CALCIUM 9.1   No results found for this basename: LABPT:2,INR:2 in the last 72 hours  Pt cons alert self bathing at bed side, left shoulder dressing clean, arm NMVI  Assessment/Plan: PO left prox humerus FX ORIF doing fair, will adjust pain meds   Reighn Kaplan W 04/15/2012, 10:21 AM

## 2012-04-16 ENCOUNTER — Encounter (HOSPITAL_COMMUNITY): Payer: Self-pay | Admitting: Orthopedic Surgery

## 2012-04-16 LAB — GLUCOSE, CAPILLARY
Glucose-Capillary: 160 mg/dL — ABNORMAL HIGH (ref 70–99)
Glucose-Capillary: 200 mg/dL — ABNORMAL HIGH (ref 70–99)
Glucose-Capillary: 181 mg/dL — ABNORMAL HIGH (ref 70–99)
Glucose-Capillary: 176 mg/dL — ABNORMAL HIGH (ref 70–99)

## 2012-04-16 MED ORDER — FOLIC ACID 1 MG PO TABS: 1.0000 mg | ORAL_TABLET | Freq: Every day | ORAL | Status: AC

## 2012-04-16 MED ORDER — INSULIN PEN STARTER KIT
1.0000 | Freq: Once | Status: DC
  Filled 2012-04-16 (×3): qty 1

## 2012-04-16 MED ORDER — OXYCODONE-ACETAMINOPHEN 5-325 MG PO TABS
1.0000 | ORAL_TABLET | ORAL | Status: DC | PRN
  Administered 2012-04-16: 1 via ORAL
  Filled 2012-04-16: qty 1

## 2012-04-16 MED ORDER — THIAMINE HCL 100 MG PO TABS: 100.0000 mg | ORAL_TABLET | Freq: Every day | ORAL | Status: AC

## 2012-04-16 MED ORDER — CLONAZEPAM 0.5 MG PO TABS: 0.5000 mg | ORAL_TABLET | Freq: Two times a day (BID) | ORAL | Status: DC

## 2012-04-16 MED ORDER — METHOCARBAMOL 500 MG PO TABS: 500.0000 mg | ORAL_TABLET | Freq: Four times a day (QID) | ORAL | Status: AC | PRN

## 2012-04-16 MED ORDER — INSULIN PEN NEEDLE 29G X 12MM MISC: Status: AC

## 2012-04-16 MED ORDER — INSULIN DETEMIR 100 UNIT/ML ~~LOC~~ SOLN: 15.0000 [IU] | Freq: Every day | SUBCUTANEOUS | Status: DC

## 2012-04-16 MED ORDER — OXYCODONE-ACETAMINOPHEN 5-325 MG PO TABS: 1.0000 | ORAL_TABLET | ORAL | Status: DC | PRN

## 2012-04-16 MED ORDER — METFORMIN HCL 500 MG PO TABS: 500.0000 mg | ORAL_TABLET | Freq: Two times a day (BID) | ORAL | Status: DC

## 2012-04-16 NOTE — Care Management Note (Signed)
    Page 1 of 1   04/16/2012     12:26:50 PM   CARE MANAGEMENT NOTE 04/16/2012  Patient:  [name]   Account Number:  [id number]  Date Initiated:  04/16/2012  Documentation initiated by:  Anette Guarneri  Subjective/Objective Assessment:   POD#5 s/p right shoulder ORIF  needs OP PT/OT     Action/Plan:   home with self care   Anticipated DC Date:  04/16/2012   Anticipated DC Plan:  HOME/SELF CARE      DC Planning Services  CM consult      Choice offered to / List presented to:             Status of service:  Completed, signed off Medicare Important Message given?  NO (If response is "NO", the following Medicare IM given date fields will be blank) Date Medicare IM given:   Date Additional Medicare IM given:    Discharge Disposition:  HOME/SELF CARE  Per UR Regulation:  Reviewed for med. necessity/level of care/duration of stay  If discussed at Long Length of Stay Meetings, dates discussed:    Comments:  04/16/12  12:24 Anette Guarneri RN/CM per Dr. Lavera Guise patient needs OP PT/OT Patient would like to go to [facility] physical therapy, gave patient copy of MD order, copy of Op report and directions to [facility] PT.

## 2012-04-16 NOTE — Progress Notes (Addendum)
Showed patient and husband how to properly use insulin pen for home.  Patient able to provide successful return demonstration.  Husband supportive and also actively listened and learned.  Reviewed proper disposal of used pen needles.  Reminded patient and husband to always perform 2 unit prime/air shot before every injection.  Provided trouble shooting information.  Patient also given Flexpen insulin starter kit for home.  Patient to follow up with her primary MD Dr. Foy Guadalajara in the next week.  Will follow.  Ambrose Finland RN, MSN, CDE Diabetes Coordinator Inpatient Diabetes [facility] [phone]

## 2012-04-16 NOTE — Progress Notes (Signed)
Orthopedics Progress Note  Subjective: Pt c/o mild pain to left shoulder today but overall doing well  Objective:  Filed Vitals:   04/16/12 0641  BP: 129/44  Pulse: 81  Temp: 98.4 F (36.9 C)  Resp: 16    General: Awake and alert  Musculoskeletal: left shoulder incision healing well, nv intact distally, minimal edema Neurovascularly intact  Lab Results  Component Value Date   WBC 4.6 04/13/2012   HGB 12.4 04/13/2012   HCT 36.0 04/13/2012   MCV 94.2 04/13/2012   PLT 231 04/13/2012       Component Value Date/Time   NA 134* 04/13/2012 0605   K 3.7 04/13/2012 0605   CL 99 04/13/2012 0605   CO2 19 04/13/2012 0605   GLUCOSE 226* 04/13/2012 0605   BUN 33* 04/13/2012 0605   CREATININE 0.67 04/13/2012 0605   CALCIUM 9.1 04/13/2012 0605   GFRNONAA >90 04/13/2012 0605   GFRAA >90 04/13/2012 0605    No results found for this basename: INR, PROTIME    Assessment/Plan: POD #2 s/p Procedure(s):left  OPEN REDUCTION INTERNAL FIXATION (ORIF) PROXIMAL HUMERUS FRACTURE  Stable for discharge from orthopedic standpoint once cleared from medical team F/u in 2 weeks  Continue gentle rom but no lifting pushing or pulling  Viviann Spare R. Ranell Patrick, MD 04/16/2012 10:51 AM

## 2012-04-16 NOTE — Progress Notes (Signed)
Occupational Therapy Treatment Patient Details Name: [name] MRN: [id number] DOB: 1956-09-07 Today's Date: 04/16/2012 Time: 4098-1191 OT Time Calculation (min): 26 min  OT Assessment / Plan / Recommendation Comments on Treatment Session This 56 yo female making progress, will need continued therapy for her arm post D/C    Follow Up Recommendations  Outpatient OT (per MD)    Barriers to Discharge       Equipment Recommendations  None recommended by OT    Recommendations for Other Services    Frequency Min 3X/week   Plan Discharge plan remains appropriate    Precautions / Restrictions Precautions Precautions: Shoulder Type of Shoulder Precautions: AAROM to tolerance; no lifting, pushing, pulling Precaution Booklet Issued: Yes (comment) Precaution Comments: post op shoulder sheet with modifications Restrictions Weight Bearing Restrictions: Yes LUE Weight Bearing: Non weight bearing   Pertinent Vitals/Pain RN made aware that pt needed pain medication    ADL  Upper Body Dressing: Performed;Moderate assistance Where Assessed - Upper Body Dressing: Unsupported sitting Lower Body Dressing: Performed;Set up (slip on shoes) Where Assessed - Lower Body Dressing: Unsupported sitting ADL Comments: Sling don with Mod A. Post op shoulder handout given and also went over it with pt and husband    OT Diagnosis:    OT Problem List:   OT Treatment Interventions:     OT Goals ADL Goals ADL Goal: Upper Body Dressing - Progress: Progressing toward goals ADL Goal: Additional Goal #1 - Progress: Progressing toward goals Arm Goals Arm Goal: AROM - Progress: Progressing toward goal  Visit Information  Last OT Received On: 04/16/12 Assistance Needed: +1    Subjective Data      Prior Functioning       Cognition  Overall Cognitive Status: Appears within functional limits for tasks assessed/performed Arousal/Alertness: Awake/alert Orientation Level: Appears intact for tasks  assessed Behavior During Session: [facility] for tasks performed    Mobility Transfers Transfers: Sit to Stand;Stand to Sit Sit to Stand: 7: Independent;Without upper extremity assist;From bed Stand to Sit: 7: Independent;Without upper extremity assist;To bed   Exercises Other Exercises Other Exercises: Had pt to 5 repetitions of elbow flexion/extension and encouraged her to move her arm to her tolerance as well as use/move her hand and wrist Other Exercises: Went over [employer] exercise with pt for external/internal rotation, abduction/adduction, and foreward flexion  Balance    End of Session OT - End of Session Activity Tolerance: Patient limited by pain Patient left: with family/visitor present (sitting EOB)       [name] [phone] 04/16/2012, 2:45 PM

## 2012-04-16 NOTE — Discharge Summary (Signed)
Physician Discharge Summary  [name] ZDG:[id number] DOB: [birth year] DOA: 04/11/2012  PCP: Lenora Boys, MD  Admit date: 04/11/2012 Discharge date: 04/16/2012  Recommendations for Outpatient Follow-up:  1. Follow up on new onset diabetes 2. Follow up on anxiety issues - started on klonopin bid  Discharge Diagnoses:  ARF (acute renal failure) - due to diuretic, decrease po intake - resolved   Dehydration - resolved   DM type 2 causing complication  HTN (hypertension)  Alcohol abuse  Pancreatitis, acute  Hx of cancer of lung  Hyponatremia  Fracture of left humerus s/p orif    Discharge Condition: good  Diet recommendation: carb modified   History of present illness:  56 year old woman with history of hypertension presented today to the operating room for a left shoulder repair. The patient fell 7 days prior to admission. She was scheduled today for left shoulder repair for her humeral fracture. In the postoperative area she was found to be with acute renal insufficiency, hyperglycemia, hyponatremia all of which are new findings for the patient. Ms. [name] reports that since her fracture she's been feeling poorly and been unable to eat that well. She's been in a great deal of pain. She says she stopped drinking one week ago      Hospital Course:  1. ARF - due to poor by mouth intake, outpatient use of hydrochlorothiazide and angiotensin receptor blocker . Started on iv fluids from the day of admission, holding arb, -creatinine improved with iv fluids and back to normal 2. New onset diabetes - A1C 14 - we treated with insulin in the hospital - and we controlled the hyperglycemia and symptoms. We initiated also Metformin BID and will place the patient on Levemir daily. She was refered for outpatient education  3. Left shoulder fracture s/p orif -04/14/12 - patient did well postop 4. Alcohol abuse - c/w vitamins, counseled extensively.     Procedures:  ORIF  Consultations:  [facility]  Discharge Exam: Filed Vitals:   04/16/12 0641  BP: 129/44  Pulse: 81  Temp: 98.4 F (36.9 C)  Resp: 16   Filed Vitals:   04/14/12 1945 04/15/12 0534 04/15/12 2100 04/16/12 0641  BP: 132/61 122/59 125/77 129/44  Pulse: 78 74 92 81  Temp: 98.4 F (36.9 C) 97.9 F (36.6 C) 98.8 F (37.1 C) 98.4 F (36.9 C)  TempSrc: Oral Oral Oral Oral  Resp: 18 16 16 16   Height:      Weight:      SpO2: 90% 100% 97% 85%   General: axox 3 Cardiovascular: RRR Respiratory: CTAB  Discharge Instructions  Discharge Orders    Future Appointments: Provider: Department: Dept Phone: Center:   05/08/2012 4:15 PM Young Berry May, RN, RD, CDE [facility] [phone] N[facility]     Future Orders Please Complete By Expires   Ambulatory referral to Nutrition and Diabetic Education      Comments:   Patient with new onset diabetes this admission- A1c 14.5%- Patient having orthopedic surgery of arm 04/13/12 (humeral fracture)- Would like to be contacted after she recovers from surgery (maybe 1-2 weeks after d/c home)- Thank you!   Diet Carb Modified      Increase activity slowly        Medication List  As of 04/16/2012 11:17 AM   STOP taking these medications         naproxen 500 MG tablet      valsartan-hydrochlorothiazide 160-25 MG per tablet  TAKE these medications         clonazePAM 0.5 MG tablet   Commonly known as: KLONOPIN   Take 1 tablet (0.5 mg total) by mouth 2 (two) times daily.      diltiazem 180 MG 24 hr capsule   Commonly known as: DILACOR XR   Take 180 mg by mouth daily.      escitalopram 20 MG tablet   Commonly known as: LEXAPRO   Take 20 mg by mouth daily.      folic acid 1 MG tablet   Commonly known as: FOLVITE   Take 1 tablet (1 mg total) by mouth daily.      insulin detemir 100 UNIT/ML injection   Commonly known as: LEVEMIR   Inject 15 Units into the skin daily.       Insulin Pen Needle 29G X Misc   Dispense insulin pen needles      metFORMIN 500 MG tablet   Commonly known as: GLUCOPHAGE   Take 1 tablet (500 mg total) by mouth 2 (two) times daily with a meal.      methocarbamol 500 MG tablet   Commonly known as: ROBAXIN   Take 1 tablet (500 mg total) by mouth every 6 (six) hours as needed.      oxyCODONE-acetaminophen 5-325 MG per tablet   Commonly known as: PERCOCET/ROXICET   Take 1 tablet by mouth every 4 (four) hours as needed. For pain      thiamine 100 MG tablet   Take 1 tablet (100 mg total) by mouth daily.      vitamin B-12 1000 MCG tablet   Commonly known as: CYANOCOBALAMIN   Take 1,000 mcg by mouth daily.      vitamin C 500 MG tablet   Commonly known as: ASCORBIC ACID   Take 500 mg by mouth daily.           Follow-up Information    Schedule an appointment as soon as possible for a visit with FRIED, Doris Cheadle, MD.   Contact information:   [address] [place] [address] [phone]       Schedule an appointment as soon as possible for a visit with Verlee Rossetti, MD.   Contact information:   [facility] [address] [place] [address] [phone]           The results of significant diagnostics from this hospitalization (including imaging, microbiology, ancillary and laboratory) are listed below for reference.    Significant Diagnostic Studies: Dg Chest 1 View  04/11/2012  *RADIOLOGY REPORT*  Clinical Data: 56 year old female preoperative study for humerus fracture.  Hypertension.  CHEST - 1 VIEW  Comparison: 04/09/2012 and earlier.  Findings: Comminuted fracture of the proximal left humerus partially visible.  Stable lung volumes with chronic volume loss in the right hemithorax, pleural scarring, and mild elevation of the right hemidiaphragm.  Chronic post traumatic or postoperative changes to the right lateral ribs.  Mediastinal contours are stable and within  normal limits.  No pneumothorax, pulmonary edema or definite effusion.  IMPRESSION: 1.  Chronic changes to the right hemithorax. No acute cardiopulmonary abnormality. 2.  Partially visible comminuted proximal left humerus fracture.  Original Report Authenticated By: Harley Hallmark, M.D.   Ct Shoulder Left Wo Contrast  04/09/2012  *RADIOLOGY REPORT*  Clinical Data: Fall 04/05/2012 with shoulder fracture.  CT OF THE LEFT SHOULDER WITHOUT CONTRAST  Technique:  Multidetector CT imaging was performed according to the standard  protocol. Multiplanar CT image reconstructions were also generated.  Comparison: Plain films of the left humerus 04/06/2012.  Findings: As seen on patient's plain films, there is an impacted surgical neck fracture of left humerus with fragment override of approximately 2.5 cm.  The fracture is comminuted with the lesser tuberosity a separate fragment.  The fracture also extends into the anterior aspect of the greater tuberosity.  The humeral head is located.  The acromioclavicular joint is intact.  No other fracture is identified.  Soft tissue swelling and hematoma about the fracture is noted.  Imaged lung parenchyma is unremarkable.  IMPRESSION: Impacted surgical neck fracture of the left humerus involves both the greater and lesser tuberosities.  Original Report Authenticated By: Bernadene Bell. Maricela Curet, M.D.    Microbiology: Recent Results (from the past 240 hour(s))  SURGICAL PCR SCREEN     Status: Abnormal   Collection Time   04/11/12  1:55 PM      Component Value Range Status Comment   MRSA, PCR NEGATIVE  NEGATIVE Final    Staphylococcus aureus POSITIVE (*) NEGATIVE Final      Labs: Basic Metabolic Panel:  Lab 04/13/12 1610 04/12/12 0626 04/11/12 1900 04/11/12 1356  NA 134* 133* 131* 132*  K 3.7 4.1 4.3 5.1  CL 99 95* 90* 89*  CO2 19 21 20 21   GLUCOSE 226* 284* 388* 475*  BUN 33* 43* 44* 45*  CREATININE 0.67 0.97 1.18* 1.48*  CALCIUM 9.1 9.6 10.1 10.4  MG -- -- -- --   PHOS -- -- -- --   Liver Function Tests:  Lab 04/11/12 1900  AST 12  ALT 15  ALKPHOS 96  BILITOT 0.9  PROT 7.4  ALBUMIN 3.4*   No results found for this basename: LIPASE:5,AMYLASE:5 in the last 168 hours No results found for this basename: AMMONIA:5 in the last 168 hours CBC:  Lab 04/13/12 0605 04/12/12 0626 04/11/12 1356  WBC 4.6 6.7 11.0*  NEUTROABS -- -- --  HGB 12.4 12.2 13.4  HCT 36.0 35.6* 39.4  MCV 94.2 93.7 94.7  PLT 231 235 261   Cardiac Enzymes:  Lab 04/11/12 1900  CKTOTAL 29  CKMB 2.9  CKMBINDEX --  TROPONINI <0.30   BNP: BNP (last 3 results) No results found for this basename: PROBNP:3 in the last 8760 hours CBG:  Lab 04/16/12 1104 04/16/12 0708 04/15/12 2125 04/15/12 1638 04/15/12 1145  GLUCAP 176* 181* 200* 160* 205*    Time coordinating discharge: 40 minutes  Signed:  Aryianna Earwood  [facility] 04/16/2012, 11:17 AM

## 2012-04-18 NOTE — Progress Notes (Signed)
Utilization review completed. Lydiah Pong, RN, BSN. 

## 2012-05-08 ENCOUNTER — Encounter: Payer: Self-pay | Admitting: Dietician

## 2012-05-08 ENCOUNTER — Encounter: Payer: 59 | Attending: Family Medicine | Admitting: Dietician

## 2012-05-08 VITALS — Ht 68.0 in | Wt 173.2 lb

## 2012-05-08 DIAGNOSIS — E119 Type 2 diabetes mellitus without complications: Secondary | ICD-10-CM | POA: Insufficient documentation

## 2012-05-08 DIAGNOSIS — Z713 Dietary counseling and surveillance: Secondary | ICD-10-CM | POA: Insufficient documentation

## 2012-05-08 DIAGNOSIS — E118 Type 2 diabetes mellitus with unspecified complications: Secondary | ICD-10-CM

## 2012-05-08 NOTE — Progress Notes (Signed)
  Medical Nutrition Therapy:  Appt start time: 1615 end time:  1730.   Assessment:  Primary concerns today: New onset of DM type 2 when hospitalized for repair of LF humus.  Elevated glucose with A1c of 14.5% . Given insulin until glucose decreased, surgery and then DM education in the hospital.  Continues insulin using Levemir 15 units in the AM.  Has some questions regarding self-management and the dietary education.  Was not seen by the RD in the hospital.  Since coming home from the hospital, has attempted to eat better and the glucose levels reflect a general trend toward control.  Does note that her work has been quite stressful since the economic downturn.  She frequently will carry her lunch and usually eats at her desk while she works or more often will skip lunch, work through that time and then eat her lunch when she gets off work.    Blood Glucose: Fasting:90,105,94,79,98,122,116,109,111  2 hours after start of the Meal: 132,80,119,102,185,11,88,140   MEDICATIONS: medication review completed   DIETARY INTAKE:  Usual eating pattern includes 2 meals and 1 snacks per day.  Everyday foods include Generally eats a wide variety of foods.  Avoided foods include sugars, raw vegetables  24-hr recall: B: 10:00 AM low fat eggo waffle with promise margarine and sugar free syrup, water. Oatmeal, or instant grits, egg white omelets. Snk ( AM): none  L ( PM): 5:00 PM subway, cold cut, 6 inch then 2 hours later had the remainder.  Diet 7 Up Snk ( PM): none D ( PM): 5:00 PM subway, cold cut, 6 inch and 2 hours later had the remainder, Diet 7 Up Snk ( PM): Not currently, but has in the past Beverages: diet soda, water  Usual physical activity: currently in PT  Estimated energy needs:Ht:68 in  WT: 173.2 lb (note loss of 25 lb over the last 2 months) BMI: 26.4 kg/m2  Adj ST:151 lb (68 kg) 1400-1500 calories 160-165 g carbohydrates 105-110 g protein 38-40 g fat  Progress Towards Goal(s):   In progress.   Nutritional Diagnosis:  [place]-2.1 Inpaired nutrition utilization As related to glucose.  As evidenced by Diagnosis of type 2 diabetes with A1C of 14.5% and use of insulin at this time to assis with glucose control..    Intervention:  Nutrition Review of the carb restricted diet.  Review of the attributes of exercise/activity, stress control,and weight loss along with diet and medication for controlling blood glucose levels.  Handouts given during visit include:  Carb Counting Guide  Snack list  Menu examples for 30 and 45 gm CHO meals  ADA Recommendations for Standards of Care  Monitoring/Evaluation:  Dietary intake, exercise, blood glucose, and body weight In 8-12 weeks.  To call and set-up the appointment if she wishes to follow-up.  Can call or e-mail with questions.

## 2012-05-13 ENCOUNTER — Encounter: Payer: Self-pay | Admitting: Dietician

## 2013-05-01 ENCOUNTER — Emergency Department (HOSPITAL_COMMUNITY): Payer: Commercial Managed Care - PPO

## 2013-05-01 ENCOUNTER — Emergency Department (HOSPITAL_COMMUNITY)
Admission: EM | Admit: 2013-05-01 | Discharge: 2013-05-01 | Disposition: A | Discharge status: Home / Self Care | Payer: Commercial Managed Care - PPO | Attending: Emergency Medicine | Admitting: Emergency Medicine

## 2013-05-01 ENCOUNTER — Encounter (HOSPITAL_COMMUNITY): Payer: Self-pay | Admitting: Emergency Medicine

## 2013-05-01 DIAGNOSIS — Z794 Long term (current) use of insulin: Secondary | ICD-10-CM | POA: Insufficient documentation

## 2013-05-01 DIAGNOSIS — F29 Unspecified psychosis not due to a substance or known physiological condition: Secondary | ICD-10-CM | POA: Insufficient documentation

## 2013-05-01 DIAGNOSIS — Y929 Unspecified place or not applicable: Secondary | ICD-10-CM | POA: Insufficient documentation

## 2013-05-01 DIAGNOSIS — F411 Generalized anxiety disorder: Secondary | ICD-10-CM | POA: Insufficient documentation

## 2013-05-01 DIAGNOSIS — Z79899 Other long term (current) drug therapy: Secondary | ICD-10-CM | POA: Insufficient documentation

## 2013-05-01 DIAGNOSIS — T65891A Toxic effect of other specified substances, accidental (unintentional), initial encounter: Secondary | ICD-10-CM | POA: Insufficient documentation

## 2013-05-01 DIAGNOSIS — Z85118 Personal history of other malignant neoplasm of bronchus and lung: Secondary | ICD-10-CM | POA: Insufficient documentation

## 2013-05-01 DIAGNOSIS — I10 Essential (primary) hypertension: Secondary | ICD-10-CM | POA: Insufficient documentation

## 2013-05-01 DIAGNOSIS — Y9389 Activity, other specified: Secondary | ICD-10-CM | POA: Insufficient documentation

## 2013-05-01 DIAGNOSIS — F1010 Alcohol abuse, uncomplicated: Secondary | ICD-10-CM | POA: Insufficient documentation

## 2013-05-01 DIAGNOSIS — E119 Type 2 diabetes mellitus without complications: Secondary | ICD-10-CM | POA: Insufficient documentation

## 2013-05-01 LAB — CBC WITH DIFFERENTIAL/PLATELET
Basophils Relative: 2 % — ABNORMAL HIGH (ref 0–1)
Eosinophils Absolute: 0.2 10*3/uL (ref 0.0–0.7)
MCH: 33.5 pg (ref 26.0–34.0)
MCHC: 36.1 g/dL — ABNORMAL HIGH (ref 30.0–36.0)
Neutrophils Relative %: 48 % (ref 43–77)
Platelets: 214 10*3/uL (ref 150–400)

## 2013-05-01 LAB — COMPREHENSIVE METABOLIC PANEL
ALT: 45 U/L — ABNORMAL HIGH (ref 0–35)
Albumin: 3.3 g/dL — ABNORMAL LOW (ref 3.5–5.2)
Alkaline Phosphatase: 73 U/L (ref 39–117)
BUN: 22 mg/dL (ref 6–23)
Potassium: 4.2 mEq/L (ref 3.5–5.1)
Sodium: 131 mEq/L — ABNORMAL LOW (ref 135–145)
Total Protein: 6.8 g/dL (ref 6.0–8.3)

## 2013-05-01 LAB — RAPID URINE DRUG SCREEN, HOSP PERFORMED
Amphetamines: NOT DETECTED
Opiates: NOT DETECTED
Tetrahydrocannabinol: NOT DETECTED

## 2013-05-01 LAB — GLUCOSE, CAPILLARY: Glucose-Capillary: 137 mg/dL — ABNORMAL HIGH (ref 70–99)

## 2013-05-01 MED ORDER — SODIUM CHLORIDE 0.9 % IV BOLUS (SEPSIS)
1000.0000 mL | Freq: Once | INTRAVENOUS | Status: AC
  Administered 2013-05-01: 1000 mL via INTRAVENOUS

## 2013-05-01 NOTE — ED Notes (Signed)
Pt alert, arrives from home, presents via EMS with family, pt has been drinking Suave hair spray, two bottles pta, resp even unlabored, skin pwd, CBG was 140 en route, IV est #20 left hand

## 2013-05-01 NOTE — ED Provider Notes (Signed)
CSN: [id number]     Arrival date & time 05/01/13  1008 History     First MD Initiated Contact with Patient 05/01/13 1011     Chief Complaint  Patient presents with  . Poisoning    hair spray   (Consider location/radiation/quality/duration/timing/severity/associated sxs/prior Treatment) HPI 57 y.o. Female with history of alcohol abuse presents today after drinking 3-4 bottles of suave hairspray.  Patient last through detox but has relapsed multiple times intermittently.  Poison control was contacted by ems.  Suave hairspray has denatured alcohol int it. Denies other ingestion.  Son noted she was speaking in a confused way.  She is brought in secondary to this.    Past Medical History  Diagnosis Date  . Hypertension   . Alcohol abuse   . Pancreatitis, acute   . Anxiety   . Headache(784.0)   . Cancer     lung  . Diabetes mellitus    Past Surgical History  Procedure Laterality Date  . Appendectomy    . Lung lobectomy    . Cesarean section    . Joint replacement      rt shoulder  . Orif humerus fracture  04/13/2012    Procedure: OPEN REDUCTION INTERNAL FIXATION (ORIF) PROXIMAL HUMERUS FRACTURE;  Surgeon: Verlee Rossetti, MD;  Location: [facility] OR;  Service: Orthopedics;  Laterality: Left;  open reduction internal fixation left proximal humerus fracture  . Shoulder surgery     Family History  Problem Relation Age of Onset  . Hypertension Mother   . COPD Father   . Diabetes Father    History  Substance Use Topics  . Smoking status: Never Smoker   . Smokeless tobacco: Never Used  . Alcohol Use: Yes     Comment: occasional   OB History   Grav Para Term Preterm Abortions TAB SAB Ect Mult Living                 Review of Systems  Unable to perform ROS   Allergies  Ciprofloxacin  Home Medications   Current Outpatient Rx  Name  Route  Sig  Dispense  Refill  . busPIRone (BUSPAR) 10 MG tablet   Oral   Take 20 mg by mouth 2 (two) times daily.         . Candesartan  Cilexetil-HCTZ 32-25 MG TABS   Oral   Take 1 tablet by mouth every morning.         . diltiazem (DILACOR XR) 180 MG 24 hr capsule   Oral   Take 180 mg by mouth daily.         Marland Kitchen escitalopram (LEXAPRO) 20 MG tablet   Oral   Take 20 mg by mouth daily.         . insulin detemir (LEVEMIR FLEXPEN) 100 UNIT/ML injection   Subcutaneous   Inject 15 Units into the skin daily.   3 mL   12   . Insulin Pen Needle 29G X MISC      Dispense insulin pen needles   100 each   0   . metFORMIN (GLUCOPHAGE) 500 MG tablet   Oral   Take 1 tablet (500 mg total) by mouth 2 (two) times daily with a meal.   60 tablet   0   . pramipexole (MIRAPEX) 1 MG tablet   Oral   Take 0.5 mg by mouth daily with supper.         . traZODone (DESYREL) 100 MG tablet   Oral  Take 100 mg by mouth at bedtime.         . vitamin B-12 (CYANOCOBALAMIN) 1000 MCG tablet   Oral   Take 1,000 mcg by mouth daily.         . vitamin C (ASCORBIC ACID) 500 MG tablet   Oral   Take 500 mg by mouth daily.          BP 125/73  Pulse 94  Temp(Src) 98.3 F (36.8 C) (Oral)  Resp 20  SpO2 98% Physical Exam  Nursing note and vitals reviewed. Constitutional: She is oriented to person, place, and time. She appears well-developed and well-nourished.  HENT:  Mucous membranes dry  Eyes: Conjunctivae and EOM are normal. Pupils are equal, round, and reactive to light.  Neck: Normal range of motion. Neck supple.  Cardiovascular: Normal rate, regular rhythm, normal heart sounds and intact distal pulses.   Pulmonary/Chest: Effort normal and breath sounds normal.  Abdominal: Soft. Bowel sounds are normal.  Musculoskeletal: Normal range of motion.  Neurological: She is alert and oriented to person, place, and time. She has normal reflexes.  Skin: Skin is warm and dry.    ED Course   Procedures (including critical care time)  Labs Reviewed  CBC WITH DIFFERENTIAL - Abnormal; Notable for the following:    WBC  3.8 (*)    MCHC 36.1 (*)    Basophils Relative 2 (*)    All other components within normal limits  COMPREHENSIVE METABOLIC PANEL - Abnormal; Notable for the following:    Sodium 131 (*)    Chloride 91 (*)    Glucose, Bld 136 (*)    Albumin 3.3 (*)    AST 61 (*)    ALT 45 (*)    Total Bilirubin 0.2 (*)    GFR calc non Af Amer 77 (*)    GFR calc Af Amer 90 (*)    All other components within normal limits  ETHANOL - Abnormal; Notable for the following:    Alcohol, Ethyl (B) 363 (*)    All other components within normal limits  GLUCOSE, CAPILLARY - Abnormal; Notable for the following:    Glucose-Capillary 137 (*)    All other components within normal limits  URINE RAPID DRUG SCREEN (HOSP PERFORMED)   Dg Chest 2 View  05/01/2013   *RADIOLOGY REPORT*  Clinical Data: Poisoning.  CHEST - 2 VIEW  Comparison: Chest x-Jennessa Trigo 04/11/2012.  Findings: Lung volumes are slightly low.  Mild elevation of the right hemidiaphragm is unchanged.  No definite acute consolidative airspace disease.  Minimal blunting of the right costophrenic sulcus may represent chronic pleural parenchymal scarring.  No left pleural effusion.  Mild crowding of the pulmonary vasculature, accentuated by low lung volumes, without frank pulmonary edema. Heart size is normal.  The patient is rotated to the left on today's exam, resulting in distortion of the mediastinal contours and reduced diagnostic sensitivity and specificity for mediastinal pathology.  Postoperative changes of right middle and lower lobectomy are noted.  Status post right shoulder hemiarthroplasty.  IMPRESSION: 1.  Mild pulmonary venous congestion, without frank pulmonary edema. 2.  Status post right middle and lower lobectomy with chronic postoperative appearance of the right hemithorax similar to prior studies.   Original Report Authenticated By: Trudie Reed, M.D.   Ct Head Wo Contrast  05/01/2013   *RADIOLOGY REPORT*  Clinical Data: Altered mental status  CT HEAD  WITHOUT CONTRAST  Technique:  Contiguous axial images were obtained from the base of the skull  through the vertex without contrast. Study was obtained within 24 hours of patient arrival at the emergency department.  Comparison: None.  Findings:  There is mild diffuse atrophy.  There is no mass, hemorrhage, extra-axial fluid collection, or midline shift.  There is a small focus of decreased attenuation in the anterior mid right centrum semiovale.  This finding is consistent with a small age uncertain infarct in the white matter.  Elsewhere, the gray- white compartment is appear within normal limits.  Bony calvarium appears intact.  The mastoid air cells are clear.  IMPRESSION: Small age uncertain infarct in the anterior mid right centrum semiovale.  No hemorrhage.  Mild atrophy.  Study otherwise unremarkable.   Original Report Authenticated By: Bretta Bang, M.D.   No diagnosis found. Results for orders placed during the hospital encounter of 05/01/13  CBC WITH DIFFERENTIAL      Result Value Range   WBC 3.8 (*) 4.0 - 10.5 K/uL   RBC 4.15  3.87 - 5.11 MIL/uL   Hemoglobin 13.9  12.0 - 15.0 g/dL   HCT 98.1  19.1 - 47.8 %   MCV 92.8  78.0 - 100.0 fL   MCH 33.5  26.0 - 34.0 pg   MCHC 36.1 (*) 30.0 - 36.0 g/dL   RDW 29.5  62.1 - 30.8 %   Platelets 214  150 - 400 K/uL   Neutrophils Relative % 48  43 - 77 %   Neutro Abs 1.8  1.7 - 7.7 K/uL   Lymphocytes Relative 36  12 - 46 %   Lymphs Abs 1.4  0.7 - 4.0 K/uL   Monocytes Relative 10  3 - 12 %   Monocytes Absolute 0.4  0.1 - 1.0 K/uL   Eosinophils Relative 5  0 - 5 %   Eosinophils Absolute 0.2  0.0 - 0.7 K/uL   Basophils Relative 2 (*) 0 - 1 %   Basophils Absolute 0.1  0.0 - 0.1 K/uL  COMPREHENSIVE METABOLIC PANEL      Result Value Range   Sodium 131 (*) 135 - 145 mEq/L   Potassium 4.2  3.5 - 5.1 mEq/L   Chloride 91 (*) 96 - 112 mEq/L   CO2 23  19 - 32 mEq/L   Glucose, Bld 136 (*) 70 - 99 mg/dL   BUN 22  6 - 23 mg/dL   Creatinine, Ser 6.57   0.50 - 1.10 mg/dL   Calcium 9.5  8.4 - 84.6 mg/dL   Total Protein 6.8  6.0 - 8.3 g/dL   Albumin 3.3 (*) 3.5 - 5.2 g/dL   AST 61 (*) 0 - 37 U/L   ALT 45 (*) 0 - 35 U/L   Alkaline Phosphatase 73  39 - 117 U/L   Total Bilirubin 0.2 (*) 0.3 - 1.2 mg/dL   GFR calc non Af Amer 77 (*) >90 mL/min   GFR calc Af Amer 90 (*) >90 mL/min  URINE RAPID DRUG SCREEN (HOSP PERFORMED)      Result Value Range   Opiates NONE DETECTED  NONE DETECTED   Cocaine NONE DETECTED  NONE DETECTED   Benzodiazepines NONE DETECTED  NONE DETECTED   Amphetamines NONE DETECTED  NONE DETECTED   Tetrahydrocannabinol NONE DETECTED  NONE DETECTED   Barbiturates NONE DETECTED  NONE DETECTED  ETHANOL      Result Value Range   Alcohol, Ethyl (B) 363 (*) 0 - 11 mg/dL  GLUCOSE, CAPILLARY      Result Value Range   Glucose-Capillary 137 (*)  70 - 99 mg/dL   Comment 1 Notify RN      MDM   Etoh elevated at 363.  Patient stable here.  She is ambulatory without difficulty.  Plan d/c to follow up with patient.   Hilario Quarry, MD 05/01/13 [phone]

## 2013-05-01 NOTE — ED Notes (Signed)
ZOX:[id number]<AV> Expected date:<BR> Expected time:<BR> Means of arrival:<BR> Comments:<BR> ems - OD drank 2 bottles of hairspray

## 2013-07-08 IMAGING — CT CT SHOULDER*L* W/O CM
3 series · 8 of 14 positions shown, 9 images · non-contrast
Comparison: Plain films of the left humerus 04/06/2012.

CLINICAL DATA: Fall 04/05/2012 with shoulder fracture.

CT OF THE LEFT SHOULDER WITHOUT CONTRAST
TECHNIQUE: Multidetector CT imaging was performed according to the
standard protocol. Multiplanar CT image reconstructions were also
generated.

[Series 3: shoulder/standard · axial · 0.28mm/px · z∈[-150,-53]mm · 4 of 67 slices shown]
[im 14/67  soft-tissue]
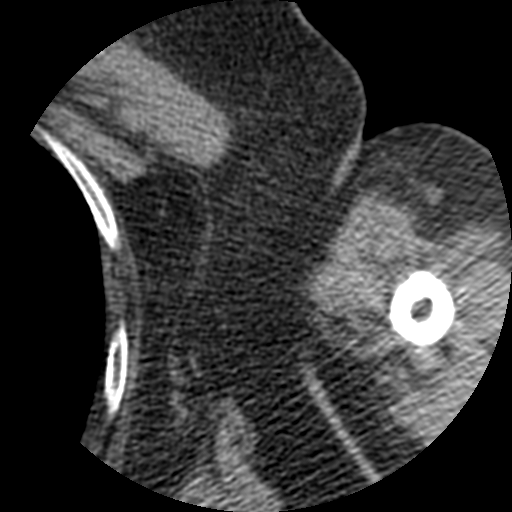
[im 27/67  soft-tissue]
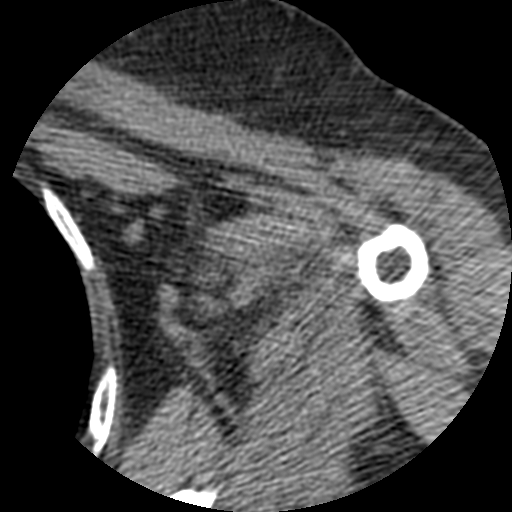
[im 40/67  soft-tissue]
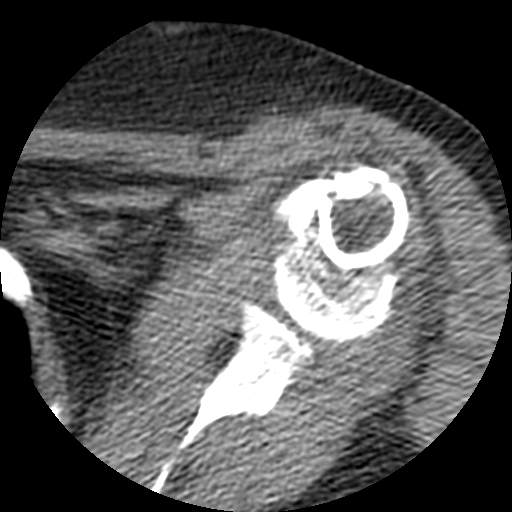
[im 53/67  soft-tissue]
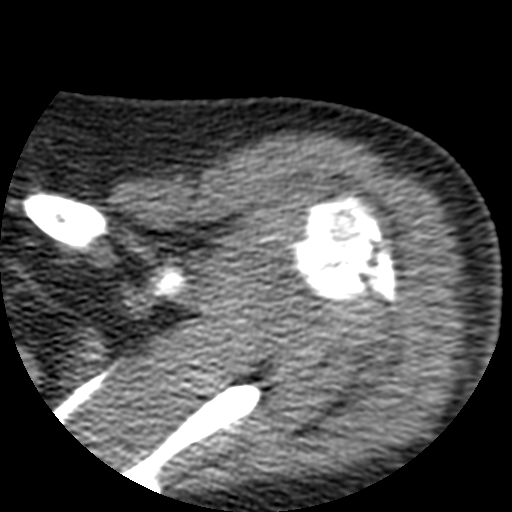

[Series 400: coronal · coronal · 0.33mm/px · 2 of 46 slices shown, 3 images]
[im 16/46  soft-tissue]
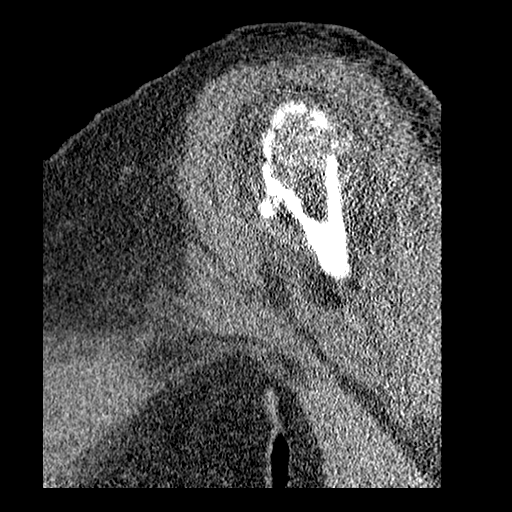
[im 16/46  bone]
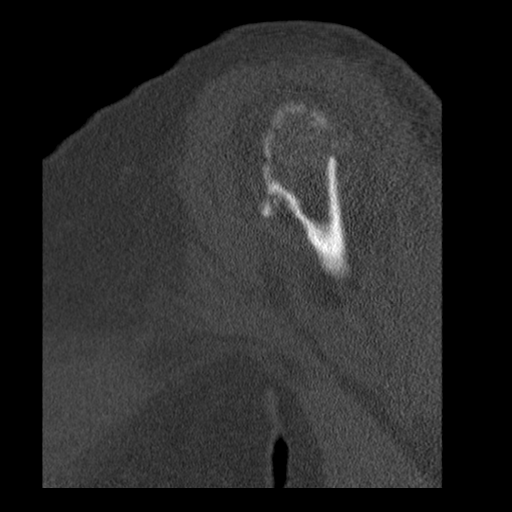
[im 31/46  bone]
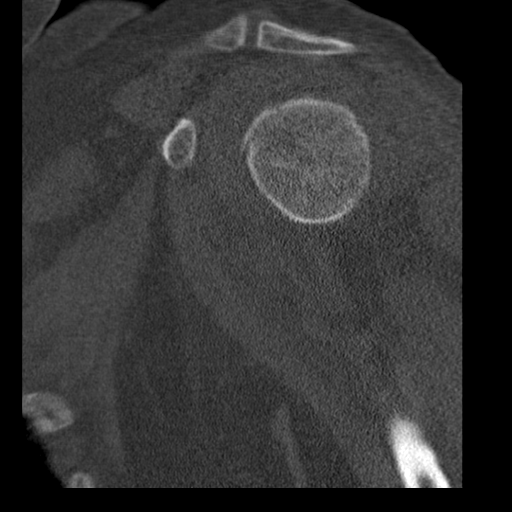

[Series 401: sagittal · sagittal · 0.33mm/px · 2 of 48 slices shown]
[im 16/48  bone]
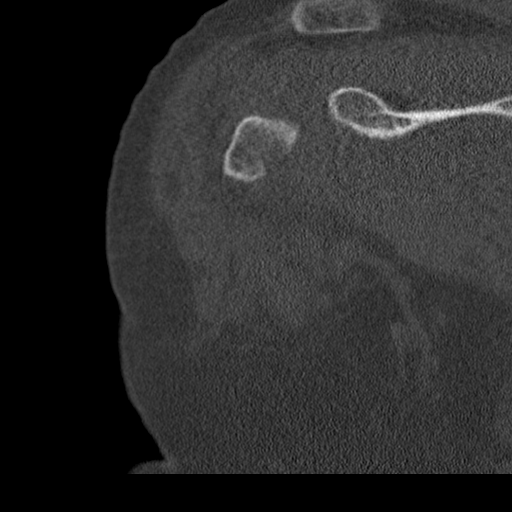
[im 32/48  bone]
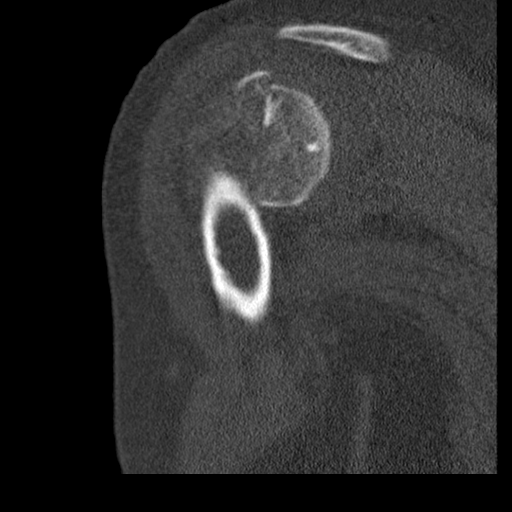

[8 of 14 positions shown; findings below may reference images not displayed]

FINDINGS: As seen on patient's plain films, there is an impacted
surgical neck fracture of left humerus with fragment override of
approximately 2.5 cm.  The fracture is comminuted with the lesser
tuberosity a separate fragment.  The fracture also extends into the
anterior aspect of the greater tuberosity.  The humeral head is
located.  The acromioclavicular joint is intact.  No other fracture
is identified.  Soft tissue swelling and hematoma about the
fracture is noted.  Imaged lung parenchyma is unremarkable.
IMPRESSION: Impacted surgical neck fracture of the left humerus involves both
the greater and lesser tuberosities.

## 2013-07-10 IMAGING — CR DG CHEST 1V
1 series · 1 of 1 positions shown · non-contrast
Comparison: 04/09/2012 and earlier.

CLINICAL DATA: 55-year-old female preoperative study for humerus
fracture.  Hypertension.

CHEST - 1 VIEW

[view not recorded]
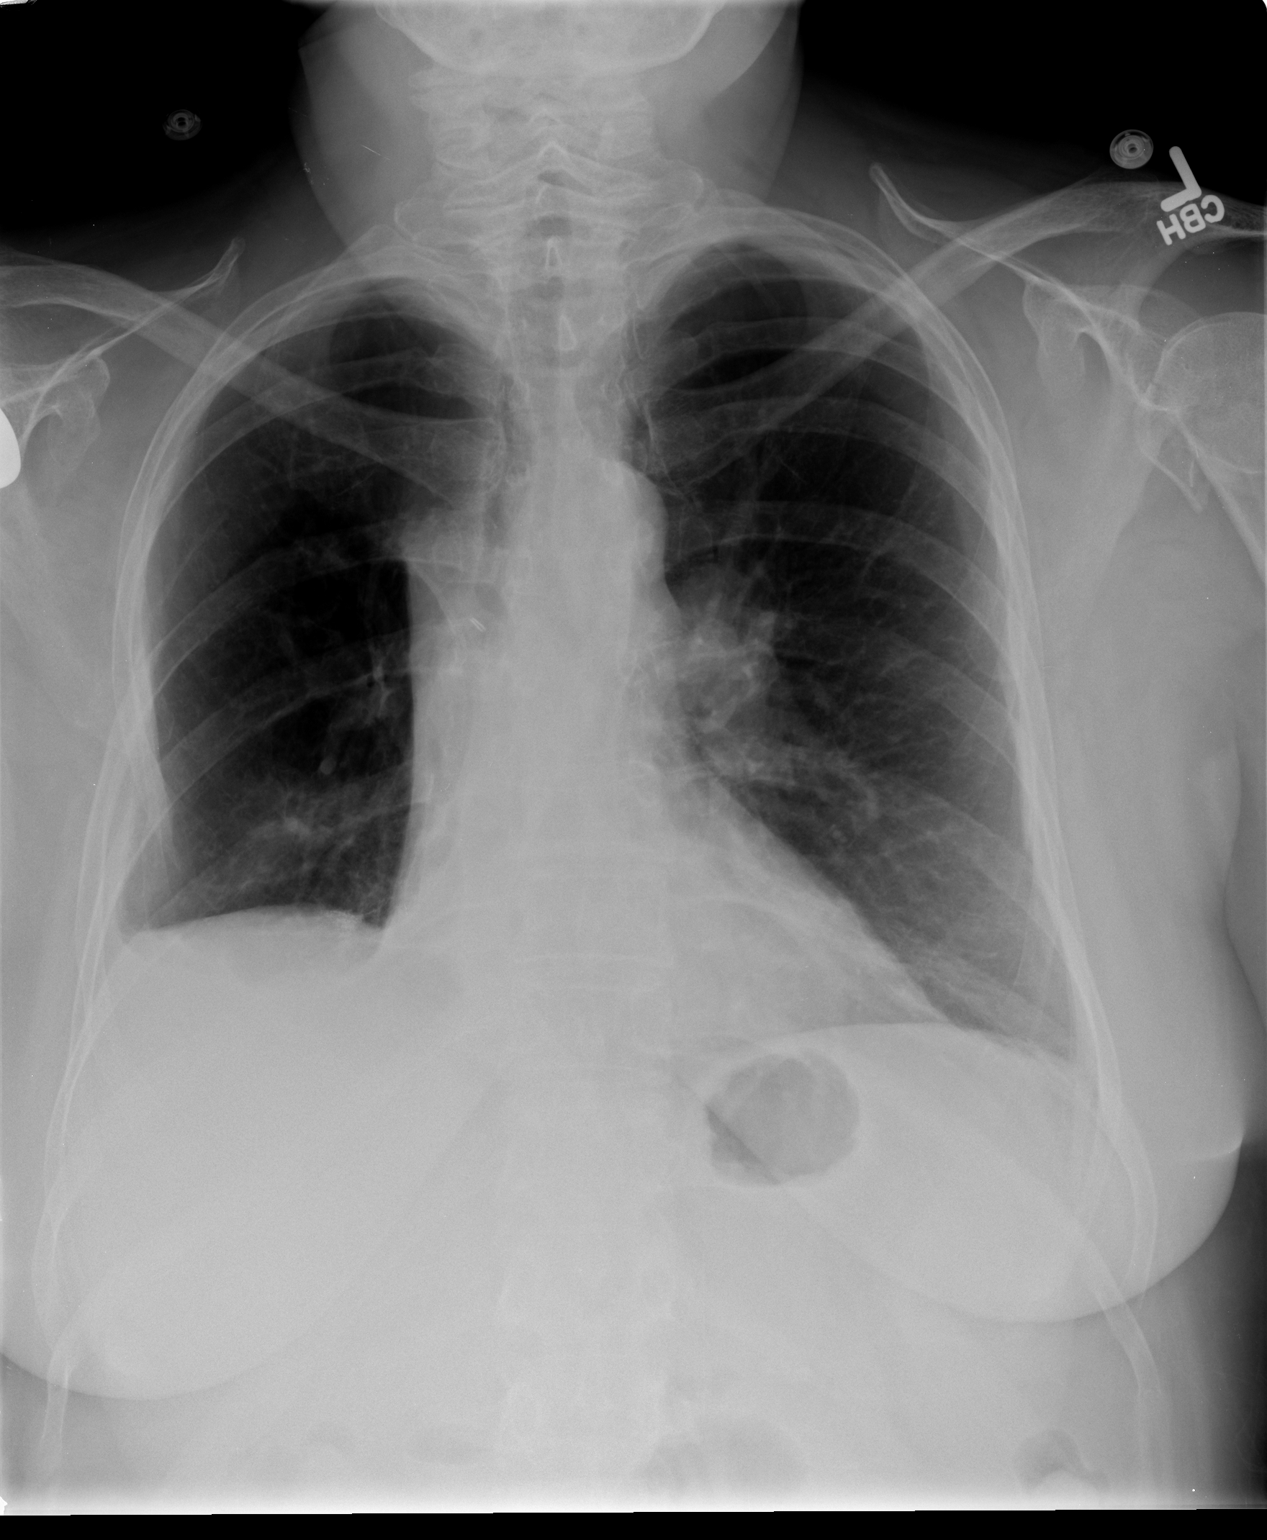

[1 of 1 positions shown; findings below may reference images not displayed]

FINDINGS: Comminuted fracture of the proximal left humerus
partially visible.

Stable lung volumes with chronic volume loss in the right
hemithorax, pleural scarring, and mild elevation of the right
hemidiaphragm.  Chronic post traumatic or postoperative changes to
the right lateral ribs.  Mediastinal contours are stable and within
normal limits.  No pneumothorax, pulmonary edema or definite
effusion.
IMPRESSION: 1.  Chronic changes to the right hemithorax. No acute
cardiopulmonary abnormality.
2.  Partially visible comminuted proximal left humerus fracture.

## 2014-10-09 ENCOUNTER — Encounter (HOSPITAL_COMMUNITY): Payer: Self-pay | Admitting: Orthopedic Surgery

## 2015-05-07 ENCOUNTER — Other Ambulatory Visit: Payer: Self-pay | Admitting: Gastroenterology

## 2016-04-06 ENCOUNTER — Ambulatory Visit: Payer: Commercial Managed Care - PPO | Admitting: Podiatry

## 2016-04-14 ENCOUNTER — Ambulatory Visit: Payer: Commercial Managed Care - PPO | Admitting: Podiatry

## 2016-04-27 ENCOUNTER — Encounter: Payer: Self-pay | Admitting: Podiatry

## 2016-04-27 ENCOUNTER — Ambulatory Visit (INDEPENDENT_AMBULATORY_CARE_PROVIDER_SITE_OTHER): Payer: Commercial Managed Care - PPO | Admitting: Podiatry

## 2016-04-27 VITALS — BP 152/92 | HR 86 | Resp 14

## 2016-04-27 DIAGNOSIS — L84 Corns and callosities: Secondary | ICD-10-CM | POA: Diagnosis not present

## 2016-04-27 DIAGNOSIS — B351 Tinea unguium: Secondary | ICD-10-CM

## 2016-04-27 DIAGNOSIS — L97511 Non-pressure chronic ulcer of other part of right foot limited to breakdown of skin: Secondary | ICD-10-CM | POA: Diagnosis not present

## 2016-04-27 DIAGNOSIS — M79676 Pain in unspecified toe(s): Secondary | ICD-10-CM | POA: Diagnosis not present

## 2016-04-27 NOTE — Progress Notes (Signed)
   Subjective:    Patient ID: [name], female    DOB: [birth year], 60 y.o.   MRN: [id number]  HPI this patient presents the office with chief complaint of a ulcer on the tip of her right big toe. He states that she was seen by Dr. Buddy Duty who told her to apply Polysporin to the lesion on the tip of her right toe. She states that the skin lesion has now stopped draining, but has multiple skin layers on the tip of the right big toe. She denies any drainage, redness or infection. She also says that she is experiencing pain in both feet due to the thickness of the toenails. Patient states that the toenails become painful walking and wearing her shoes. This patient presents the office today for evaluation and treatment of her diabetic feet    Review of Systems  All other systems reviewed and are negative.      Objective:   Physical Exam GENERAL APPEARANCE: Alert, conversant. Appropriately groomed. No acute distress.  VASCULAR: Pedal pulses are  palpable at  [facility] and PT bilateral.  Capillary refill time is immediate to all digits,  Normal temperature gradient.  Digital hair growth is present bilateral  NEUROLOGIC: sensation is diminished  to 5.07 monofilament at 5/5 sites bilateral.  Light touch is intact bilateral, Muscle strength normal.  MUSCULOSKELETAL: acceptable muscle strength, tone and stability bilateral.  Intrinsic muscluature intact bilateral.  Rectus appearance of foot and digits noted bilateral.   DERMATOLOGIC: skin color, texture, and turgor are within normal limits.  No preulcerative lesions or ulcers  are seen, no interdigital maceration noted.  No open lesions present.   No drainage noted. There is a closed skin lesion on the tip of great toe right foot.  No redness or swelling or drainage noted.  NAILS  Thick disfigured discolored nails x 6.             Assessment & Plan:  Onychomycosis B/L  S/P ulcer right hallux.   IE  Debridement of onychomycotic nails.  Debride  callus right hallux. Discussed the healed ulcer on the right great toe. I believe it may have developed due to the shoes that she wears and the thickness of the nail, which pushes  on the tip of the right great toe.    RTC 10 weeks.   [name] DPM

## 2016-07-06 ENCOUNTER — Encounter: Payer: Self-pay | Admitting: Podiatry

## 2016-07-06 ENCOUNTER — Ambulatory Visit (INDEPENDENT_AMBULATORY_CARE_PROVIDER_SITE_OTHER): Payer: Commercial Managed Care - PPO | Admitting: Podiatry

## 2016-07-06 DIAGNOSIS — B351 Tinea unguium: Secondary | ICD-10-CM

## 2016-07-06 DIAGNOSIS — M79676 Pain in unspecified toe(s): Secondary | ICD-10-CM

## 2016-07-06 NOTE — Progress Notes (Signed)
Complaint:  Visit Type: Patient returns to my office for continued preventative foot care services. Complaint: Patient states" my nails have grown long and thick and become painful to walk and wear shoes" Patient has been diagnosed with DM with no foot complications. The patient presents for preventative foot care services. No changes to ROS  Podiatric Exam: Vascular: dorsalis pedis and posterior tibial pulses are palpable bilateral. Capillary return is immediate. Temperature gradient is WNL. Skin turgor WNL  Sensorium: Diminished  Semmes Weinstein monofilament test. Normal tactile sensation bilaterally. Nail Exam: Pt has thick disfigured discolored nails with subungual debris noted bilateral entire nail hallux through fifth toenails Ulcer Exam: There is no evidence of ulcer or pre-ulcerative changes or infection. Orthopedic Exam: Muscle tone and strength are WNL. No limitations in general ROM. No crepitus or effusions noted. Foot type and digits show no abnormalities. Bony prominences are unremarkable. Skin: No Porokeratosis. No infection or ulcers  Diagnosis:  Onychomycosis, , Pain in right toe, pain in left toes  Treatment & Plan Procedures and Treatment: Consent by patient was obtained for treatment procedures. The patient understood the discussion of treatment and procedures well. All questions were answered thoroughly reviewed. Debridement of mycotic and hypertrophic toenails, 1 through 5 bilateral and clearing of subungual debris. No ulceration, no infection noted.  Return Visit-Office Procedure: Patient instructed to return to the office for a follow up visit 3 months for continued evaluation and treatment.    Cortez Steelman DPM 

## 2016-10-05 ENCOUNTER — Ambulatory Visit: Payer: Commercial Managed Care - PPO | Admitting: Podiatry

## 2016-10-19 ENCOUNTER — Encounter: Payer: Self-pay | Admitting: Podiatry

## 2016-10-19 ENCOUNTER — Ambulatory Visit (INDEPENDENT_AMBULATORY_CARE_PROVIDER_SITE_OTHER): Payer: Commercial Managed Care - PPO | Admitting: Podiatry

## 2016-10-19 DIAGNOSIS — B351 Tinea unguium: Secondary | ICD-10-CM | POA: Diagnosis not present

## 2016-10-19 DIAGNOSIS — M79676 Pain in unspecified toe(s): Secondary | ICD-10-CM | POA: Diagnosis not present

## 2016-10-19 NOTE — Progress Notes (Signed)
Complaint:  Visit Type: Patient returns to my office for continued preventative foot care services. Complaint: Patient states" my nails have grown long and thick and become painful to walk and wear shoes" Patient has been diagnosed with DM with no foot complications. The patient presents for preventative foot care services. No changes to ROS  Podiatric Exam: Vascular: dorsalis pedis and posterior tibial pulses are palpable bilateral. Capillary return is immediate. Temperature gradient is WNL. Skin turgor WNL  Sensorium: Diminished  Semmes Weinstein monofilament test. Normal tactile sensation bilaterally. Nail Exam: Pt has thick disfigured discolored nails with subungual debris noted bilateral entire nail hallux through fifth toenails Ulcer Exam: There is no evidence of ulcer or pre-ulcerative changes or infection. Orthopedic Exam: Muscle tone and strength are WNL. No limitations in general ROM. No crepitus or effusions noted. Foot type and digits show no abnormalities. Bony prominences are unremarkable. Skin: No Porokeratosis. No infection or ulcers  Diagnosis:  Onychomycosis, , Pain in right toe, pain in left toes  Treatment & Plan Procedures and Treatment: Consent by patient was obtained for treatment procedures. The patient understood the discussion of treatment and procedures well. All questions were answered thoroughly reviewed. Debridement of mycotic and hypertrophic toenails, 1 through 5 bilateral and clearing of subungual debris. No ulceration, no infection noted.  Return Visit-Office Procedure: Patient instructed to return to the office for a follow up visit 3 months for continued evaluation and treatment.    Edie Vallandingham DPM 

## 2016-11-03 DIAGNOSIS — R51 Headache: Secondary | ICD-10-CM | POA: Diagnosis not present

## 2016-11-03 DIAGNOSIS — G4700 Insomnia, unspecified: Secondary | ICD-10-CM | POA: Diagnosis not present

## 2016-11-03 DIAGNOSIS — I10 Essential (primary) hypertension: Secondary | ICD-10-CM | POA: Diagnosis not present

## 2016-11-14 DIAGNOSIS — E785 Hyperlipidemia, unspecified: Secondary | ICD-10-CM | POA: Diagnosis not present

## 2016-11-14 DIAGNOSIS — E1165 Type 2 diabetes mellitus with hyperglycemia: Secondary | ICD-10-CM | POA: Diagnosis not present

## 2016-11-14 DIAGNOSIS — Z794 Long term (current) use of insulin: Secondary | ICD-10-CM | POA: Diagnosis not present

## 2016-12-04 DIAGNOSIS — H52223 Regular astigmatism, bilateral: Secondary | ICD-10-CM | POA: Diagnosis not present

## 2017-01-11 ENCOUNTER — Ambulatory Visit: Payer: Commercial Managed Care - PPO | Admitting: Podiatry

## 2017-02-01 ENCOUNTER — Ambulatory Visit: Payer: Commercial Managed Care - PPO | Admitting: Podiatry

## 2017-02-23 ENCOUNTER — Ambulatory Visit: Payer: Commercial Managed Care - PPO | Admitting: Podiatry

## 2017-02-27 DIAGNOSIS — Z794 Long term (current) use of insulin: Secondary | ICD-10-CM | POA: Diagnosis not present

## 2017-02-27 DIAGNOSIS — E119 Type 2 diabetes mellitus without complications: Secondary | ICD-10-CM | POA: Diagnosis not present

## 2017-02-27 DIAGNOSIS — E785 Hyperlipidemia, unspecified: Secondary | ICD-10-CM | POA: Diagnosis not present

## 2017-03-14 ENCOUNTER — Ambulatory Visit: Payer: Commercial Managed Care - PPO | Admitting: Podiatry

## 2017-04-04 ENCOUNTER — Ambulatory Visit (INDEPENDENT_AMBULATORY_CARE_PROVIDER_SITE_OTHER): Payer: Commercial Managed Care - PPO | Admitting: Podiatry

## 2017-04-04 ENCOUNTER — Encounter: Payer: Self-pay | Admitting: Podiatry

## 2017-04-04 DIAGNOSIS — M79676 Pain in unspecified toe(s): Secondary | ICD-10-CM | POA: Diagnosis not present

## 2017-04-04 DIAGNOSIS — B351 Tinea unguium: Secondary | ICD-10-CM

## 2017-04-04 NOTE — Progress Notes (Signed)
Complaint:  Visit Type: Patient returns to my office for continued preventative foot care services. Complaint: Patient states" my nails have grown long and thick and become painful to walk and wear shoes" Patient has been diagnosed with DM with no foot complications. The patient presents for preventative foot care services. No changes to ROS  Podiatric Exam: Vascular: dorsalis pedis and posterior tibial pulses are palpable bilateral. Capillary return is immediate. Temperature gradient is WNL. Skin turgor WNL  Sensorium: Diminished  Semmes Weinstein monofilament test. Normal tactile sensation bilaterally. Nail Exam: Pt has thick disfigured discolored nails with subungual debris noted bilateral entire nail hallux through fifth toenails Ulcer Exam: There is no evidence of ulcer or pre-ulcerative changes or infection. Orthopedic Exam: Muscle tone and strength are WNL. No limitations in general ROM. No crepitus or effusions noted. Foot type and digits show no abnormalities. Bony prominences are unremarkable. Skin: No Porokeratosis. No infection or ulcers  Diagnosis:  Onychomycosis, , Pain in right toe, pain in left toes  Treatment & Plan Procedures and Treatment: Consent by patient was obtained for treatment procedures. The patient understood the discussion of treatment and procedures well. All questions were answered thoroughly reviewed. Debridement of mycotic and hypertrophic toenails, 1 through 5 bilateral and clearing of subungual debris. No ulceration, no infection noted.  Return Visit-Office Procedure: Patient instructed to return to the office for a follow up visit 3 months for continued evaluation and treatment.    Dimetri Armitage DPM 

## 2017-05-04 DIAGNOSIS — E785 Hyperlipidemia, unspecified: Secondary | ICD-10-CM | POA: Diagnosis not present

## 2017-05-04 DIAGNOSIS — E119 Type 2 diabetes mellitus without complications: Secondary | ICD-10-CM | POA: Diagnosis not present

## 2017-05-04 DIAGNOSIS — I10 Essential (primary) hypertension: Secondary | ICD-10-CM | POA: Diagnosis not present

## 2017-07-04 ENCOUNTER — Ambulatory Visit: Payer: Commercial Managed Care - PPO | Admitting: Podiatry

## 2017-07-21 ENCOUNTER — Ambulatory Visit (INDEPENDENT_AMBULATORY_CARE_PROVIDER_SITE_OTHER): Payer: Commercial Managed Care - PPO | Admitting: Podiatry

## 2017-07-21 ENCOUNTER — Encounter: Payer: Self-pay | Admitting: Podiatry

## 2017-07-21 DIAGNOSIS — M79676 Pain in unspecified toe(s): Secondary | ICD-10-CM

## 2017-07-21 DIAGNOSIS — B351 Tinea unguium: Secondary | ICD-10-CM | POA: Diagnosis not present

## 2017-07-21 DIAGNOSIS — E1142 Type 2 diabetes mellitus with diabetic polyneuropathy: Secondary | ICD-10-CM

## 2017-07-21 NOTE — Progress Notes (Signed)
Complaint:  Visit Type: Patient returns to my office for continued preventative foot care services. Complaint: Patient states" my nails have grown long and thick and become painful to walk and wear shoes" Patient has been diagnosed with DM with no foot complications. The patient presents for preventative foot care services. No changes to ROS  Podiatric Exam: Vascular: dorsalis pedis and posterior tibial pulses are palpable bilateral. Capillary return is immediate. Temperature gradient is WNL. Skin turgor WNL  Sensorium: Diminished  Semmes Weinstein monofilament test. Normal tactile sensation bilaterally. Nail Exam: Pt has thick disfigured discolored nails with subungual debris noted bilateral entire nail hallux through fifth toenails Ulcer Exam: There is no evidence of ulcer or pre-ulcerative changes or infection. Orthopedic Exam: Muscle tone and strength are WNL. No limitations in general ROM. No crepitus or effusions noted. Foot type and digits show no abnormalities. Bony prominences are unremarkable. Skin: No Porokeratosis. No infection or ulcers  Diagnosis:  Onychomycosis, , Pain in right toe, pain in left toes  Treatment & Plan Procedures and Treatment: Consent by patient was obtained for treatment procedures. The patient understood the discussion of treatment and procedures well. All questions were answered thoroughly reviewed. Debridement of mycotic and hypertrophic toenails, 1 through 5 bilateral and clearing of subungual debris. No ulceration, no infection noted.  Return Visit-Office Procedure: Patient instructed to return to the office for a follow up visit 3 months for continued evaluation and treatment.    Leeloo Silverthorne DPM 

## 2017-08-01 DIAGNOSIS — Z794 Long term (current) use of insulin: Secondary | ICD-10-CM | POA: Diagnosis not present

## 2017-08-01 DIAGNOSIS — E785 Hyperlipidemia, unspecified: Secondary | ICD-10-CM | POA: Diagnosis not present

## 2017-08-01 DIAGNOSIS — Z23 Encounter for immunization: Secondary | ICD-10-CM | POA: Diagnosis not present

## 2017-08-01 DIAGNOSIS — E119 Type 2 diabetes mellitus without complications: Secondary | ICD-10-CM | POA: Diagnosis not present

## 2017-10-18 ENCOUNTER — Ambulatory Visit: Payer: Commercial Managed Care - PPO | Admitting: Podiatry

## 2017-11-01 DIAGNOSIS — G2581 Restless legs syndrome: Secondary | ICD-10-CM | POA: Diagnosis not present

## 2017-11-01 DIAGNOSIS — I10 Essential (primary) hypertension: Secondary | ICD-10-CM | POA: Diagnosis not present

## 2017-11-15 ENCOUNTER — Ambulatory Visit: Payer: Commercial Managed Care - PPO | Admitting: Podiatry

## 2017-11-29 ENCOUNTER — Ambulatory Visit: Payer: Commercial Managed Care - PPO | Admitting: Podiatry

## 2017-12-20 ENCOUNTER — Ambulatory Visit: Payer: Commercial Managed Care - PPO | Admitting: Podiatry

## 2018-01-09 ENCOUNTER — Ambulatory Visit: Payer: Commercial Managed Care - PPO | Admitting: Podiatry

## 2018-03-06 DIAGNOSIS — H52223 Regular astigmatism, bilateral: Secondary | ICD-10-CM | POA: Diagnosis not present

## 2018-05-01 DIAGNOSIS — I10 Essential (primary) hypertension: Secondary | ICD-10-CM | POA: Diagnosis not present

## 2018-05-01 DIAGNOSIS — G2581 Restless legs syndrome: Secondary | ICD-10-CM | POA: Diagnosis not present

## 2018-05-07 DIAGNOSIS — E785 Hyperlipidemia, unspecified: Secondary | ICD-10-CM | POA: Diagnosis not present

## 2018-05-07 DIAGNOSIS — E119 Type 2 diabetes mellitus without complications: Secondary | ICD-10-CM | POA: Diagnosis not present

## 2018-05-07 DIAGNOSIS — Z794 Long term (current) use of insulin: Secondary | ICD-10-CM | POA: Diagnosis not present

## 2018-06-04 DIAGNOSIS — E119 Type 2 diabetes mellitus without complications: Secondary | ICD-10-CM | POA: Diagnosis not present

## 2018-06-04 DIAGNOSIS — Z794 Long term (current) use of insulin: Secondary | ICD-10-CM | POA: Diagnosis not present

## 2018-06-07 DIAGNOSIS — Z23 Encounter for immunization: Secondary | ICD-10-CM | POA: Diagnosis not present

## 2018-06-25 DIAGNOSIS — E1165 Type 2 diabetes mellitus with hyperglycemia: Secondary | ICD-10-CM | POA: Diagnosis not present

## 2018-06-25 DIAGNOSIS — Z794 Long term (current) use of insulin: Secondary | ICD-10-CM | POA: Diagnosis not present

## 2018-06-25 DIAGNOSIS — E119 Type 2 diabetes mellitus without complications: Secondary | ICD-10-CM | POA: Diagnosis not present

## 2018-08-20 DIAGNOSIS — E785 Hyperlipidemia, unspecified: Secondary | ICD-10-CM | POA: Diagnosis not present

## 2018-08-20 DIAGNOSIS — E119 Type 2 diabetes mellitus without complications: Secondary | ICD-10-CM | POA: Diagnosis not present

## 2018-08-20 DIAGNOSIS — Z794 Long term (current) use of insulin: Secondary | ICD-10-CM | POA: Diagnosis not present

## 2018-08-20 DIAGNOSIS — Z79899 Other long term (current) drug therapy: Secondary | ICD-10-CM | POA: Diagnosis not present

## 2018-11-02 DIAGNOSIS — G2581 Restless legs syndrome: Secondary | ICD-10-CM | POA: Diagnosis not present

## 2018-11-02 DIAGNOSIS — I10 Essential (primary) hypertension: Secondary | ICD-10-CM | POA: Diagnosis not present

## 2018-11-05 ENCOUNTER — Other Ambulatory Visit (HOSPITAL_BASED_OUTPATIENT_CLINIC_OR_DEPARTMENT_OTHER): Payer: Self-pay | Admitting: Family Medicine

## 2018-11-05 DIAGNOSIS — Z1231 Encounter for screening mammogram for malignant neoplasm of breast: Secondary | ICD-10-CM

## 2019-03-28 DIAGNOSIS — E785 Hyperlipidemia, unspecified: Secondary | ICD-10-CM | POA: Diagnosis not present

## 2019-03-28 DIAGNOSIS — Z5181 Encounter for therapeutic drug level monitoring: Secondary | ICD-10-CM | POA: Diagnosis not present

## 2019-03-28 DIAGNOSIS — Z794 Long term (current) use of insulin: Secondary | ICD-10-CM | POA: Diagnosis not present

## 2019-03-28 DIAGNOSIS — E119 Type 2 diabetes mellitus without complications: Secondary | ICD-10-CM | POA: Diagnosis not present

## 2019-05-03 DIAGNOSIS — F419 Anxiety disorder, unspecified: Secondary | ICD-10-CM | POA: Diagnosis not present

## 2019-05-03 DIAGNOSIS — R51 Headache: Secondary | ICD-10-CM | POA: Diagnosis not present

## 2019-05-03 DIAGNOSIS — G2581 Restless legs syndrome: Secondary | ICD-10-CM | POA: Diagnosis not present

## 2019-05-03 DIAGNOSIS — I10 Essential (primary) hypertension: Secondary | ICD-10-CM | POA: Diagnosis not present

## 2019-07-29 DIAGNOSIS — E785 Hyperlipidemia, unspecified: Secondary | ICD-10-CM | POA: Diagnosis not present

## 2019-07-29 DIAGNOSIS — K521 Toxic gastroenteritis and colitis: Secondary | ICD-10-CM | POA: Diagnosis not present

## 2019-07-29 DIAGNOSIS — E119 Type 2 diabetes mellitus without complications: Secondary | ICD-10-CM | POA: Diagnosis not present

## 2019-07-29 DIAGNOSIS — Z23 Encounter for immunization: Secondary | ICD-10-CM | POA: Diagnosis not present

## 2019-07-29 DIAGNOSIS — Z79899 Other long term (current) drug therapy: Secondary | ICD-10-CM | POA: Diagnosis not present

## 2019-07-29 DIAGNOSIS — Z794 Long term (current) use of insulin: Secondary | ICD-10-CM | POA: Diagnosis not present

## 2019-11-06 DIAGNOSIS — E785 Hyperlipidemia, unspecified: Secondary | ICD-10-CM | POA: Diagnosis not present

## 2019-11-06 DIAGNOSIS — G2581 Restless legs syndrome: Secondary | ICD-10-CM | POA: Diagnosis not present

## 2019-11-06 DIAGNOSIS — I10 Essential (primary) hypertension: Secondary | ICD-10-CM | POA: Diagnosis not present

## 2019-11-06 DIAGNOSIS — E119 Type 2 diabetes mellitus without complications: Secondary | ICD-10-CM | POA: Diagnosis not present

## 2019-11-06 DIAGNOSIS — G43909 Migraine, unspecified, not intractable, without status migrainosus: Secondary | ICD-10-CM | POA: Diagnosis not present

## 2019-11-06 DIAGNOSIS — N183 Chronic kidney disease, stage 3 unspecified: Secondary | ICD-10-CM | POA: Diagnosis not present

## 2019-11-06 DIAGNOSIS — F419 Anxiety disorder, unspecified: Secondary | ICD-10-CM | POA: Diagnosis not present

## 2019-11-06 DIAGNOSIS — G4700 Insomnia, unspecified: Secondary | ICD-10-CM | POA: Diagnosis not present

## 2019-11-06 DIAGNOSIS — Z794 Long term (current) use of insulin: Secondary | ICD-10-CM | POA: Diagnosis not present

## 2020-01-17 ENCOUNTER — Inpatient Hospital Stay (HOSPITAL_COMMUNITY): Payer: Medicare HMO

## 2020-01-17 ENCOUNTER — Emergency Department (HOSPITAL_COMMUNITY): Payer: Medicare HMO

## 2020-01-17 ENCOUNTER — Inpatient Hospital Stay (HOSPITAL_COMMUNITY)
Admission: EM | Admit: 2020-01-17 | Discharge: 2020-01-21 | DRG: 683 | Disposition: A | Discharge status: Home Health | Payer: Medicare HMO | Attending: Internal Medicine | Admitting: Internal Medicine

## 2020-01-17 ENCOUNTER — Encounter (HOSPITAL_COMMUNITY): Payer: Self-pay

## 2020-01-17 ENCOUNTER — Other Ambulatory Visit: Payer: Self-pay

## 2020-01-17 DIAGNOSIS — N170 Acute kidney failure with tubular necrosis: Secondary | ICD-10-CM | POA: Diagnosis not present

## 2020-01-17 DIAGNOSIS — Z825 Family history of asthma and other chronic lower respiratory diseases: Secondary | ICD-10-CM | POA: Diagnosis not present

## 2020-01-17 DIAGNOSIS — R778 Other specified abnormalities of plasma proteins: Secondary | ICD-10-CM | POA: Diagnosis present

## 2020-01-17 DIAGNOSIS — E118 Type 2 diabetes mellitus with unspecified complications: Secondary | ICD-10-CM | POA: Diagnosis present

## 2020-01-17 DIAGNOSIS — I10 Essential (primary) hypertension: Secondary | ICD-10-CM | POA: Diagnosis present

## 2020-01-17 DIAGNOSIS — E1165 Type 2 diabetes mellitus with hyperglycemia: Secondary | ICD-10-CM | POA: Diagnosis present

## 2020-01-17 DIAGNOSIS — R0902 Hypoxemia: Secondary | ICD-10-CM | POA: Diagnosis not present

## 2020-01-17 DIAGNOSIS — Z833 Family history of diabetes mellitus: Secondary | ICD-10-CM

## 2020-01-17 DIAGNOSIS — R6520 Severe sepsis without septic shock: Secondary | ICD-10-CM | POA: Diagnosis not present

## 2020-01-17 DIAGNOSIS — T383X5A Adverse effect of insulin and oral hypoglycemic [antidiabetic] drugs, initial encounter: Secondary | ICD-10-CM | POA: Diagnosis not present

## 2020-01-17 DIAGNOSIS — E875 Hyperkalemia: Secondary | ICD-10-CM | POA: Diagnosis not present

## 2020-01-17 DIAGNOSIS — E876 Hypokalemia: Secondary | ICD-10-CM | POA: Diagnosis present

## 2020-01-17 DIAGNOSIS — E871 Hypo-osmolality and hyponatremia: Secondary | ICD-10-CM | POA: Diagnosis not present

## 2020-01-17 DIAGNOSIS — Z915 Personal history of self-harm: Secondary | ICD-10-CM | POA: Diagnosis not present

## 2020-01-17 DIAGNOSIS — E860 Dehydration: Secondary | ICD-10-CM | POA: Diagnosis present

## 2020-01-17 DIAGNOSIS — Z20822 Contact with and (suspected) exposure to covid-19: Secondary | ICD-10-CM | POA: Diagnosis not present

## 2020-01-17 DIAGNOSIS — Z881 Allergy status to other antibiotic agents status: Secondary | ICD-10-CM

## 2020-01-17 DIAGNOSIS — R0689 Other abnormalities of breathing: Secondary | ICD-10-CM | POA: Diagnosis not present

## 2020-01-17 DIAGNOSIS — T528X1A Toxic effect of other organic solvents, accidental (unintentional), initial encounter: Secondary | ICD-10-CM | POA: Diagnosis present

## 2020-01-17 DIAGNOSIS — R05 Cough: Secondary | ICD-10-CM | POA: Diagnosis not present

## 2020-01-17 DIAGNOSIS — Y92009 Unspecified place in unspecified non-institutional (private) residence as the place of occurrence of the external cause: Secondary | ICD-10-CM

## 2020-01-17 DIAGNOSIS — E872 Acidosis, unspecified: Secondary | ICD-10-CM

## 2020-01-17 DIAGNOSIS — E1129 Type 2 diabetes mellitus with other diabetic kidney complication: Secondary | ICD-10-CM | POA: Diagnosis not present

## 2020-01-17 DIAGNOSIS — Z79899 Other long term (current) drug therapy: Secondary | ICD-10-CM | POA: Diagnosis not present

## 2020-01-17 DIAGNOSIS — N200 Calculus of kidney: Secondary | ICD-10-CM | POA: Diagnosis not present

## 2020-01-17 DIAGNOSIS — J90 Pleural effusion, not elsewhere classified: Secondary | ICD-10-CM | POA: Diagnosis not present

## 2020-01-17 DIAGNOSIS — Z794 Long term (current) use of insulin: Secondary | ICD-10-CM

## 2020-01-17 DIAGNOSIS — R6521 Severe sepsis with septic shock: Secondary | ICD-10-CM | POA: Diagnosis not present

## 2020-01-17 DIAGNOSIS — I959 Hypotension, unspecified: Secondary | ICD-10-CM | POA: Diagnosis not present

## 2020-01-17 DIAGNOSIS — R000 Tachycardia, unspecified: Secondary | ICD-10-CM | POA: Diagnosis not present

## 2020-01-17 DIAGNOSIS — F1010 Alcohol abuse, uncomplicated: Secondary | ICD-10-CM | POA: Diagnosis not present

## 2020-01-17 DIAGNOSIS — Z452 Encounter for adjustment and management of vascular access device: Secondary | ICD-10-CM | POA: Diagnosis not present

## 2020-01-17 DIAGNOSIS — Z85118 Personal history of other malignant neoplasm of bronchus and lung: Secondary | ICD-10-CM

## 2020-01-17 DIAGNOSIS — N179 Acute kidney failure, unspecified: Secondary | ICD-10-CM | POA: Diagnosis not present

## 2020-01-17 DIAGNOSIS — F419 Anxiety disorder, unspecified: Secondary | ICD-10-CM | POA: Diagnosis present

## 2020-01-17 DIAGNOSIS — T68XXXA Hypothermia, initial encounter: Secondary | ICD-10-CM | POA: Diagnosis present

## 2020-01-17 DIAGNOSIS — E8729 Other acidosis: Secondary | ICD-10-CM

## 2020-01-17 DIAGNOSIS — E785 Hyperlipidemia, unspecified: Secondary | ICD-10-CM | POA: Diagnosis present

## 2020-01-17 DIAGNOSIS — F10229 Alcohol dependence with intoxication, unspecified: Secondary | ICD-10-CM | POA: Diagnosis present

## 2020-01-17 DIAGNOSIS — Z8249 Family history of ischemic heart disease and other diseases of the circulatory system: Secondary | ICD-10-CM

## 2020-01-17 DIAGNOSIS — A419 Sepsis, unspecified organism: Secondary | ICD-10-CM | POA: Diagnosis not present

## 2020-01-17 DIAGNOSIS — Y907 Blood alcohol level of 200-239 mg/100 ml: Secondary | ICD-10-CM | POA: Diagnosis present

## 2020-01-17 DIAGNOSIS — R197 Diarrhea, unspecified: Secondary | ICD-10-CM | POA: Diagnosis not present

## 2020-01-17 DIAGNOSIS — R42 Dizziness and giddiness: Secondary | ICD-10-CM | POA: Diagnosis not present

## 2020-01-17 LAB — CBC WITH DIFFERENTIAL/PLATELET
Abs Immature Granulocytes: 0.74 10*3/uL — ABNORMAL HIGH (ref 0.00–0.07)
Basophils Absolute: 0.1 10*3/uL (ref 0.0–0.1)
Basophils Relative: 0 %
Eosinophils Absolute: 0 10*3/uL (ref 0.0–0.5)
Eosinophils Relative: 0 %
HCT: 51.3 % — ABNORMAL HIGH (ref 36.0–46.0)
Hemoglobin: 15.9 g/dL — ABNORMAL HIGH (ref 12.0–15.0)
Immature Granulocytes: 3 %
Lymphocytes Relative: 4 %
Lymphs Abs: 1.2 10*3/uL (ref 0.7–4.0)
MCH: 31.9 pg (ref 26.0–34.0)
MCHC: 31 g/dL (ref 30.0–36.0)
MCV: 103 fL — ABNORMAL HIGH (ref 80.0–100.0)
Monocytes Absolute: 1.8 10*3/uL — ABNORMAL HIGH (ref 0.1–1.0)
Monocytes Relative: 6 %
Neutro Abs: 23.9 10*3/uL — ABNORMAL HIGH (ref 1.7–7.7)
Neutrophils Relative %: 87 %
Platelets: 130 10*3/uL — ABNORMAL LOW (ref 150–400)
RBC: 4.98 MIL/uL (ref 3.87–5.11)
RDW: 13.2 % (ref 11.5–15.5)
WBC: 27.7 10*3/uL — ABNORMAL HIGH (ref 4.0–10.5)
nRBC: 0 % (ref 0.0–0.2)

## 2020-01-17 LAB — POCT I-STAT 7, (LYTES, BLD GAS, ICA,H+H)
Acid-base deficit: 15 mmol/L — ABNORMAL HIGH (ref 0.0–2.0)
Bicarbonate: 8.9 mmol/L — ABNORMAL LOW (ref 20.0–28.0)
Calcium, Ion: 0.97 mmol/L — ABNORMAL LOW (ref 1.15–1.40)
HCT: 44 % (ref 36.0–46.0)
Hemoglobin: 15 g/dL (ref 12.0–15.0)
O2 Saturation: 96 %
Patient temperature: 98.6
Potassium: 4.8 mmol/L (ref 3.5–5.1)
Sodium: 131 mmol/L — ABNORMAL LOW (ref 135–145)
TCO2: 9 mmol/L — ABNORMAL LOW (ref 22–32)
pCO2 arterial: 18 mmHg — CL (ref 32.0–48.0)
pH, Arterial: 7.302 — ABNORMAL LOW (ref 7.350–7.450)
pO2, Arterial: 86 mmHg (ref 83.0–108.0)

## 2020-01-17 LAB — URINALYSIS, ROUTINE W REFLEX MICROSCOPIC
Bilirubin Urine: NEGATIVE
Glucose, UA: 50 mg/dL — AB
Ketones, ur: NEGATIVE mg/dL
Leukocytes,Ua: NEGATIVE
Nitrite: NEGATIVE
Protein, ur: 30 mg/dL — AB
Specific Gravity, Urine: 1.023 (ref 1.005–1.030)
pH: 5 (ref 5.0–8.0)

## 2020-01-17 LAB — COMPREHENSIVE METABOLIC PANEL
ALT: 23 U/L (ref 0–44)
AST: 50 U/L — ABNORMAL HIGH (ref 15–41)
Albumin: 3.7 g/dL (ref 3.5–5.0)
Alkaline Phosphatase: 125 U/L (ref 38–126)
BUN: 52 mg/dL — ABNORMAL HIGH (ref 8–23)
CO2: <7 mmol/L — ABNORMAL LOW (ref 22–32)
Calcium: 7.4 mg/dL — ABNORMAL LOW (ref 8.9–10.3)
Chloride: 99 mmol/L (ref 98–111)
Creatinine, Ser: 3.13 mg/dL — ABNORMAL HIGH (ref 0.44–1.00)
GFR calc Af Amer: 17 mL/min — ABNORMAL LOW (ref >60)
GFR calc non Af Amer: 15 mL/min — ABNORMAL LOW (ref >60)
Glucose, Bld: 211 mg/dL — ABNORMAL HIGH (ref 70–99)
Potassium: 6.2 mmol/L — ABNORMAL HIGH (ref 3.5–5.1)
Sodium: 132 mmol/L — ABNORMAL LOW (ref 135–145)
Total Bilirubin: 0.5 mg/dL (ref 0.3–1.2)
Total Protein: 6.8 g/dL (ref 6.5–8.1)

## 2020-01-17 LAB — BASIC METABOLIC PANEL
Anion gap: 24 — ABNORMAL HIGH (ref 5–15)
BUN: 51 mg/dL — ABNORMAL HIGH (ref 8–23)
BUN: 49 mg/dL — ABNORMAL HIGH (ref 8–23)
CO2: <7 mmol/L — ABNORMAL LOW (ref 22–32)
CO2: <7 mmol/L — ABNORMAL LOW (ref 22–32)
Calcium: 6.1 mg/dL — CL (ref 8.9–10.3)
Calcium: 6.8 mg/dL — ABNORMAL LOW (ref 8.9–10.3)
Chloride: 109 mmol/L (ref 98–111)
Chloride: 105 mmol/L (ref 98–111)
Creatinine, Ser: 2.8 mg/dL — ABNORMAL HIGH (ref 0.44–1.00)
Creatinine, Ser: 2.64 mg/dL — ABNORMAL HIGH (ref 0.44–1.00)
GFR calc Af Amer: 20 mL/min — ABNORMAL LOW (ref >60)
GFR calc Af Amer: 21 mL/min — ABNORMAL LOW (ref >60)
GFR calc non Af Amer: 17 mL/min — ABNORMAL LOW (ref >60)
GFR calc non Af Amer: 18 mL/min — ABNORMAL LOW (ref >60)
Glucose, Bld: 258 mg/dL — ABNORMAL HIGH (ref 70–99)
Glucose, Bld: 259 mg/dL — ABNORMAL HIGH (ref 70–99)
Potassium: 4.9 mmol/L (ref 3.5–5.1)
Potassium: 5.5 mmol/L — ABNORMAL HIGH (ref 3.5–5.1)
Sodium: 134 mmol/L — ABNORMAL LOW (ref 135–145)
Sodium: 132 mmol/L — ABNORMAL LOW (ref 135–145)

## 2020-01-17 LAB — OSMOLALITY: Osmolality: 354 mOsm/kg (ref 275–295)

## 2020-01-17 LAB — SALICYLATE LEVEL: Salicylate Lvl: <7 mg/dL — ABNORMAL LOW (ref 7.0–30.0)

## 2020-01-17 LAB — CK TOTAL AND CKMB (NOT AT ARMC)
CK, MB: 96.1 ng/mL — ABNORMAL HIGH (ref 0.5–5.0)
Relative Index: 5.9 — ABNORMAL HIGH (ref 0.0–2.5)
Total CK: 1622 U/L — ABNORMAL HIGH (ref 38–234)

## 2020-01-17 LAB — MRSA PCR SCREENING: MRSA by PCR: NEGATIVE

## 2020-01-17 LAB — RESPIRATORY PANEL BY RT PCR (FLU A&B, COVID)
Influenza A by PCR: NEGATIVE
Influenza B by PCR: NEGATIVE
SARS Coronavirus 2 by RT PCR: NEGATIVE

## 2020-01-17 LAB — CREATININE, URINE, RANDOM: Creatinine, Urine: 74.81 mg/dL

## 2020-01-17 LAB — MAGNESIUM: Magnesium: 1.8 mg/dL (ref 1.7–2.4)

## 2020-01-17 LAB — BLOOD GAS, ARTERIAL
Acid-base deficit: 26.3 mmol/L — ABNORMAL HIGH (ref 0.0–2.0)
Bicarbonate: 4.5 mmol/L — ABNORMAL LOW (ref 20.0–28.0)
FIO2: 21
O2 Saturation: 96.6 %
Patient temperature: 91.7
pCO2 arterial: 15.9 mmHg — CL (ref 32.0–48.0)
pH, Arterial: 7.048 — CL (ref 7.350–7.450)
pO2, Arterial: 107 mmHg (ref 83.0–108.0)

## 2020-01-17 LAB — LACTIC ACID, PLASMA
Lactic Acid, Venous: 5.5 mmol/L (ref 0.5–1.9)
Lactic Acid, Venous: 4.4 mmol/L (ref 0.5–1.9)
Lactic Acid, Venous: 4.1 mmol/L (ref 0.5–1.9)

## 2020-01-17 LAB — PROTIME-INR
INR: 1.7 — ABNORMAL HIGH (ref 0.8–1.2)
Prothrombin Time: 20.3 seconds — ABNORMAL HIGH (ref 11.4–15.2)

## 2020-01-17 LAB — LIPASE, BLOOD: Lipase: 66 U/L — ABNORMAL HIGH (ref 11–51)

## 2020-01-17 LAB — GLUCOSE, CAPILLARY
Glucose-Capillary: 274 mg/dL — ABNORMAL HIGH (ref 70–99)
Glucose-Capillary: 303 mg/dL — ABNORMAL HIGH (ref 70–99)

## 2020-01-17 LAB — ACETAMINOPHEN LEVEL: Acetaminophen (Tylenol), Serum: <10 ug/mL — ABNORMAL LOW (ref 10–30)

## 2020-01-17 LAB — PROCALCITONIN: Procalcitonin: 1.31 ng/mL

## 2020-01-17 LAB — HIV ANTIBODY (ROUTINE TESTING W REFLEX): HIV Screen 4th Generation wRfx: NONREACTIVE

## 2020-01-17 LAB — SODIUM, URINE, RANDOM: Sodium, Ur: 39 mmol/L

## 2020-01-17 LAB — ETHANOL: Alcohol, Ethyl (B): 206 mg/dL — ABNORMAL HIGH (ref <10)

## 2020-01-17 LAB — APTT: aPTT: 30 seconds (ref 24–36)

## 2020-01-17 LAB — TROPONIN I (HIGH SENSITIVITY)
Troponin I (High Sensitivity): 155 ng/L (ref <18)
Troponin I (High Sensitivity): 177 ng/L (ref <18)

## 2020-01-17 LAB — BETA-HYDROXYBUTYRIC ACID: Beta-Hydroxybutyric Acid: 2.74 mmol/L — ABNORMAL HIGH (ref 0.05–0.27)

## 2020-01-17 LAB — STREP PNEUMONIAE URINARY ANTIGEN: Strep Pneumo Urinary Antigen: NEGATIVE

## 2020-01-17 LAB — BRAIN NATRIURETIC PEPTIDE: B Natriuretic Peptide: 748.7 pg/mL — ABNORMAL HIGH (ref 0.0–100.0)

## 2020-01-17 MED ORDER — ONDANSETRON HCL 4 MG/2ML IJ SOLN
4.0000 mg | Freq: Once | INTRAMUSCULAR | Status: AC
  Administered 2020-01-17: 13:00:00 4 mg via INTRAVENOUS
  Filled 2020-01-17: qty 2

## 2020-01-17 MED ORDER — PANTOPRAZOLE SODIUM 40 MG IV SOLR
40.0000 mg | Freq: Every day | INTRAVENOUS | Status: DC
  Administered 2020-01-17 – 2020-01-20 (×4): 40 mg via INTRAVENOUS
  Filled 2020-01-17 (×4): qty 40

## 2020-01-17 MED ORDER — LACTATED RINGERS IV BOLUS
1000.0000 mL | Freq: Once | INTRAVENOUS | Status: AC
  Administered 2020-01-17: 1000 mL via INTRAVENOUS

## 2020-01-17 MED ORDER — SODIUM CHLORIDE 0.9 % IV SOLN
INTRAVENOUS | Status: DC | PRN
  Administered 2020-01-17: 1000 mL via INTRAVENOUS

## 2020-01-17 MED ORDER — DEXTROSE 50 % IV SOLN
50.0000 mL | Freq: Once | INTRAVENOUS | Status: AC
  Administered 2020-01-17: 50 mL via INTRAVENOUS
  Filled 2020-01-17: qty 50

## 2020-01-17 MED ORDER — VANCOMYCIN HCL IN DEXTROSE 1-5 GM/200ML-% IV SOLN
1000.0000 mg | Freq: Once | INTRAVENOUS | Status: AC
  Administered 2020-01-17: 1000 mg via INTRAVENOUS
  Filled 2020-01-17: qty 200

## 2020-01-17 MED ORDER — DOCUSATE SODIUM 100 MG PO CAPS: 100.0000 mg | ORAL_CAPSULE | Freq: Two times a day (BID) | ORAL | Status: DC | PRN

## 2020-01-17 MED ORDER — VANCOMYCIN VARIABLE DOSE PER UNSTABLE RENAL FUNCTION (PHARMACIST DOSING): Status: DC

## 2020-01-17 MED ORDER — SODIUM CHLORIDE 0.9 % IV BOLUS (SEPSIS)
1000.0000 mL | Freq: Once | INTRAVENOUS | Status: AC
  Administered 2020-01-17: 1000 mL via INTRAVENOUS

## 2020-01-17 MED ORDER — PIPERACILLIN-TAZOBACTAM IN DEX 2-0.25 GM/50ML IV SOLN
2.2500 g | Freq: Four times a day (QID) | INTRAVENOUS | Status: DC
  Administered 2020-01-17 – 2020-01-18 (×2): 2.25 g via INTRAVENOUS
  Filled 2020-01-17 (×6): qty 50

## 2020-01-17 MED ORDER — ALBUTEROL SULFATE (2.5 MG/3ML) 0.083% IN NEBU: 2.5000 mg | INHALATION_SOLUTION | RESPIRATORY_TRACT | Status: DC | PRN

## 2020-01-17 MED ORDER — POLYETHYLENE GLYCOL 3350 17 G PO PACK: 17.0000 g | PACK | Freq: Every day | ORAL | Status: DC | PRN

## 2020-01-17 MED ORDER — MORPHINE SULFATE (PF) 4 MG/ML IV SOLN
4.0000 mg | Freq: Once | INTRAVENOUS | Status: AC
  Administered 2020-01-17: 4 mg via INTRAVENOUS
  Filled 2020-01-17: qty 1

## 2020-01-17 MED ORDER — PIPERACILLIN-TAZOBACTAM 3.375 G IVPB 30 MIN
3.3750 g | Freq: Once | INTRAVENOUS | Status: AC
  Administered 2020-01-17: 3.375 g via INTRAVENOUS

## 2020-01-17 MED ORDER — MORPHINE SULFATE (PF) 2 MG/ML IV SOLN
1.0000 mg | INTRAVENOUS | Status: DC | PRN
  Administered 2020-01-17 – 2020-01-18 (×2): 1 mg via INTRAVENOUS
  Filled 2020-01-17 (×2): qty 1

## 2020-01-17 MED ORDER — INSULIN ASPART 100 UNIT/ML IV SOLN
10.0000 [IU] | Freq: Once | INTRAVENOUS | Status: AC
  Administered 2020-01-17: 10 [IU] via INTRAVENOUS
  Filled 2020-01-17: qty 0.1

## 2020-01-17 MED ORDER — CALCIUM GLUCONATE-NACL 1-0.675 GM/50ML-% IV SOLN
1.0000 g | Freq: Once | INTRAVENOUS | Status: AC
  Administered 2020-01-17: 1000 mg via INTRAVENOUS
  Filled 2020-01-17: qty 50

## 2020-01-17 MED ORDER — SODIUM CHLORIDE 0.9 % IV BOLUS (SEPSIS)
500.0000 mL | Freq: Once | INTRAVENOUS | Status: AC
  Administered 2020-01-17: 15:00:00 500 mL via INTRAVENOUS

## 2020-01-17 MED ORDER — HEPARIN SODIUM (PORCINE) 5000 UNIT/ML IJ SOLN
5000.0000 [IU] | Freq: Three times a day (TID) | INTRAMUSCULAR | Status: DC
  Administered 2020-01-17 – 2020-01-21 (×11): 5000 [IU] via SUBCUTANEOUS
  Filled 2020-01-17 (×11): qty 1

## 2020-01-17 MED ORDER — SODIUM CHLORIDE 0.9 % IV SOLN
15.0000 mg/kg | Freq: Once | INTRAVENOUS | Status: AC
  Administered 2020-01-17: 1120 mg via INTRAVENOUS
  Filled 2020-01-17: qty 1.12

## 2020-01-17 MED ORDER — HEPARIN SODIUM (PORCINE) 1000 UNIT/ML IJ SOLN
1000.0000 [IU] | INTRAMUSCULAR | Status: DC | PRN
  Administered 2020-01-17: 1000 [IU] via INTRAVENOUS
  Filled 2020-01-17: qty 1

## 2020-01-17 MED ORDER — PIPERACILLIN-TAZOBACTAM 3.375 G IVPB
3.3750 g | Freq: Once | INTRAVENOUS | Status: DC
  Administered 2020-01-17: 3.375 g via INTRAVENOUS
  Filled 2020-01-17: qty 50

## 2020-01-17 MED ORDER — SODIUM BICARBONATE-DEXTROSE 150-5 MEQ/L-% IV SOLN
150.0000 meq | INTRAVENOUS | Status: DC
  Administered 2020-01-17 – 2020-01-18 (×3): 150 meq via INTRAVENOUS
  Filled 2020-01-17 (×4): qty 1000

## 2020-01-17 MED ORDER — INSULIN ASPART 100 UNIT/ML ~~LOC~~ SOLN
0.0000 [IU] | SUBCUTANEOUS | Status: DC
  Administered 2020-01-17: 16 [IU] via SUBCUTANEOUS
  Administered 2020-01-18: 8 [IU] via SUBCUTANEOUS
  Administered 2020-01-18 (×4): 12 [IU] via SUBCUTANEOUS
  Administered 2020-01-19: 2 [IU] via SUBCUTANEOUS
  Administered 2020-01-19: 8 [IU] via SUBCUTANEOUS
  Administered 2020-01-19: 4 [IU] via SUBCUTANEOUS
  Administered 2020-01-19: 8 [IU] via SUBCUTANEOUS
  Administered 2020-01-19: 4 [IU] via SUBCUTANEOUS
  Administered 2020-01-20: 2 [IU] via SUBCUTANEOUS
  Administered 2020-01-20: 4 [IU] via SUBCUTANEOUS

## 2020-01-17 MED ORDER — SODIUM CHLORIDE 0.9 % IV BOLUS
1000.0000 mL | Freq: Once | INTRAVENOUS | Status: AC
  Administered 2020-01-17: 1000 mL via INTRAVENOUS

## 2020-01-17 MED ORDER — ONDANSETRON HCL 4 MG/2ML IJ SOLN
4.0000 mg | Freq: Four times a day (QID) | INTRAMUSCULAR | Status: DC | PRN
  Administered 2020-01-19 (×2): 4 mg via INTRAVENOUS
  Filled 2020-01-17 (×3): qty 2

## 2020-01-17 NOTE — Progress Notes (Signed)
Pharmacy Antibiotic Note  CLISTA MERCY is a 64 y.o. female admitted on 01/17/2020 with past medical history of diabetes, hypertension, renal insufficiency.  She presents today for evaluation of nausea, headache, and chills.  She reports a 1 week history of cough preceding these symptoms.  Pharmacy has been consulted for vancomycin and zosyn dosing for sepsis.  Scr 3.13 (CrCl ~ 82mls/min)   Plan: Vancomycin 1gm x1 in ED then will obtain random vanc level on 4/25 Zosyn 3.375gm x 1 in ED then 2.25gm Q6h Daily Scr while on vanc and zosyn Follow renal function, cultures and clinical course  Height: 5\' 8"  (172.7 cm) Weight: 74.8 kg (165 lb) IBW/kg (Calculated) : 63.9  Temp (24hrs), Avg:91.7 F (33.2 C), Min:91.7 F (33.2 C), Max:91.7 F (33.2 C)  Recent Labs  Lab 01/17/20 1221 01/17/20 1415  WBC 27.7*  --   CREATININE 3.13*  --   LATICACIDVEN  --  5.5*    Estimated Creatinine Clearance: 18.1 mL/min (A) (by C-G formula based on SCr of 3.13 mg/dL (H)).    Allergies  Allergen Reactions  . Ciprofloxacin Diarrhea    Antimicrobials this admission: 4/23 vanc >> 4/23 zosyn >> Dose adjustments this admission:   Microbiology results: 4/23 BCx:  4/23 UCx:  4/23 Sputum:    Thank you for allowing pharmacy to be a part of this patient's care.  Dolly Rias RPh 01/17/2020, 3:22 PM

## 2020-01-17 NOTE — ED Notes (Signed)
Date and time results received: 01/17/20 3:23 PM  (use smartphrase ".now" to insert current time)  Test: Troponin Critical Value: 177  Name of Provider Notified: Carley RN  Orders Received? Or Actions Taken?: Carley RN primary RN made aware

## 2020-01-17 NOTE — ED Notes (Signed)
US at bedside

## 2020-01-17 NOTE — ED Notes (Signed)
MD made aware of rectal temp. Bear hugger applied at this time.

## 2020-01-17 NOTE — ED Triage Notes (Signed)
Pt BIB EMS from home. Pt reports cough x1 week but started experiencing nausea and vomiting today. Family informed EMS of ETOH hx, but pt denies recent ETOH intake. Pt hypotensive with EMS.  20G LAC

## 2020-01-17 NOTE — Procedures (Signed)
Hemodialysis Catheter Insertion Procedure Note [name] [id number] [birth year]  Procedure: Insertion of Central Venous Catheter Indications: Ingestion, emergent hemodialysis  Procedure Details Consent: Risks of procedure as well as the alternatives and risks of each were explained to the (patient/caregiver).  Consent for procedure obtained. Time Out: Verified patient identification, verified procedure, site/side was marked, verified correct patient position, special equipment/implants available, medications/allergies/relevent history reviewed, required imaging and test results available.  Performed  Maximum sterile technique was used including antiseptics, gloves, gown, hand hygiene, mask and sheet. Skin prep: Chlorhexidine; local anesthetic administered A antimicrobial bonded/coated triple lumen catheter was placed in the right internal jugular vein using the Seldinger technique.  Ultrasound was used to verify the patency of the vein and for real time needle guidance.  Evaluation Blood flow good Complications: No apparent complications Patient did tolerate procedure well. Chest X-ray ordered to verify placement.  CXR: pending.  Roselie Awkward 01/17/2020, 6:47 PM

## 2020-01-17 NOTE — ED Notes (Signed)
ED TO INPATIENT HANDOFF REPORT  Name/Age/Gender [name] 64 y.o. female  Code Status    Code Status Orders  (From admission, onward)         Start     Ordered   01/17/20 1504  Full code  Continuous     01/17/20 1508        Code Status History    Date Active Date Inactive Code Status Order ID Comments User Context   04/13/2012 1615 04/16/2012 1819 Full Code [id number]  Noelle Penner, RN Inpatient   04/11/2012 1804 04/13/2012 1615 Full Code [id number]  Augustin Schooling, MD Inpatient   Advance Care Planning Activity    Advance Directive Documentation     Most Recent Value  Type of Advance Directive  Living will  Pre-existing out of facility DNR order (yellow form or pink MOST form)  --  "MOST" Form in Place?  --      Home/SNF/Other Home  Chief Complaint Sepsis ([place]) [A41.9] Ethylene glycol poisoning [T52.8X1A]  Level of Care/Admitting Diagnosis ED Disposition    ED Disposition Condition [place]: [place] [100100]  Level of Care: ICU [6]  Covid Evaluation: Confirmed COVID Negative  Diagnosis: Ethylene glycol poisoning [id number]  Admitting Physician: [place], [place]  Attending Physician: Juanito Doom [[id number]]  Estimated length of stay: past midnight tomorrow  Certification:: I certify this patient will need inpatient services for at least 2 midnights       Medical History Past Medical History:  Diagnosis Date  . Alcohol abuse   . Anxiety   . Cancer ([place])    lung  . Diabetes mellitus   . Headache(784.0)   . Hypertension   . Pancreatitis, acute     Allergies Allergies  Allergen Reactions  . Ciprofloxacin Diarrhea    IV Location/Drains/Wounds Patient Lines/Drains/Airways Status   Active Line/Drains/Airways    Name:   Placement date:   Placement time:   Site:   Days:   Peripheral IV 01/17/20 Left Antecubital   01/17/20    1227    Antecubital   less than 1   Peripheral IV 01/17/20  Right Antecubital   01/17/20    1425    Antecubital   less than 1   Incision 04/13/12 Shoulder Left   04/13/12    1418     2835          Labs/Imaging Results for orders placed or performed during the hospital encounter of 01/17/20 (from the past 48 hour(s))  Comprehensive metabolic panel     Status: Abnormal   Collection Time: 01/17/20 12:21 PM  Result Value Ref Range   Sodium 132 (L) 135 - 145 mmol/L   Potassium 6.2 (H) 3.5 - 5.1 mmol/L    Comment: NO VISIBLE HEMOLYSIS   Chloride 99 98 - 111 mmol/L   CO2 <7 (L) 22 - 32 mmol/L   Glucose, Bld 211 (H) 70 - 99 mg/dL    Comment: Glucose reference range applies only to samples taken after fasting for at least 8 hours.   BUN 52 (H) 8 - 23 mg/dL   Creatinine, Ser 3.13 (H) 0.44 - 1.00 mg/dL   Calcium 7.4 (L) 8.9 - 10.3 mg/dL   Total Protein 6.8 6.5 - 8.1 g/dL   Albumin 3.7 3.5 - 5.0 g/dL   AST 50 (H) 15 - 41 U/L   ALT 23 0 - 44 U/L   Alkaline Phosphatase 125 38 -  126 U/L   Total Bilirubin 0.5 0.3 - 1.2 mg/dL   GFR calc non Af Amer 15 (L) >60 mL/min   GFR calc Af Amer 17 (L) >60 mL/min   Anion gap NOT CALCULATED 5 - 15    Comment: Performed at [facility], [place] [address], [place], [place] [address]  CBC with Differential     Status: Abnormal   Collection Time: 01/17/20 12:21 PM  Result Value Ref Range   WBC 27.7 (H) 4.0 - 10.5 K/uL   RBC 4.98 3.87 - 5.11 MIL/uL   Hemoglobin 15.9 (H) 12.0 - 15.0 g/dL   HCT 51.3 (H) 36.0 - 46.0 %   MCV 103.0 (H) 80.0 - 100.0 fL   MCH 31.9 26.0 - 34.0 pg   MCHC 31.0 30.0 - 36.0 g/dL   RDW 13.2 11.5 - 15.5 %   Platelets 130 (L) 150 - 400 K/uL   nRBC 0.0 0.0 - 0.2 %   Neutrophils Relative % 87 %   Neutro Abs 23.9 (H) 1.7 - 7.7 K/uL   Lymphocytes Relative 4 %   Lymphs Abs 1.2 0.7 - 4.0 K/uL   Monocytes Relative 6 %   Monocytes Absolute 1.8 (H) 0.1 - 1.0 K/uL   Eosinophils Relative 0 %   Eosinophils Absolute 0.0 0.0 - 0.5 K/uL   Basophils Relative 0 %   Basophils Absolute 0.1  0.0 - 0.1 K/uL   WBC Morphology MILD LEFT SHIFT (1-5% METAS, OCC MYELO, OCC BANDS)    Immature Granulocytes 3 %   Abs Immature Granulocytes 0.74 (H) 0.00 - 0.07 K/uL    Comment: Performed at [facility], [place] [address], [place], [place] [address]  Lipase, blood     Status: Abnormal   Collection Time: 01/17/20 12:21 PM  Result Value Ref Range   Lipase 66 (H) 11 - 51 U/L    Comment: Performed at [facility], [place] [address] [place], [place] [address]  Troponin I (High Sensitivity)     Status: Abnormal   Collection Time: 01/17/20 12:21 PM  Result Value Ref Range   Troponin I (High Sensitivity) 155 (HH) <18 ng/L    Comment: CRITICAL RESULT CALLED TO, READ BACK BY AND VERIFIED WITH: GARRISON,G. RN AT 1342 01/17/20 MULLINS,T (NOTE) Elevated high sensitivity troponin I (hsTnI) values and significant  changes across serial measurements may suggest ACS but many other  chronic and acute conditions are known to elevate hsTnI results.  Refer to the Links section for chest pain algorithms and additional  guidance. Performed at [facility], [place] [address] [place], [place] [address]   Lactic acid, plasma     Status: Abnormal   Collection Time: 01/17/20  2:15 PM  Result Value Ref Range   Lactic Acid, Venous 5.5 (HH) 0.5 - 1.9 mmol/L    Comment: CRITICAL RESULT CALLED TO, READ BACK BY AND VERIFIED WITH: Kathryne Sharper 1504 01/17/20 JM Performed at [facility], [place] [address] [place], [place] [address]   APTT     Status: None   Collection Time: 01/17/20  2:15 PM  Result Value Ref Range   aPTT 30 24 - 36 seconds    Comment: Performed at [facility], [place] [address] [place],  [address]  Protime-INR     Status: Abnormal   Collection Time: 01/17/20  2:15 PM  Result Value Ref Range   Prothrombin Time 20.3 (H) 11.4 - 15.2 seconds   INR 1.7 (  H) 0.8 - 1.2    Comment: (NOTE) INR goal varies based  on device and disease states. Performed at [facility], [place] [address], [place], [place] [address]   Troponin I (High Sensitivity)     Status: Abnormal   Collection Time: 01/17/20  2:21 PM  Result Value Ref Range   Troponin I (High Sensitivity) 177 (HH) <18 ng/L    Comment: CRITICAL RESULT CALLED TO, READ BACK BY AND VERIFIED WITH: Samul Dada RN 1522 01/17/20 JM (NOTE) Elevated high sensitivity troponin I (hsTnI) values and significant  changes across serial measurements may suggest ACS but many other  chronic and acute conditions are known to elevate hsTnI results.  Refer to the Links section for chest pain algorithms and additional  guidance. Performed at [facility], [place] [address], [place], [place] [address]   Respiratory Panel by RT PCR (Flu A&B, Covid) - Nasopharyngeal Swab     Status: None   Collection Time: 01/17/20  2:47 PM   Specimen: Nasopharyngeal Swab  Result Value Ref Range   SARS Coronavirus 2 by RT PCR NEGATIVE NEGATIVE    Comment: (NOTE) SARS-CoV-2 target nucleic acids are NOT DETECTED. The SARS-CoV-2 RNA is generally detectable in upper respiratoy specimens during the acute phase of infection. The lowest concentration of SARS-CoV-2 viral copies this assay can detect is 131 copies/mL. A negative result does not preclude SARS-Cov-2 infection and should not be used as the sole basis for treatment or other patient management decisions. A negative result may occur with  improper specimen collection/handling, submission of specimen other than nasopharyngeal swab, presence of viral mutation(s) within the areas targeted by this assay, and inadequate number of viral copies (<131 copies/mL). A negative result must be combined with clinical observations, patient history, and epidemiological information. The expected result is Negative. Fact Sheet for Patients:  PinkCheek.be Fact Sheet for Healthcare  Providers:  GravelBags.it This test is not yet ap proved or cleared by the [place] FDA and  has been authorized for detection and/or diagnosis of SARS-CoV-2 by FDA under an Emergency Use Authorization (EUA). This EUA will remain  in effect (meaning this test can be used) for the duration of the COVID-19 declaration under Section 564(b)(1) of the Act, 21 U.S.C. section 360bbb-3(b)(1), unless the authorization is terminated or revoked sooner.    Influenza A by PCR NEGATIVE NEGATIVE   Influenza B by PCR NEGATIVE NEGATIVE    Comment: (NOTE) The Xpert Xpress SARS-CoV-2/FLU/RSV assay is intended as an aid in  the diagnosis of influenza from Nasopharyngeal swab specimens and  should not be used as a sole basis for treatment. Nasal washings and  aspirates are unacceptable for Xpert Xpress SARS-CoV-2/FLU/RSV  testing. Fact Sheet for Patients: PinkCheek.be Fact Sheet for Healthcare Providers: GravelBags.it This test is not yet approved or cleared by the [place] FDA and  has been authorized for detection and/or diagnosis of SARS-CoV-2 by  FDA under an Emergency Use Authorization (EUA). This EUA will remain  in effect (meaning this test can be used) for the duration of the  Covid-19 declaration under Section 564(b)(1) of the Act, 21  U.S.C. section 360bbb-3(b)(1), unless the authorization is  terminated or revoked. Performed at [facility], [place] [address] [place], [place] [address]   Blood gas, arterial     Status: Abnormal   Collection Time: 01/17/20  3:00 PM  Result Value Ref Range   FIO2 21.00    pH, Arterial 7.048 (LL) 7.350 -  7.450    Comment: CRITICAL RESULT CALLED TO, READ BACK BY AND VERIFIED WITH: Kathryne Sharper RN 1514 01/17/20 JM    pCO2 arterial 15.9 (LL) 32.0 - 48.0 mmHg    Comment: CRITICAL RESULT CALLED TO, READ BACK BY AND VERIFIED WITH: Kathryne Sharper RN [id number]  01/17/20 JM    pO2, Arterial 107 83.0 - 108.0 mmHg   Bicarbonate 4.5 (L) 20.0 - 28.0 mmol/L   Acid-base deficit 26.3 (H) 0.0 - 2.0 mmol/L   O2 Saturation 96.6 %   Patient temperature 91.7    Allens test (pass/fail) PASS PASS    Comment: Performed at [facility], [place] [address] [place], [place] [address]  Osmolality     Status: Abnormal   Collection Time: 01/17/20  3:40 PM  Result Value Ref Range   Osmolality 354 (HH) 275 - 295 mOsm/kg    Comment: REPEATED TO VERIFY CRITICAL RESULT CALLED TO, READ BACK BY AND VERIFIED WITH: Ninetta Lights RN [id number] K FORSYTH Performed at [facility], [address], [place], [place] [address]   Acetaminophen level     Status: Abnormal   Collection Time: 01/17/20  4:00 PM  Result Value Ref Range   Acetaminophen (Tylenol), Serum <10 (L) 10 - 30 ug/mL    Comment: (NOTE) Therapeutic concentrations vary significantly. A range of 10-30 ug/mL  may be an effective concentration for many patients. However, some  are best treated at concentrations outside of this range. Acetaminophen concentrations >150 ug/mL at 4 hours after ingestion  and >50 ug/mL at 12 hours after ingestion are often associated with  toxic reactions. Performed at [facility], [place] [address], [place], [place] [id number]   Salicylate level     Status: Abnormal   Collection Time: 01/17/20  4:00 PM  Result Value Ref Range   Salicylate Lvl [id number] (L) 7.0 - 30.0 mg/dL    Comment: Performed at [facility], [place] [address], [place], [place] [address]  Ethanol     Status: Abnormal   Collection Time: 01/17/20  4:00 PM  Result Value Ref Range   Alcohol, Ethyl (B) 206 (H) <10 mg/dL    Comment: (NOTE) Lowest detectable limit for serum alcohol is 10 mg/dL. For medical purposes only. Performed at [facility], [place] [address], [place], [place] [address]   Lactic acid, plasma     Status: Abnormal   Collection  Time: 01/17/20  4:00 PM  Result Value Ref Range   Lactic Acid, Venous 4.4 (HH) 0.5 - 1.9 mmol/L    Comment: CRITICAL VALUE NOTED.  VALUE IS CONSISTENT WITH PREVIOUSLY REPORTED AND CALLED VALUE. Performed at [facility], [place] [address], [place],  [address]   Procalcitonin     Status: None   Collection Time: 01/17/20  4:00 PM  Result Value Ref Range   Procalcitonin 1.31 ng/mL    Comment:        Interpretation: PCT > 0.5 ng/mL and <= 2 ng/mL: Systemic infection (sepsis) is possible, but other conditions are known to elevate PCT as well. (NOTE)       Sepsis PCT Algorithm           Lower Respiratory Tract                                      Infection PCT Algorithm    ----------------------------     ----------------------------  PCT < 0.25 ng/mL                PCT < 0.10 ng/mL         Strongly encourage             Strongly discourage   discontinuation of antibiotics    initiation of antibiotics    ----------------------------     -----------------------------       PCT 0.25 - 0.50 ng/mL            PCT 0.10 - 0.25 ng/mL               OR       >80% decrease in PCT            Discourage initiation of                                            antibiotics      Encourage discontinuation           of antibiotics    ----------------------------     -----------------------------         PCT >= 0.50 ng/mL              PCT 0.26 - 0.50 ng/mL                AND       <80% decrease in PCT             Encourage initiation of                                             antibiotics       Encourage continuation           of antibiotics    ----------------------------     -----------------------------        PCT >= 0.50 ng/mL                  PCT > 0.50 ng/mL               AND         increase in PCT                  Strongly encourage                                      initiation of antibiotics    Strongly encourage escalation           of antibiotics                                      -----------------------------                                           PCT <= 0.25 ng/mL  OR                                        > 80% decrease in PCT                                     Discontinue / Do not initiate                                             antibiotics Performed at [place] [address] [place], [place] [address]   Brain natriuretic peptide     Status: Abnormal   Collection Time: 01/17/20  4:00 PM  Result Value Ref Range   B Natriuretic Peptide 748.7 (H) 0.0 - 100.0 pg/mL    Comment: Performed at [facility], [place] [address] [place], [place] [id number]  Basic metabolic panel     Status: Abnormal   Collection Time: 01/17/20  4:00 PM  Result Value Ref Range   Sodium 134 (L) 135 - 145 mmol/L   Potassium 4.9 3.5 - 5.1 mmol/L    Comment: DELTA CHECK NOTED   Chloride 109 98 - 111 mmol/L   CO2 <7 (L) 22 - 32 mmol/L   Glucose, Bld 258 (H) 70 - 99 mg/dL    Comment: Glucose reference range applies only to samples taken after fasting for at least 8 hours.   BUN 51 (H) 8 - 23 mg/dL   Creatinine, Ser 2.80 (H) 0.44 - 1.00 mg/dL   Calcium 6.1 (LL) 8.9 - 10.3 mg/dL    Comment: CRITICAL RESULT CALLED TO, READ BACK BY AND VERIFIED WITH: [facility] RN @1646  ON 01/17/20 JM    GFR calc non Af Amer 17 (L) >60 mL/min   GFR calc Af Amer 20 (L) >60 mL/min   Anion gap NOT CALCULATED 5 - 15    Comment: Performed at [facility], [place] [address] [place], [place] [address]  CK total and CKMB (cardiac)not at [facility]     Status: Abnormal   Collection Time: 01/17/20  4:00 PM  Result Value Ref Range   Total CK 1,622 (H) 38 - 234 U/L   CK, MB 96.1 (H) 0.5 - 5.0 ng/mL   Relative Index 5.9 (H) 0.0 - 2.5    Comment: Performed at [place] [address], [place], [place] [address]  Magnesium     Status: None   Collection Time: 01/17/20  4:00  PM  Result Value Ref Range   Magnesium 1.8 1.7 - 2.4 mg/dL    Comment: Performed at [facility], [place] [address], [place], [place] [address]  Urinalysis, Routine w reflex microscopic     Status: Abnormal   Collection Time: 01/17/20  5:28 PM  Result Value Ref Range   Color, Urine YELLOW YELLOW   APPearance CLOUDY (A) CLEAR   Specific Gravity, Urine 1.023 1.005 - 1.030   pH 5.0 5.0 - 8.0   Glucose, UA 50 (A) NEGATIVE mg/dL   Hgb urine dipstick LARGE (A) NEGATIVE   Bilirubin Urine NEGATIVE NEGATIVE   Ketones, ur NEGATIVE NEGATIVE mg/dL   Protein, ur 30 (A) NEGATIVE mg/dL   Nitrite NEGATIVE NEGATIVE  Leukocytes,Ua NEGATIVE NEGATIVE   RBC / HPF 6-10 0 - 5 RBC/hpf   WBC, UA 6-10 0 - 5 WBC/hpf   Bacteria, UA MANY (A) NONE SEEN   Squamous Epithelial / LPF 0-5 0 - 5    Comment: Performed at [facility], [place] [address], [place], [place] [address]  Sodium, urine, random     Status: None   Collection Time: 01/17/20  5:28 PM  Result Value Ref Range   Sodium, Ur 39 mmol/L    Comment: Performed at [facility], [place] [address] [place], [place] [address]  Creatinine, urine, random     Status: None   Collection Time: 01/17/20  5:28 PM  Result Value Ref Range   Creatinine, Urine 74.81 mg/dL    Comment: Performed at [facility], [place] [address] [place], [place] [address]  Lactic acid, plasma     Status: Abnormal   Collection Time: 01/17/20  5:40 PM  Result Value Ref Range   Lactic Acid, Venous 4.1 (HH) 0.5 - 1.9 mmol/L    Comment: CRITICAL VALUE NOTED.  VALUE IS CONSISTENT WITH PREVIOUSLY REPORTED AND CALLED VALUE. Performed at [facility] [place] [address], [place], [place] [address]    US RENAL  Result Date: 01/17/2020 CLINICAL DATA:  Acute kidney injury with ATN, sepsis EXAM: RENAL / URINARY TRACT ULTRASOUND COMPLETE COMPARISON:  Ultrasound 06/08/2005, CT 06/08/2005 FINDINGS: Right Kidney:  Renal measurements: 10.1 x 4.8 x 5.6 cm = volume: 142 mL . Echogenicity within normal limits. No mass or hydronephrosis visualized. Difficult visualization of the right kidney secondary to patient body habitus and bowel gas. Left Kidney: Renal measurements: 10.9 x 6.3 x 5.4 cm = volume: 194 mL. Few echogenic shadowing calculi are present in the upper and interpolar left kidney, largest measuring up to 7 mm in diameter. No hydronephrosis. No concerning renal mass. Bladder: Appears normal for degree of bladder distention. Other: None. IMPRESSION: Few nonobstructing nephroliths in the upper pole and interpolar left kidney. No hydronephrosis. Grossly unremarkable appearance of the right kidney, limited visualization due to body habitus and bowel gas. Bladder is unremarkable. Electronically Signed   By: Lovena Le M.D.   On: 01/17/2020 17:22   DG Chest Portable 1 View  Result Date: 01/17/2020 CLINICAL DATA:  Hemodialysis catheter placement EXAM: PORTABLE CHEST 1 VIEW COMPARISON:  01/17/2020 at 1:07 p.m. FINDINGS: Single frontal view of the chest is obtained, with the patient rotated toward the right. Right internal jugular catheter tip projects over superior vena cava. Cardiac silhouette is stable. There is chronic elevation of the right hemidiaphragm. There is right basilar consolidation and small right pleural effusion. No pneumothorax. Stable central vascular congestion. IMPRESSION: 1. No complication after right internal jugular catheter placement. 2. Vascular congestion, with continued right basilar consolidation and small effusion. Electronically Signed   By: Randa Ngo M.D.   On: 01/17/2020 19:28   DG Chest Port 1 View  Result Date: 01/17/2020 CLINICAL DATA:  Cough for a week.  Nausea vomiting. EXAM: PORTABLE CHEST 1 VIEW COMPARISON:  April 11, 2012 and May 01, 2013 FINDINGS: The left hilum is more prominent than the right but this is a stable finding. The heart, hila, and mediastinum are  unremarkable. The left lung is clear. Elevation right hemidiaphragm is identified with postsurgical changes. Opacity in the medial right lung base is identified. No other acute abnormalities. IMPRESSION: 1. Opacity in the medial right lung base could represent confluence of shadows or developing  infiltrate. Recommend clinical correlation and attention on follow-up. Electronically Signed   By: Dorise Bullion III M.D   On: 01/17/2020 13:35    Pending Labs Unresulted Labs (From admission, onward)    Start     Ordered   01/19/20 0500  Vancomycin, random  Once-Timed,   R     01/17/20 1516   01/18/20 1607  Blood gas, arterial  Once,   R     01/17/20 1607   01/18/20 0500  CBC  Tomorrow morning,   R     01/17/20 1508   01/18/20 0500  Magnesium  Tomorrow morning,   R     01/17/20 1508   01/18/20 0500  Phosphorus  Tomorrow morning,   R     01/17/20 1508   01/18/20 0500  Creatinine, serum  Daily,   R     01/17/20 1516   01/17/20 1930  Urine drug profile, 9 drugs (performed at [facility])  Once,   R     01/17/20 1930   01/17/20 0000000  Basic metabolic panel  Now then every 4 hours,   R (with STAT occurrences)     01/17/20 1923   01/17/20 1904  Beta-hydroxybutyric acid  Once,   STAT     01/17/20 1903   01/17/20 1815  Osmolality  Add-on,   AD     01/17/20 1814   01/17/20 1753  Ethylene glycol  ONCE - STAT,   STAT     01/17/20 1752   01/17/20 1540  Volatiles,Blood (acetone,ethanol,isoprop,methanol)  Once,   STAT     01/17/20 1539   01/17/20 1507  Strep pneumoniae urinary antigen  (not at [facility])  Once,   STAT     01/17/20 1508   01/17/20 1507  Legionella Pneumophila Serogp 1 Ur Ag  Once,   STAT     01/17/20 1508   01/17/20 1503  HIV Antibody (routine testing w rflx)  (HIV Antibody (Routine testing w reflex) panel)  Once,   STAT     01/17/20 1508   01/17/20 1345  Urine culture  ONCE - STAT,   STAT     01/17/20 1345   01/17/20 1342  Blood culture (routine x 2)  BLOOD CULTURE X 2,   STAT     01/17/20  1342          Vitals/Pain Today's Vitals   01/17/20 1815 01/17/20 1851 01/17/20 1853 01/17/20 1900  BP: (!) 106/45 101/89  (!) 116/46  Pulse: (!) 114 (!) 115  (!) 117  Resp: (!) 34 (!) 32  (!) 26  Temp:   98.9 F (37.2 C)   TempSrc:   Rectal   SpO2: 98% 99%  99%  Weight:      Height:      PainSc:        Isolation Precautions No active isolations  Medications Medications  docusate sodium (COLACE) capsule 100 mg (has no administration in time range)  polyethylene glycol (MIRALAX / GLYCOLAX) packet 17 g (has no administration in time range)  heparin injection 5,000 Units (has no administration in time range)  ondansetron (ZOFRAN) injection 4 mg (has no administration in time range)  pantoprazole (PROTONIX) injection 40 mg (has no administration in time range)  albuterol (PROVENTIL) (2.5 MG/3ML) 0.083% nebulizer solution 2.5 mg (has no administration in time range)  vancomycin variable dose per unstable renal function (pharmacist dosing) (has no administration in time range)  piperacillin-tazobactam (ZOSYN) IVPB 2.25 g (has no administration in time range)  sodium bicarbonate 150 mEq in dextrose 5% 1000 mL infusion (150 mEq Intravenous New Bag/Given 01/17/20 1624)  fomepizole (ANTIZOL) 1,120 mg in sodium chloride 0.9 % 100 mL IVPB (1,120 mg Intravenous New Bag/Given 01/17/20 1944)  sodium chloride 0.9 % bolus 1,000 mL (0 mLs Intravenous Stopped 01/17/20 1610)  ondansetron (ZOFRAN) injection 4 mg (4 mg Intravenous Given 01/17/20 1246)  vancomycin (VANCOCIN) IVPB 1000 mg/200 mL premix (0 mg Intravenous Stopped 01/17/20 1610)  sodium chloride 0.9 % bolus 1,000 mL (0 mLs Intravenous Stopped 01/17/20 1611)    And  sodium chloride 0.9 % bolus 1,000 mL (0 mLs Intravenous Stopped 01/17/20 1636)    And  sodium chloride 0.9 % bolus 500 mL (0 mLs Intravenous Stopped 01/17/20 1611)  dextrose 50 % solution 50 mL (50 mLs Intravenous Given 01/17/20 1425)  insulin aspart (novoLOG) injection 10 Units  (10 Units Intravenous Given 01/17/20 1430)  piperacillin-tazobactam (ZOSYN) IVPB 3.375 g (0 g Intravenous Stopped 01/17/20 1611)  morphine 4 MG/ML injection 4 mg (4 mg Intravenous Given 01/17/20 1520)  calcium gluconate 1 g/ 50 mL sodium chloride IVPB (0 g Intravenous Stopped 01/17/20 1726)  lactated ringers bolus 1,000 mL (0 mLs Intravenous Stopped 01/17/20 1943)    Mobility walks

## 2020-01-17 NOTE — Progress Notes (Signed)
[facility] Progress Note Patient Name: [name] DOB: Nov 14, 1955 MRN: [id number]   Date of Service  01/17/2020  HPI/Events of Note  Pt admitted with concern for possible ingestion of ethylene glycol or other volatile alcohol, patient is on Fomepizole and also receiving hemodialysis.  eICU Interventions  New Patient Evaluation completed.        [name] Ruthella Kirchman 01/17/2020, 11:24 PM

## 2020-01-17 NOTE — ED Notes (Signed)
[facility] called for transport to [facility]

## 2020-01-17 NOTE — Consult Note (Signed)
[name] Admit Date: 01/17/2020 01/17/2020 Rexene Agent Requesting Physician:  Lake Bells MD  Reason for Consult:  AKI, Acidosis HPI:  86F PMH alcoholism, hx/o hairspray ingestion several years ago for self-harm presented to ED With nausea, malaise and recent URI Sx.  Self treating with OTC coracidin / robitussin.  Presenting labs with SCr 3.1, K 6,2, HCO3 <7, large AG.  Rpt labs after IVF SCr 1.8, K 4.9, HCO3 <7; ABG 7.04/16/107.  Volatiles pending. Serum osmoles pending.  UA no ketones, +Hb, trace protein, no crystals reported. Renal US with no HN or obstruction.  Lactate around 5. EKG w/o peaked T waves or prolongation of QRS/PR interval  PT awake and alert. She states has used wine and bourbon but denies other ingestions.  No heavy APAP use.    Pt w/o vision changes, abd pain.  She denies use of antifreeze, moonshine, or any other chemicals.  Her husband is at bedside. He feels she is not necessarily trustworthy in her responses.  He is not aware of any known ingestions but is clear that she could have found something.    Creatinine, Ser (mg/dL)  Date Value  01/17/2020 2.80 (H)  01/17/2020 3.13 (H)  05/01/2013 0.82  04/13/2012 0.67  04/12/2012 0.97  04/11/2012 1.18 (H)  04/11/2012 1.48 (H)  03/20/2009 0.61  ] ROS Balance of 12 systems is negative w/ exceptions as above  PMH  Past Medical History:  Diagnosis Date  . Alcohol abuse   . Anxiety   . Cancer ([place])    lung  . Diabetes mellitus   . Headache(784.0)   . Hypertension   . Pancreatitis, acute    PSH  Past Surgical History:  Procedure Laterality Date  . APPENDECTOMY    . CESAREAN SECTION    . JOINT REPLACEMENT     rt shoulder  . LUNG LOBECTOMY    . ORIF HUMERUS FRACTURE  04/13/2012   Procedure: OPEN REDUCTION INTERNAL FIXATION (ORIF) PROXIMAL HUMERUS FRACTURE;  Surgeon: Augustin Schooling, MD;  Location: [facility];  Service: Orthopedics;  Laterality: Left;  open reduction internal fixation left proximal humerus  fracture  . SHOULDER SURGERY     FH  Family History  Problem Relation Age of Onset  . Hypertension Mother   . COPD Father   . Diabetes Father    SH  reports that she has never smoked. She has never used smokeless tobacco. She reports current alcohol use. She reports that she does not use drugs. Allergies  Allergies  Allergen Reactions  . Ciprofloxacin Diarrhea   Home medications Prior to Admission medications   Medication Sig Start Date End Date Taking? Authorizing Provider  atorvastatin (LIPITOR) 10 MG tablet Take 10 mg by mouth daily.  04/18/16  Yes [provider]  busPIRone (BUSPAR) 10 MG tablet Take 20 mg by mouth 2 (two) times daily.   Yes [provider]  Candesartan Cilexetil-HCTZ 32-25 MG TABS Take 1 tablet by mouth every morning.   Yes [provider]  diltiazem (CARDIZEM CD) 180 MG 24 hr capsule Take 180 mg by mouth daily.   Yes [provider]  escitalopram (LEXAPRO) 20 MG tablet Take 20 mg by mouth daily.   Yes [provider]  insulin aspart (NOVOLOG FLEXPEN) 100 UNIT/ML FlexPen Inject 0-10 Units into the skin 3 (three) times daily as needed for high blood sugar.   Yes [provider]  metFORMIN (GLUCOPHAGE-XR) 500 MG 24 hr tablet Take 500 mg by mouth daily with  breakfast.  03/31/16  Yes [provider]  pramipexole (MIRAPEX) 0.5 MG tablet Take 0.5 mg by mouth at bedtime.  04/11/16  Yes [provider]  TOUJEO SOLOSTAR 300 UNIT/ML Solostar Pen Inject 24 Units into the skin daily.  11/18/19  Yes [provider]  traMADol (ULTRAM) 50 MG tablet Take 50 mg by mouth every 6 (six) hours as needed for moderate pain.  03/12/16  Yes [provider]  traZODone (DESYREL) 150 MG tablet Take 150 mg by mouth at bedtime. 11/11/19  Yes [provider]  vitamin B-12 (CYANOCOBALAMIN) 1000 MCG tablet Take 1,000 mcg by mouth daily.   Yes [provider]  vitamin C (ASCORBIC ACID) 500 MG  tablet Take 500 mg by mouth daily.   Yes [provider]  insulin detemir (LEVEMIR FLEXPEN) 100 UNIT/ML injection Inject 15 Units into the skin daily. 04/16/12 05/01/13  Elvera Lennox, MD  Insulin Pen Needle 29G X 12MM MISC Dispense insulin pen needles 04/16/12   Marye Round, Sorin C, MD  metFORMIN (GLUCOPHAGE) 500 MG tablet Take 1 tablet (500 mg total) by mouth 2 (two) times daily with a meal. 04/16/12 05/01/13  Elvera Lennox, MD    Current Medications Scheduled Meds: . heparin  5,000 Units Subcutaneous Q8H  . pantoprazole (PROTONIX) IV  40 mg Intravenous QHS  . vancomycin variable dose per unstable renal function (pharmacist dosing)   Does not apply See admin instructions   Continuous Infusions: . fomepizole (ANTIZOL) IV    . piperacillin-tazobactam (ZOSYN)  IV    . sodium bicarbonate 150 mEq in dextrose 5% 1000 mL 150 mEq (01/17/20 1624)   PRN Meds:.albuterol, docusate sodium, ondansetron (ZOFRAN) IV, polyethylene glycol  CBC Recent Labs  Lab 01/17/20 1221  WBC 27.7*  NEUTROABS 23.9*  HGB 15.9*  HCT 51.3*  MCV 103.0*  PLT [id number]*   Basic Metabolic Panel Recent Labs  Lab 01/17/20 1221 01/17/20 1600  NA 132* 134*  K 6.2* 4.9  CL 99 109  CO2 <7* <7*  GLUCOSE 211* 258*  BUN 52* 51*  CREATININE 3.13* 2.80*  CALCIUM 7.4* 6.1*    Physical Exam  Blood pressure 101/89, pulse (!) 115, temperature 98.9 F (37.2 C), temperature source Rectal, resp. rate (!) 32, height 5\' 8"  (1.727 m), weight 74.8 kg, SpO2 99 %. GEN: anxious ENT: NCAT EYES: EOMI CV: tachy, regular, nl s1s2 PULM: inc RR, CTAB ABD: s/nt/nd, no r/g,  SKIN: no rashes/lesions throughout EXT:no LEE  Assessment 13F with AKI and severe inc AG metabolic acidosis.    1. Inc AG metabolic acidosis: not overtly in shock, lactate not that high, urine ketones negative and serum glucose ok.  No clear explanation for such a severe acidosis and in this setting toxic alcohol ingestion is very likely.   2. AKI,   3. Hyperkalemia, 2/2 #1, #2, ?ARB use if takign at home 4. DM2 on metformin, not on SGLT2ing, metformin held 5. Hx/o alcohol use 6. Hx/o intentional ingestion of hair spray 7. anxiety  Plan 1. Agree with fomepizole 2. CCM placed HD cath, appreciate 3. Pt to transfer to [facility] 4. Cont HCO3 gtt at this time 5. If osmolal gap elevated, volatiles are positive, pt gives confirmatory history, or acidosis persists will need HD tonight 6. Trend BMP q4h   Rexene Agent  01/17/2020, 6:58 PM

## 2020-01-17 NOTE — ED Notes (Signed)
Report given to [facility]. 

## 2020-01-17 NOTE — ED Notes (Signed)
Date and time results received: 01/17/20 1504  Test: Lactic Acid  Critical Value: 5.5  Name of Provider Notified: Delo MD

## 2020-01-17 NOTE — ED Notes (Signed)
Purewick placed at this time.

## 2020-01-17 NOTE — Progress Notes (Signed)
Pharmacy Consulted to dose Fomepizole with suspected ethylene glycol toxicity  Assessment and Plan: -fomepizole initial loading dose 15mg /kg (1120mg ) IV x 1 - dose #2,#3,#4 and #5 10mg /kg per [facility] recommendations  -dose not on dialysis: administer Q12h - follow up if pt to go on dialysis as the frequency of dosing needs to be altered during dialysis - continue fomepizole until ethylene glycol or methanol concentrations are undetectable or have been reduced below 20 mg/dl  Dolly Rias RPh 01/17/2020, 6:42 PM

## 2020-01-17 NOTE — H&P (Signed)
NAME:  STEVE SCHEIBLE, MRN:  [id number], DOB:  [birth year], LOS: 0 ADMISSION DATE:  01/17/2020, CONSULTATION DATE:  01/17/2020 REFERRING MD:  Dr. Stark Jock, CHIEF COMPLAINT:  Sepsis    Brief History   64 year old female presented with generalized complaints of not feeling well with associated nausea, headache, and chills, work-up on admission revealed sepsis due with unknown etiology.   History of present illness   [name] is a 64 year old female with a past medical history significant for type 2 diabetes, migraines, hypertension, alcohol abuse, pancreatitis, and lung cancer status post resection who presented to the emergency department with general feeling unwell.  She reports 2 days prior to admission she began feeling unwell with nausea and headache but denies any vomiting or diarrhea.  She reports no sick contact.  She denies any fever or chills that began 2 days prior.  Denies any chest pain, congestion, urinary symptoms or lower extremity edema.  On my assessment patient denied any cough or congestion.  Patient reported 1 week history of cough prior to admission to ED provider.  Work-up on admission revealed vital signs significant for hypothermia, mild tachypnea and hypertension.  Lab work significant multiple abnormalities including hyponatremia, hyperkalemia K 6.2, severely reduced C02 < 7, hyperglycemia, elevated creatinine of 3.13 elevated lipase 66, elevated high-sensitivity troponin of 151, hypocalcemia protein corrected Ca 7.6, WBC 27.7, hemoglobin 15.9, and platelet count 1370.  Vital signs and lab work consistent with septic picture etiology unknown at this time.  Of note patient has a history of ingesting 3-4 bottle of hairspray in 04/2013 but she denies any intentional ingestion of illicit drugs or chemicals in order to become intoxicated. She states she continues to drink a glass of wine at night but has not drink to intoxication in several years.    Past Medical History    Hypertension  Type 2 diabetes History of lung cancer status post resection Anxiety Alcohol abuse Headache  Significant Hospital Events   Admitted 4/23  Consults:    Procedures:    Significant Diagnostic Tests:  Chest xray 4/23 > 1. Opacity in the medial right lung base could represent confluence of shadows or developing infiltrate. Recommend clinical correlation and attention on follow-up.  Micro Data:  COVID 4/23 > Blood culture 4/23 > Urine culture 4/23  Antimicrobials:     Interim history/subjective:  Seen lying in bed with continued complaints of feeling unwell.  Objective   Blood pressure (!) 87/37, pulse 88, temperature (!) 91.7 F (33.2 C), temperature source Rectal, resp. rate (!) 28, height 5\' 8"  (1.727 m), weight 74.8 kg, SpO2 99 %.       No intake or output data in the 24 hours ending 01/17/20 1447 Filed Weights   01/17/20 1444  Weight: 74.8 kg    Examination: General: Chronically ill appearing elderly female lying in bed in NAD HEENT: [place]/AT, MM pink/dry, PERRL,  Neuro: Alert and oriented x3, non-focal  CV: s1s2 regular rate and rhythm, no murmur, rubs, or gallops,  PULM:  Faint expiratory wheeze right greater then left, no increased work of breathing, no accessory muscle use GI: soft, bowel sounds hypoactive in all 4 quadrants, non-tender to palpation of all quadrants, non-distended Extremities: warm/dry, no edema  Skin: no rashes or lesions  Resolved Hospital Problem list     Assessment & Plan:  Sepsis -Patient presents with hypotension, tachypnea, hypotension, leukocytosis, and acute concern for infection meeting criteria for sepsis  -Etiology unknown at this time, CXR with concern for right  middle lobe infiltrate  P: Admit ICU Continue supplemental oxygen Pan cultures prior to antibiotic IV vanc and Zosyn  Aggressive IV hydration provided on admission MAP< 65 may need pressors BP currently soft  Trend lactic acid Obtain  procalcitonin Monitor urine output  Concern for HCAP HX of Right VATs with right middle lobectomy -No acute signs of significant pneumonia but meets sepsis criteria on asdmission P: Continue supplemental oxygne Head of bed elevated 30 degrees. Follow intermittent chest x-ray and ABG.   Ensure adequate pulmonary hygiene  Follow cultures   Metabolic acidosis with elevated anion gap  Concern for ingestion of toxic chemical -pH 7.048 / 15.9 / 107 / 4.5, expected Co2 is between 13-17 P: Obtain urinalysis to assess for oxalate crystals  Start sodium bicarb drip  Trend Bmets Notify poison control   Multiple significant electrolyte abnormalities  -Potassium 6.2, CO2 < 7, Ca 7.4,  P: Frequent Bmets  May need dose of lokelma if potassium remains elevated after hydration Supplement calcium  Bicarb drip as above   Acute Kidney Injury  -Per chart review last reported creatine 0.82 04/2013, creatinine on admission 3.13 P: Follow renal function / urine output Trend Bmet Avoid nephrotoxins, ensure adequate renal perfusion  IV hydration Obtain urine lytes  Obtain renal ultrasound   Elevated troponin -Troponin trend; 155 > 177. Patient denies any chest pain currently P: 12 lead EKG pending  ECHO pending  Continuous telemetry  Trend troponin's until peal   Type 2 diabetes  P: SSI Obtain hemoglobin A1c  Best practice:  Diet: NPO Pain/Anxiety/Delirium protocol (if indicated): PRNs VAP protocol (if indicated): N/A DVT prophylaxis: Subq heparin  GI prophylaxis: PPI Glucose control: SSI Mobility: Up with assistance  Code Status: Full Code  Family Communication: Updated over the phone  Disposition: ICU   Labs   CBC: Recent Labs  Lab 01/17/20 1221  WBC 27.7*  NEUTROABS 23.9*  HGB 15.9*  HCT 51.3*  MCV 103.0*  PLT 130*    Basic Metabolic Panel: Recent Labs  Lab 01/17/20 1221  NA 132*  K 6.2*  CL 99  CO2 <7*  GLUCOSE 211*  BUN 52*  CREATININE 3.13*    CALCIUM 7.4*   GFR: Estimated Creatinine Clearance: 18.1 mL/min (A) (by C-G formula based on SCr of 3.13 mg/dL (H)). Recent Labs  Lab 01/17/20 1221  WBC 27.7*    Liver Function Tests: Recent Labs  Lab 01/17/20 1221  AST 50*  ALT 23  ALKPHOS 125  BILITOT 0.5  PROT 6.8  ALBUMIN 3.7   Recent Labs  Lab 01/17/20 1221  LIPASE 66*   No results for input(s): AMMONIA in the last 168 hours.  ABG No results found for: PHART, PCO2ART, PO2ART, HCO3, TCO2, ACIDBASEDEF, O2SAT   Coagulation Profile: No results for input(s): INR, PROTIME in the last 168 hours.  Cardiac Enzymes: No results for input(s): CKTOTAL, CKMB, CKMBINDEX, TROPONINI in the last 168 hours.  HbA1C: Hgb A1c MFr Bld  Date/Time Value Ref Range Status  04/11/2012 07:00 PM 14.5 (H) <5.7 % Final    Comment:    (NOTE)  According to the ADA Clinical Practice Recommendations for 2011, when HbA1c is used as a screening test:  >=6.5%   Diagnostic of Diabetes Mellitus           (if abnormal result is confirmed) 5.7-6.4%   Increased risk of developing Diabetes Mellitus References:Diagnosis and Classification of Diabetes Mellitus,Diabetes [id number] 1):S62-S69 and Standards of Medical Care in         Diabetes - 2011,Diabetes [id number] (Suppl 1):S11-S61.    CBG: No results for input(s): GLUCAP in the last 168 hours.  Review of Systems: Positives in bold   Gen: Denies fever, chills, weight change, fatigue, night sweats HEENT: Denies blurred vision, double vision, hearing loss, tinnitus, sinus congestion, rhinorrhea, sore throat, neck stiffness, dysphagia PULM: Denies shortness of breath, cough, sputum production, hemoptysis, wheezing CV: Denies chest pain, edema, orthopnea, paroxysmal nocturnal dyspnea, palpitations GI: Denies abdominal pain, nausea, vomiting, diarrhea, hematochezia, melena, constipation, change in bowel habits GU:  Denies dysuria, hematuria, polyuria, oliguria, urethral discharge Endocrine: Denies hot or cold intolerance, polyuria, polyphagia or appetite change Derm: Denies rash, dry skin, scaling or peeling skin change Heme: Denies easy bruising, bleeding, bleeding gums Neuro: Denies headache, numbness, weakness, slurred speech, loss of memory or consciousness  Past Medical History  She,  has a past medical history of Alcohol abuse, Anxiety, Cancer ([place]), Diabetes mellitus, Headache(784.0), Hypertension, and Pancreatitis, acute.   Surgical History    Past Surgical History:  Procedure Laterality Date  . APPENDECTOMY    . CESAREAN SECTION    . JOINT REPLACEMENT     rt shoulder  . LUNG LOBECTOMY    . ORIF HUMERUS FRACTURE  04/13/2012   Procedure: OPEN REDUCTION INTERNAL FIXATION (ORIF) PROXIMAL HUMERUS FRACTURE;  Surgeon: Augustin Schooling, MD;  Location: [facility];  Service: Orthopedics;  Laterality: Left;  open reduction internal fixation left proximal humerus fracture  . SHOULDER SURGERY       Social History   reports that she has never smoked. She has never used smokeless tobacco. She reports current alcohol use. She reports that she does not use drugs.   Family History   Her family history includes COPD in her father; Diabetes in her father; Hypertension in her mother.   Allergies Allergies  Allergen Reactions  . Ciprofloxacin Diarrhea     Home Medications  Prior to Admission medications   Medication Sig Start Date End Date Taking? Authorizing Provider  atorvastatin (LIPITOR) 10 MG tablet  04/18/16   [provider]  BD PEN NEEDLE NANO U/F 32G X 4 MM MISC  04/08/16   [provider]  busPIRone (BUSPAR) 10 MG tablet Take 20 mg by mouth 2 (two) times daily.    [provider]  Candesartan Cilexetil-HCTZ 32-25 MG TABS Take 1 tablet by mouth every morning.    [provider]  CARTIA XT 180 MG 24 hr capsule  04/08/16   [provider]  diltiazem  (DILACOR XR) 180 MG 24 hr capsule Take 180 mg by mouth daily.    [provider]  escitalopram (LEXAPRO) 20 MG tablet Take 20 mg by mouth daily.    [provider]  Cleda Clarks 100 UNIT/ML [facility]  02/10/16   [provider]  insulin detemir (LEVEMIR FLEXPEN) 100 UNIT/ML injection Inject 15 Units into the skin daily. 04/16/12 05/01/13  Elvera Lennox, MD  Insulin Pen Needle 29G X 12MM MISC Dispense insulin pen needles 04/16/12   Elvera Lennox, MD  Lancets [facility] ULTRASOFT) lancets  03/24/16  [provider]  metFORMIN (GLUCOPHAGE) 500 MG tablet Take 1 tablet (500 mg total) by mouth 2 (two) times daily with a meal. 04/16/12 05/01/13  Elvera Lennox, MD  metFORMIN (GLUCOPHAGE-XR) 500 MG 24 hr tablet  03/31/16   [provider]  ONE TOUCH ULTRA TEST test strip  02/05/16   [provider]  pramipexole (MIRAPEX) 0.5 MG tablet  04/11/16   [provider]  traMADol Veatrice Bourbon) 50 MG tablet  03/12/16   [provider]  traZODone (DESYREL) 100 MG tablet Take 100 mg by mouth at bedtime.    [provider]  vitamin B-12 (CYANOCOBALAMIN) 1000 MCG tablet Take 1,000 mcg by mouth daily.    [provider]  vitamin C (ASCORBIC ACID) 500 MG tablet Take 500 mg by mouth daily.    [provider]     Critical care time:    Performed by: Johnsie Cancel  Total critical care time: 55 minutes  Critical care time was exclusive of separately billable procedures and treating other patients.  Critical care was necessary to treat or prevent imminent or life-threatening deterioration.  Critical care was time spent personally by me on the following activities: development of treatment plan with patient and/or surrogate as well as nursing, discussions with consultants, evaluation of patient's response to treatment, examination of patient, obtaining history from patient or surrogate, ordering and performing treatments and  interventions, ordering and review of laboratory studies, ordering and review of radiographic studies, pulse oximetry and re-evaluation of patient's condition.  Johnsie Cancel, NP-C [facility] Contact / Pager information can be found on Amion  01/17/2020, 4:02 PM

## 2020-01-17 NOTE — ED Notes (Signed)
Attempted to call report to [facility]. RN unable to take report at this time and states she will call back.

## 2020-01-17 NOTE — ED Provider Notes (Addendum)
[facility] DEPT Provider Note   CSN: [id number] Arrival date & time: 01/17/20  1158     History Chief Complaint  Patient presents with  . Nausea  . Emesis    [name] is a 64 y.o. female.  Patient is a 64 year old female with past medical history of diabetes, hypertension, renal insufficiency.  She presents today for evaluation of nausea, headache, and chills.  She reports a 1 week history of cough preceding these symptoms.  Patient denies any chest pain or difficulty breathing.  She denies any abdominal pain.  The history is provided by the patient.  Emesis Severity:  Moderate Duration:  1 day Timing:  Constant Progression:  Worsening Chronicity:  New Recent urination:  Normal Relieved by:  Nothing Worsened by:  Nothing Ineffective treatments:  None tried Associated symptoms: chills, cough and headaches   Associated symptoms: no abdominal pain, no diarrhea and no fever        Past Medical History:  Diagnosis Date  . Alcohol abuse   . Anxiety   . Cancer ([place])    lung  . Diabetes mellitus   . Headache(784.0)   . Hypertension   . Pancreatitis, acute     Patient Active Problem List   Diagnosis Date Noted  . Dehydration 04/11/2012  . ARF (acute renal failure) ([place]) 04/11/2012  . DM type 2 causing complication ([place]) 123456  . HTN (hypertension) 04/11/2012  . Alcohol abuse 04/11/2012  . Hx of cancer of lung 04/11/2012  . Hyponatremia 04/11/2012  . Fracture of left humerus 04/11/2012  . Pancreatitis, acute     Past Surgical History:  Procedure Laterality Date  . APPENDECTOMY    . CESAREAN SECTION    . JOINT REPLACEMENT     rt shoulder  . LUNG LOBECTOMY    . ORIF HUMERUS FRACTURE  04/13/2012   Procedure: OPEN REDUCTION INTERNAL FIXATION (ORIF) PROXIMAL HUMERUS FRACTURE;  Surgeon: Augustin Schooling, MD;  Location: [facility];  Service: Orthopedics;  Laterality: Left;  open reduction internal fixation left proximal humerus  fracture  . SHOULDER SURGERY       OB History   No obstetric history on file.     Family History  Problem Relation Age of Onset  . Hypertension Mother   . COPD Father   . Diabetes Father     Social History   Tobacco Use  . Smoking status: Never Smoker  . Smokeless tobacco: Never Used  Substance Use Topics  . Alcohol use: Yes    Comment: occasional  . Drug use: No    Home Medications Prior to Admission medications   Medication Sig Start Date End Date Taking? Authorizing Provider  atorvastatin (LIPITOR) 10 MG tablet  04/18/16   [provider]  BD PEN NEEDLE NANO U/F 32G X 4 MM MISC  04/08/16   [provider]  busPIRone (BUSPAR) 10 MG tablet Take 20 mg by mouth 2 (two) times daily.    [provider]  Candesartan Cilexetil-HCTZ 32-25 MG TABS Take 1 tablet by mouth every morning.    [provider]  CARTIA XT 180 MG 24 hr capsule  04/08/16   [provider]  diltiazem (DILACOR XR) 180 MG 24 hr capsule Take 180 mg by mouth daily.    [provider]  escitalopram (LEXAPRO) 20 MG tablet Take 20 mg by mouth daily.    [provider]  Cleda Clarks 100 UNIT/ML Mayer Masker  02/10/16   [provider]  insulin detemir (LEVEMIR FLEXPEN) 100 UNIT/ML injection Inject 15 Units into the skin daily. 04/16/12 05/01/13  Elvera Lennox, MD  Insulin Pen Needle 29G X 12MM MISC Dispense insulin pen needles 04/16/12   Marye Round, Sorin C, MD  Lancets [facility] ULTRASOFT) lancets  03/24/16   [provider]  metFORMIN (GLUCOPHAGE) 500 MG tablet Take 1 tablet (500 mg total) by mouth 2 (two) times daily with a meal. 04/16/12 05/01/13  Elvera Lennox, MD  metFORMIN (GLUCOPHAGE-XR) 500 MG 24 hr tablet  03/31/16   [provider]  ONE TOUCH ULTRA TEST test strip  02/05/16   [provider]  pramipexole (MIRAPEX) 0.5 MG tablet  04/11/16   [provider]  traMADol Veatrice Bourbon) 50 MG tablet  03/12/16   [provider]  traZODone (DESYREL) 100 MG tablet Take 100 mg by mouth at bedtime.    [provider]  vitamin B-12 (CYANOCOBALAMIN) 1000 MCG tablet Take 1,000 mcg by mouth daily.    [provider]  vitamin C (ASCORBIC ACID) 500 MG tablet Take 500 mg by mouth daily.    [provider]    Allergies    Ciprofloxacin  Review of Systems   Review of Systems  Constitutional: Positive for chills. Negative for fever.  Respiratory: Positive for cough.   Gastrointestinal: Positive for vomiting. Negative for abdominal pain and diarrhea.  Neurological: Positive for headaches.  All other systems reviewed and are negative.   Physical Exam Updated Vital Signs BP (!) 97/43   Pulse 86   Resp 18   SpO2 94%   Physical Exam Vitals and nursing note reviewed.  Constitutional:      General: She is not in acute distress.    Appearance: She is well-developed. She is not diaphoretic.  HENT:     Head: Normocephalic and atraumatic.  Cardiovascular:     Rate and Rhythm: Normal rate and regular rhythm.     Heart sounds: No murmur. No friction rub. No gallop.   Pulmonary:     Effort: Pulmonary effort is normal. No respiratory distress.     Breath sounds: Normal breath sounds. No wheezing.  Abdominal:     General: Bowel sounds are normal. There is no distension.     Palpations: Abdomen is soft.     Tenderness: There is no abdominal tenderness.  Musculoskeletal:        General: Normal range of motion.     Cervical back: Normal range of motion and neck supple.  Skin:    General: Skin is warm and dry.  Neurological:     Mental Status: She is alert and oriented to person, place, and time.     ED Results / Procedures / Treatments   Labs (all labs ordered are listed, but only abnormal results are displayed) Labs Reviewed  COMPREHENSIVE METABOLIC PANEL  CBC WITH DIFFERENTIAL/PLATELET  LIPASE, BLOOD  URINALYSIS, ROUTINE W REFLEX MICROSCOPIC  TROPONIN I (HIGH SENSITIVITY)     EKG None  Radiology No results found.  Procedures Procedures (including critical care time)  Medications Ordered in ED Medications  sodium chloride 0.9 % bolus 1,000 mL (has no administration in time range)  ondansetron (ZOFRAN) injection 4 mg (has no administration in time range)    ED Course  I have reviewed the triage vital signs and the nursing notes.  Pertinent labs & imaging results that were available during my care of the patient were reviewed by me and considered in my medical decision making (see chart  for details).    MDM Rules/Calculators/A&P  Patient is a 64 year old female presenting here with complaints of cough for the past week, then developed chills, headache and nausea today.  Initial blood pressure in the 0000000 systolic.  Nursing had difficulty initially obtaining a temperature.  This was later taken and was 91 rectally.  At about the same time, her CBC returned with a white count of 28,000.  As the ED course went on, a sepsis picture began to emerge and she was treated accordingly with blood and urine cultures, and appropriate broad spectrum antibiotics. She was also given the sepsis fluid bolus.  Laboratory studies returned showing a metabolic acidosis with CO2 less than 7 and acute renal failure with creatinine of 3.1.  Potassium also found to be 6.2 and this was treated with glucose and insulin.  Care discussed with Dr. Lake Bells from critical care.  He will evaluate and likely admit.  CRITICAL CARE Performed by: Veryl Speak Total critical care time: 45 minutes Critical care time was exclusive of separately billable procedures and treating other patients. Critical care was necessary to treat or prevent imminent or life-threatening deterioration. Critical care was time spent personally by me on the following activities: development of treatment plan with patient and/or surrogate as well as nursing, discussions with consultants, evaluation of patient's  response to treatment, examination of patient, obtaining history from patient or surrogate, ordering and performing treatments and interventions, ordering and review of laboratory studies, ordering and review of radiographic studies, pulse oximetry and re-evaluation of patient's condition.   Final Clinical Impression(s) / ED Diagnoses Final diagnoses:  None    Rx / DC Orders ED Discharge Orders    None       Veryl Speak, MD 01/17/20 1438    Veryl Speak, MD 01/17/20 1444

## 2020-01-17 NOTE — Progress Notes (Signed)
eLink Physician-Brief Progress Note Patient Name: [name] DOB: 1956-06-06 MRN: [id number]   Date of Service  01/17/2020  HPI/Events of Note  Pt needs sliding scale insulin orders, Pt needs prn medications for headache / pain,  Pt's last PH on an ABG was 7.04 at 3 pm this afternoon.  eICU Interventions  SSI orders placed, PRN low dose morphine order placed, ABG ordered.        [name] Ogan 01/17/2020, 11:01 PM

## 2020-01-18 DIAGNOSIS — E872 Acidosis: Secondary | ICD-10-CM

## 2020-01-18 LAB — CBC
HCT: 42.1 % (ref 36.0–46.0)
Hemoglobin: 14.4 g/dL (ref 12.0–15.0)
MCH: 31.5 pg (ref 26.0–34.0)
MCHC: 34.2 g/dL (ref 30.0–36.0)
MCV: 92.1 fL (ref 80.0–100.0)
Platelets: 128 10*3/uL — ABNORMAL LOW (ref 150–400)
RBC: 4.57 MIL/uL (ref 3.87–5.11)
RDW: 13 % (ref 11.5–15.5)
WBC: 12.6 10*3/uL — ABNORMAL HIGH (ref 4.0–10.5)
nRBC: 0 % (ref 0.0–0.2)

## 2020-01-18 LAB — BASIC METABOLIC PANEL
Anion gap: 22 — ABNORMAL HIGH (ref 5–15)
Anion gap: 19 — ABNORMAL HIGH (ref 5–15)
Anion gap: 16 — ABNORMAL HIGH (ref 5–15)
Anion gap: 14 (ref 5–15)
BUN: 50 mg/dL — ABNORMAL HIGH (ref 8–23)
BUN: 48 mg/dL — ABNORMAL HIGH (ref 8–23)
BUN: 42 mg/dL — ABNORMAL HIGH (ref 8–23)
BUN: 29 mg/dL — ABNORMAL HIGH (ref 8–23)
CO2: 9 mmol/L — ABNORMAL LOW (ref 22–32)
CO2: 14 mmol/L — ABNORMAL LOW (ref 22–32)
CO2: 21 mmol/L — ABNORMAL LOW (ref 22–32)
CO2: 28 mmol/L (ref 22–32)
Calcium: 6.7 mg/dL — ABNORMAL LOW (ref 8.9–10.3)
Calcium: 8.3 mg/dL — ABNORMAL LOW (ref 8.9–10.3)
Calcium: 7.8 mg/dL — ABNORMAL LOW (ref 8.9–10.3)
Calcium: 7.6 mg/dL — ABNORMAL LOW (ref 8.9–10.3)
Chloride: 102 mmol/L (ref 98–111)
Chloride: 102 mmol/L (ref 98–111)
Chloride: 101 mmol/L (ref 98–111)
Chloride: 98 mmol/L (ref 98–111)
Creatinine, Ser: 2.65 mg/dL — ABNORMAL HIGH (ref 0.44–1.00)
Creatinine, Ser: 2.37 mg/dL — ABNORMAL HIGH (ref 0.44–1.00)
Creatinine, Ser: 2.13 mg/dL — ABNORMAL HIGH (ref 0.44–1.00)
Creatinine, Ser: 1.69 mg/dL — ABNORMAL HIGH (ref 0.44–1.00)
GFR calc Af Amer: 21 mL/min — ABNORMAL LOW (ref >60)
GFR calc Af Amer: 24 mL/min — ABNORMAL LOW (ref >60)
GFR calc Af Amer: 27 mL/min — ABNORMAL LOW (ref >60)
GFR calc Af Amer: 36 mL/min — ABNORMAL LOW (ref >60)
GFR calc non Af Amer: 18 mL/min — ABNORMAL LOW (ref >60)
GFR calc non Af Amer: 21 mL/min — ABNORMAL LOW (ref >60)
GFR calc non Af Amer: 24 mL/min — ABNORMAL LOW (ref >60)
GFR calc non Af Amer: 31 mL/min — ABNORMAL LOW (ref >60)
Glucose, Bld: 328 mg/dL — ABNORMAL HIGH (ref 70–99)
Glucose, Bld: 305 mg/dL — ABNORMAL HIGH (ref 70–99)
Glucose, Bld: 277 mg/dL — ABNORMAL HIGH (ref 70–99)
Glucose, Bld: 249 mg/dL — ABNORMAL HIGH (ref 70–99)
Potassium: 4.9 mmol/L (ref 3.5–5.1)
Potassium: 3.7 mmol/L (ref 3.5–5.1)
Potassium: 3.4 mmol/L — ABNORMAL LOW (ref 3.5–5.1)
Potassium: 2.9 mmol/L — ABNORMAL LOW (ref 3.5–5.1)
Sodium: 133 mmol/L — ABNORMAL LOW (ref 135–145)
Sodium: 135 mmol/L (ref 135–145)
Sodium: 138 mmol/L (ref 135–145)
Sodium: 140 mmol/L (ref 135–145)

## 2020-01-18 LAB — MAGNESIUM: Magnesium: 1.6 mg/dL — ABNORMAL LOW (ref 1.7–2.4)

## 2020-01-18 LAB — GLUCOSE, CAPILLARY
Glucose-Capillary: 189 mg/dL — ABNORMAL HIGH (ref 70–99)
Glucose-Capillary: 226 mg/dL — ABNORMAL HIGH (ref 70–99)
Glucose-Capillary: 261 mg/dL — ABNORMAL HIGH (ref 70–99)
Glucose-Capillary: 272 mg/dL — ABNORMAL HIGH (ref 70–99)
Glucose-Capillary: 282 mg/dL — ABNORMAL HIGH (ref 70–99)
Glucose-Capillary: 287 mg/dL — ABNORMAL HIGH (ref 70–99)

## 2020-01-18 LAB — BETA-HYDROXYBUTYRIC ACID: Beta-Hydroxybutyric Acid: 0.17 mmol/L (ref 0.05–0.27)

## 2020-01-18 LAB — PHOSPHORUS: Phosphorus: 3.9 mg/dL (ref 2.5–4.6)

## 2020-01-18 LAB — LACTIC ACID, PLASMA: Lactic Acid, Venous: 1.5 mmol/L (ref 0.5–1.9)

## 2020-01-18 MED ORDER — SODIUM CHLORIDE 0.9% FLUSH: 10.0000 mL | INTRAVENOUS | Status: DC | PRN

## 2020-01-18 MED ORDER — PIPERACILLIN-TAZOBACTAM 3.375 G IVPB
3.3750 g | Freq: Three times a day (TID) | INTRAVENOUS | Status: DC
  Administered 2020-01-18 – 2020-01-19 (×3): 3.375 g via INTRAVENOUS
  Filled 2020-01-18 (×4): qty 50

## 2020-01-18 MED ORDER — SODIUM CHLORIDE 0.9 % IV SOLN
750.0000 mg | Freq: Two times a day (BID) | INTRAVENOUS | Status: DC
  Filled 2020-01-18: qty 0.75

## 2020-01-18 MED ORDER — SODIUM CHLORIDE 0.9% FLUSH
10.0000 mL | Freq: Two times a day (BID) | INTRAVENOUS | Status: DC
  Administered 2020-01-18 – 2020-01-21 (×7): 10 mL

## 2020-01-18 MED ORDER — POTASSIUM CHLORIDE CRYS ER 20 MEQ PO TBCR
40.0000 meq | EXTENDED_RELEASE_TABLET | Freq: Once | ORAL | Status: AC
  Administered 2020-01-18: 40 meq via ORAL
  Filled 2020-01-18: qty 2

## 2020-01-18 MED ORDER — SODIUM CHLORIDE 0.9 % IV SOLN
750.0000 mg | Freq: Once | INTRAVENOUS | Status: AC
  Administered 2020-01-18: 750 mg via INTRAVENOUS
  Filled 2020-01-18: qty 0.75

## 2020-01-18 MED ORDER — MAGNESIUM SULFATE 2 GM/50ML IV SOLN
2.0000 g | Freq: Once | INTRAVENOUS | Status: AC
  Administered 2020-01-18: 2 g via INTRAVENOUS
  Filled 2020-01-18: qty 50

## 2020-01-18 MED ORDER — INSULIN GLARGINE 100 UNIT/ML ~~LOC~~ SOLN
10.0000 [IU] | Freq: Once | SUBCUTANEOUS | Status: AC
  Administered 2020-01-18: 10 [IU] via SUBCUTANEOUS
  Filled 2020-01-18: qty 0.1

## 2020-01-18 MED ORDER — CALCIUM GLUCONATE-NACL 2-0.675 GM/100ML-% IV SOLN
2.0000 g | Freq: Once | INTRAVENOUS | Status: AC
  Administered 2020-01-18: 2000 mg via INTRAVENOUS
  Filled 2020-01-18: qty 100

## 2020-01-18 MED ORDER — SODIUM BICARBONATE-DEXTROSE 150-5 MEQ/L-% IV SOLN
150.0000 meq | INTRAVENOUS | Status: DC
  Administered 2020-01-18: 150 meq via INTRAVENOUS
  Filled 2020-01-18: qty 1000

## 2020-01-18 MED ORDER — PALONOSETRON HCL INJECTION 0.25 MG/5ML: 0.2500 mg | Freq: Once | INTRAVENOUS | Status: DC

## 2020-01-18 MED ORDER — CHLORHEXIDINE GLUCONATE CLOTH 2 % EX PADS
6.0000 | MEDICATED_PAD | Freq: Every day | CUTANEOUS | Status: DC
  Administered 2020-01-18 – 2020-01-19 (×2): 6 via TOPICAL
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
## 2020-01-18 NOTE — Progress Notes (Signed)
Admit: 01/17/2020 LOS: 1  66F with AKI and severe metabolic acidosis  Subjective:  . Stable overnight . Acidosis imprved with NaHCO3 gtt . Serum Osmolal gap < 10, direct volatiles still pending . Given nl Osmolal gap and improved acidosis, did not get HD . Pt is awake/alert this AM, again denies toxic ingestions . Good UOP, downtrending SCr. K ok   04/23 0701 - 04/24 0700 In: 6917.2 [I.V.:1767.4; IV Piggyback:5149.8] Out: 2100 [Urine:2100]  Filed Weights   01/17/20 1444 01/18/20 0422  Weight: 74.8 kg 76.8 kg    Scheduled Meds: . Chlorhexidine Gluconate Cloth  6 each Topical Daily  . heparin  5,000 Units Subcutaneous Q8H  . insulin aspart  0-24 Units Subcutaneous Q4H  . insulin glargine  10 Units Subcutaneous Once  . pantoprazole (PROTONIX) IV  40 mg Intravenous QHS  . sodium chloride flush  10-40 mL Intracatheter Q12H   Continuous Infusions: . sodium chloride 10 mL/hr at 01/18/20 0800  . piperacillin-tazobactam (ZOSYN)  IV    . sodium bicarbonate 150 mEq in dextrose 5% 1000 mL 150 mEq (01/18/20 0909)   PRN Meds:.sodium chloride, albuterol, docusate sodium, heparin sodium (porcine), morphine injection, ondansetron (ZOFRAN) IV, polyethylene glycol, sodium chloride flush  Current Labs: reviewed    Physical Exam:  Blood pressure (!) 151/71, pulse (!) 116, temperature 99.1 F (37.3 C), temperature source Oral, resp. rate (!) 26, height 5\' 8"  (1.727 m), weight 76.8 kg, SpO2 98 %. chornically ill appearing, NAD Tachy regular, nls1s2 Inc RR, ctab No sig LEE No rashes/lesions nonfocal R IJ Temp HD cath  A 1. Inc AG metabolic acidosis: not overtly in shock, lactate not that high, urine ketones negative and serum glucose ok.  No clear explanation for such a severe acidosis. Osmolal gap not up. I don't have clear explanation of this  Is improving quickly with supportive care and NaHCO3.  No RRT needed at this point.   2. AKI, nonoliguric, improving 3. Hyperkalemia, 2/2 #1, #2,  ?ARB use if takign at home; resolved 4. DM2 on metformin, not on SGLT2inh, metformin held 5. Hx/o alcohol use 6. Hx/o intentional ingestion of hair spray 2014 7. anxiety   P . Cont supportive care, trend BMP through course of today . If stable tomorrow remove R IJ TEmp HD cath . F/u outstanding labs.   . Daily weights, Daily Renal Panel, Strict I/Os, Avoid nephrotoxins (NSAIDs, judicious IV Contrast)    Pearson Grippe MD 01/18/2020, 10:42 AM  Recent Labs  Lab 01/17/20 2307 01/17/20 2307 01/17/20 2327 01/18/20 0415 01/18/20 0955  NA 133*   < > 131* 135 138  K 4.9   < > 4.8 3.7 3.4*  CL 102  --   --  102 101  CO2 9*  --   --  14* 21*  GLUCOSE 328*  --   --  305* 277*  BUN 50*  --   --  48* 42*  CREATININE 2.65*  --   --  2.37* 2.13*  CALCIUM 6.7*  --   --  8.3* 7.8*  PHOS  --   --   --  3.9  --    < > = values in this interval not displayed.   Recent Labs  Lab 01/17/20 1221 01/17/20 2327 01/18/20 0415  WBC 27.7*  --  12.6*  NEUTROABS 23.9*  --   --   HGB 15.9* 15.0 14.4  HCT 51.3* 44.0 42.1  MCV 103.0*  --  92.1  PLT 130*  --  128*             

## 2020-01-18 NOTE — Progress Notes (Signed)
Initial Nutrition Assessment  DOCUMENTATION CODES:   Not applicable  INTERVENTION:  Advance diet as medically feasible  Provide nutrition supplements as appropriate   NUTRITION DIAGNOSIS:   Inadequate oral intake related to acute illness(sepsis) as evidenced by NPO status.  GOAL:   Patient will meet greater than or equal to 90% of their needs    MONITOR:   Labs, I & O's, Diet advancement, Weight trends  REASON FOR ASSESSMENT:   Malnutrition Screening Tool    ASSESSMENT:  RD working remotely.  64 year old female with past medical history of DM2, migraines, HTN, alcohol abuse, pancreatitis, lung cancer s/p resection who presented with generalized complaints of feeling unwell and 2 day history of nausea and headache, work-up on admission revealed sepsis due to unknown etiology.  4/23 - central venous catheter placed  Per notes, patient with previous history of ingesting 3-4 bottles of hairspray in 04/2013. Concerns for possibility of toxic ingestion, suspected ethylene glycol or another volatile alcohol given remarkably elevated anion gap and history of ingestion in the past. Nephrology consulted and HD initiated.  Patient is currently NPO, will continue to follow for diet advancement and provide supplements as appropriate.  Current wt 168.96 lbs No recent weight history for review, last weight prior to admission 173 lbs in August 2013.  Medications reviewed and include: SSI IVF: NaCl IVPB: Antizol, Zosyn Sodium bicarbonate 150 mEq in D5 Labs: CBGs 282,287,303,274, BUN 48 (H), Cr 2.37 (H), Mg 1.6 (L)  NUTRITION - FOCUSED PHYSICAL EXAM: Unable to complete at this time, RD working remotely.  Diet Order:   Diet Order            Diet NPO time specified  Diet effective now              EDUCATION NEEDS:   No education needs have been identified at this time  Skin:  Skin Assessment: Reviewed RN Assessment  Last BM:  4/23  Height:   Ht Readings from Last  1 Encounters:  01/17/20 5\' 8"  (1.727 m)    Weight:   Wt Readings from Last 1 Encounters:  01/18/20 76.8 kg    Ideal Body Weight:  63.6 kg  BMI:  Body mass index is 25.74 kg/m.  Estimated Nutritional Needs:   Kcal:  2000-2200  Protein:  100-110  Fluid:  >/= 2 L/day   Lajuan Lines, RD, LDN Clinical Nutrition After Hours/Weekend Pager # in [place]

## 2020-01-18 NOTE — Progress Notes (Signed)
eLink Physician-Brief Progress Note Patient Name: [name] DOB: 1955/12/13 MRN: [id number]   Date of Service  01/18/2020  HPI/Events of Note  K 2.9. HCO3 28. Still on NaHCO3 drip at 75cc/hr.   eICU Interventions  D/c bicarb drip. KCl 25mEq PO x1 dose.     Intervention Category Major Interventions: Electrolyte abnormality - evaluation and management  Charlott Rakes 01/18/2020, 10:44 PM

## 2020-01-19 LAB — BASIC METABOLIC PANEL
Anion gap: 13 (ref 5–15)
BUN: 27 mg/dL — ABNORMAL HIGH (ref 8–23)
CO2: 28 mmol/L (ref 22–32)
Calcium: 7.8 mg/dL — ABNORMAL LOW (ref 8.9–10.3)
Chloride: 100 mmol/L (ref 98–111)
Creatinine, Ser: 1.51 mg/dL — ABNORMAL HIGH (ref 0.44–1.00)
GFR calc Af Amer: 42 mL/min — ABNORMAL LOW (ref >60)
GFR calc non Af Amer: 36 mL/min — ABNORMAL LOW (ref >60)
Glucose, Bld: 156 mg/dL — ABNORMAL HIGH (ref 70–99)
Potassium: 2.8 mmol/L — ABNORMAL LOW (ref 3.5–5.1)
Sodium: 141 mmol/L (ref 135–145)

## 2020-01-19 LAB — GLUCOSE, CAPILLARY
Glucose-Capillary: 137 mg/dL — ABNORMAL HIGH (ref 70–99)
Glucose-Capillary: 188 mg/dL — ABNORMAL HIGH (ref 70–99)
Glucose-Capillary: 246 mg/dL — ABNORMAL HIGH (ref 70–99)
Glucose-Capillary: 223 mg/dL — ABNORMAL HIGH (ref 70–99)
Glucose-Capillary: 83 mg/dL (ref 70–99)

## 2020-01-19 LAB — VOLATILES,BLD-ACETONE,ETHANOL,ISOPROP,METHANOL
Acetone, blood: <0.01 g/dL (ref 0.000–0.010)
Ethanol, blood: 0.145 g/dL — ABNORMAL HIGH (ref 0.000–0.010)
Isopropanol, blood: <0.01 g/dL (ref 0.000–0.010)
Methanol, blood: <0.01 g/dL (ref 0.000–0.010)

## 2020-01-19 LAB — LEGIONELLA PNEUMOPHILA SEROGP 1 UR AG: L. pneumophila Serogp 1 Ur Ag: NEGATIVE

## 2020-01-19 LAB — ETHYLENE GLYCOL: Ethylene Glycol Lvl: <5 mg/dL

## 2020-01-19 MED ORDER — MAGNESIUM SULFATE 2 GM/50ML IV SOLN
2.0000 g | Freq: Once | INTRAVENOUS | Status: AC
  Administered 2020-01-19: 09:00:00 2 g via INTRAVENOUS
  Filled 2020-01-19: qty 50

## 2020-01-19 MED ORDER — FOLIC ACID 1 MG PO TABS
1.0000 mg | ORAL_TABLET | Freq: Every day | ORAL | Status: DC
  Administered 2020-01-20 – 2020-01-21 (×2): 1 mg via ORAL
  Filled 2020-01-19 (×3): qty 1

## 2020-01-19 MED ORDER — FOLIC ACID 5 MG/ML IJ SOLN
1.0000 mg | Freq: Once | INTRAMUSCULAR | Status: AC
  Administered 2020-01-19: 1 mg via INTRAVENOUS
  Filled 2020-01-19: qty 0.2

## 2020-01-19 MED ORDER — FOLIC ACID 1 MG PO TABS: 1.0000 mg | ORAL_TABLET | Freq: Every day | ORAL | Status: DC

## 2020-01-19 MED ORDER — THIAMINE HCL 100 MG PO TABS
100.0000 mg | ORAL_TABLET | Freq: Every day | ORAL | Status: DC
  Administered 2020-01-20 – 2020-01-21 (×2): 100 mg via ORAL
  Filled 2020-01-19 (×2): qty 1

## 2020-01-19 MED ORDER — ORAL CARE MOUTH RINSE
15.0000 mL | Freq: Two times a day (BID) | OROMUCOSAL | Status: DC
  Administered 2020-01-19 – 2020-01-21 (×5): 15 mL via OROMUCOSAL

## 2020-01-19 MED ORDER — LORAZEPAM 1 MG PO TABS
1.0000 mg | ORAL_TABLET | ORAL | Status: DC | PRN
  Administered 2020-01-19 – 2020-01-21 (×6): 1 mg via ORAL
  Filled 2020-01-19 (×6): qty 1

## 2020-01-19 MED ORDER — LACTATED RINGERS IV SOLN: INTRAVENOUS | Status: DC

## 2020-01-19 MED ORDER — THIAMINE HCL 100 MG/ML IJ SOLN
100.0000 mg | Freq: Every day | INTRAMUSCULAR | Status: DC
  Administered 2020-01-19: 100 mg via INTRAVENOUS
  Filled 2020-01-19 (×2): qty 2

## 2020-01-19 MED ORDER — CHLORHEXIDINE GLUCONATE 0.12 % MT SOLN
15.0000 mL | Freq: Two times a day (BID) | OROMUCOSAL | Status: DC
  Administered 2020-01-19 – 2020-01-21 (×5): 15 mL via OROMUCOSAL
  Filled 2020-01-19 (×4): qty 15

## 2020-01-19 MED ORDER — POTASSIUM CHLORIDE 10 MEQ/50ML IV SOLN
10.0000 meq | INTRAVENOUS | Status: AC
  Administered 2020-01-19 (×4): 10 meq via INTRAVENOUS
  Filled 2020-01-19 (×4): qty 50

## 2020-01-19 MED ORDER — ADULT MULTIVITAMIN W/MINERALS CH
1.0000 | ORAL_TABLET | Freq: Every day | ORAL | Status: DC
  Administered 2020-01-20 – 2020-01-21 (×2): 1 via ORAL
  Filled 2020-01-19 (×3): qty 1

## 2020-01-19 MED ORDER — POTASSIUM CHLORIDE CRYS ER 20 MEQ PO TBCR
20.0000 meq | EXTENDED_RELEASE_TABLET | Freq: Three times a day (TID) | ORAL | Status: DC
  Administered 2020-01-19 – 2020-01-21 (×6): 20 meq via ORAL
  Filled 2020-01-19 (×7): qty 1

## 2020-01-19 MED ORDER — LORAZEPAM 2 MG/ML IJ SOLN
1.0000 mg | INTRAMUSCULAR | Status: DC | PRN
  Administered 2020-01-20: 2 mg via INTRAVENOUS
  Filled 2020-01-19: qty 1

## 2020-01-19 NOTE — Progress Notes (Signed)
NAME:  [name], MRN:  [id number], DOB:  1956-05-13, LOS: 2 ADMISSION DATE:  01/17/2020, CONSULTATION DATE: 23/21 REFERRING MD: Dr. Stark Jock, CHIEF COMPLAINT: Sepsis  Brief History   64 year old female presented with generalized complaints of not feeling well with associated nausea, headache, and chills,work-up on admission revealed sepsis due with unknown etiology.  History of present illness   [name] is a 64 year old female with a past medical history significant for type 2 diabetes, migraines, hypertension, alcohol abuse, pancreatitis, and lung cancer status post resection who presented to the emergency department with general feeling unwell. She reports 2 days prior to admission she began feeling unwell with nausea and headache but denies any vomiting or diarrhea. She reports no sick contact. She denies any fever or chills that began 2 days prior. Denies any chest pain, congestion, urinary symptoms or lower extremity edema. On my assessment patient denied any cough or congestion. Patient reported 1 week history of cough prior to admission to ED provider.  Work-up on admission revealed vital signs significant for hypothermia, mild tachypnea and hypertension.Lab work significant multiple abnormalities including hyponatremia, hyperkalemia K6.2, severely reducedC02 <7, hyperglycemia, elevated creatinine of 3.13 elevated lipase 66, elevated high-sensitivity troponin of 151, hypocalcemia protein corrected Ca 7.6,WBC 27.7, hemoglobin 15.9, and platelet count 1370. Vital signs and lab work consistent with septic picture etiology unknown at this time.  Of note patienthas a history of ingesting 3-4 bottle of hairspray in 04/2013 but she denies any intentional ingestion of illicit drugs or chemicals in order to become intoxicated. She states she continues to drink a glass of wine at night but has not drink to intoxication in several years. Past Medical History  Hypertension    Type 2 diabetes History of lung cancer status post resection Anxiety Alcohol abuse Headache  Significant Hospital Events   Admitted 4/23  Consults:  Neruology  Procedures:  04/23 HD Cath- 04/25  Significant Diagnostic Tests:  4/23 CXR > 1. Opacity in the medial right lung base could represent confluence of shadows or developing infiltrate. Recommend clinical correlation and attention on follow-up.  4/23 US Renal> Few nonobstructing nephroliths in the upper pole and interpolar left kidney. No hydronephrosis. Grossly unremarkable appearance of the right kidney, limited visualization due to body habitus and bowel gas. Bladder is unremarkable.  4/23 CXR> 1. No complication after right internal jugular catheter placement. 2. Vascular congestion, with continued right basilar consolidation and small effusion. Micro Data:  COVID 4/23 > negative Blood culture 4/23 > NG @ 24 hours Urine culture 4/23> MRSA PCR 4/23: negative   Antimicrobials:  none  Interim history/subjective:  Patient is overall doing much better today.  She states that she continues to have some nausea.  She is also having some tremors and anxiety that started this morning.  Otherwise she denies any shortness of breath, chest pain, abdominal pain  Objective   Blood pressure (!) 148/118, pulse 94, temperature 98 F (36.7 C), temperature source Oral, resp. rate 20, height 5\' 8"  (1.727 m), weight 73.6 kg, SpO2 95 %.        Intake/Output Summary (Last 24 hours) at 01/19/2020 1029 Last data filed at 01/19/2020 0700 Gross per 24 hour  Intake 1630.56 ml  Output 1450 ml  Net 180.56 ml   Filed Weights   01/17/20 1444 01/18/20 0422 01/19/20 0500  Weight: 74.8 kg 76.8 kg 73.6 kg    Examination: General: Patient is lying in bed and does not appear to be in distress HEENT:[place]/AT, MM pink/dry,  PERRL,  Neuro:Alert and oriented x3, non-focal, tremors and anxiety  CV: s1s2 regular rate and rhythm, no  murmur, rubs, or gallops,  PULM:Faint expiratory wheeze right greater then left, no increased work of breathing, no accessory muscle use GI: soft, bowel soundshypoactive in all 4 quadrants, non-tenderto palpation of all quadrants, non-distended Extremities: warm/dry,noedema  Skin: no rashes or lesions   Resolved Hospital Problem list   Metabolic acidosis Shock Assessment & Plan:   Metabolic acidosis with elevated anion gap 2/2 to ETOH entoxication vs ethylene glycol poisoning ETOH withdrawal - Patient is metabolic acidosis secondary to EtOH intoxication has presents resolved. - She is having symptoms of EtOH withdrawal such as anxiety, tremor, diaphoresis, nausea.  Start [place] with Ativan today.  Multiple significant electrolyte abnormalities  -Monitor electrolytes and replete as needed.  Acute Kidney Injury  -Patient's kidney function is improving and creatinine is at 1.5 today.  We will continue to monitor as it trends downward. -We will pull out the HD catheter today as she no longer at risk for emergent hemodialysis  Type 2 diabetes  -Continue sliding scale insulin   Best practice:  Diet: Carb modified Pain/Anxiety/Delirium protocol (if indicated): CIWA with Ativan VAP protocol (if indicated): None DVT prophylaxis: Heparin GI prophylaxis: PPI Glucose control: SSI Mobility: Up with assistance Code Status: Full code Family Communication: Spoke with husband at bedside Disposition: Transfer to med telemetry today.  Labs   CBC: Recent Labs  Lab 01/17/20 1221 01/17/20 2327 01/18/20 0415  WBC 27.7*  --  12.6*  NEUTROABS 23.9*  --   --   HGB 15.9* 15.0 14.4  HCT 51.3* 44.0 42.1  MCV 103.0*  --  92.1  PLT 130*  --  128*    Basic Metabolic Panel: Recent Labs  Lab 01/17/20 1600 01/17/20 1930 01/17/20 2307 01/17/20 2307 01/17/20 2327 01/18/20 0415 01/18/20 0955 01/18/20 2138 01/19/20 0431  NA 134*   < > 133*   < > 131* 135 138 140 141  K 4.9   < >  4.9   < > 4.8 3.7 3.4* 2.9* 2.8*  CL 109   < > 102  --   --  102 101 98 100  CO2 <7*   < > 9*  --   --  14* 21* 28 28  GLUCOSE 258*   < > 328*  --   --  305* 277* 249* 156*  BUN 51*   < > 50*  --   --  48* 42* 29* 27*  CREATININE 2.80*   < > 2.65*  --   --  2.37* 2.13* 1.69* 1.51*  CALCIUM 6.1*   < > 6.7*  --   --  8.3* 7.8* 7.6* 7.8*  MG 1.8  --   --   --   --  1.6*  --   --   --   PHOS  --   --   --   --   --  3.9  --   --   --    < > = values in this interval not displayed.   GFR: Estimated Creatinine Clearance: 37.5 mL/min (A) (by C-G formula based on SCr of 1.51 mg/dL (H)). Recent Labs  Lab 01/17/20 1221 01/17/20 1415 01/17/20 1600 01/17/20 1740 01/18/20 0415 01/18/20 0740  PROCALCITON  --   --  1.31  --   --   --   WBC 27.7*  --   --   --  12.6*  --   LATICACIDVEN  --  5.5* 4.4* 4.1*  --  1.5    Liver Function Tests: Recent Labs  Lab 01/17/20 1221  AST 50*  ALT 23  ALKPHOS 125  BILITOT 0.5  PROT 6.8  ALBUMIN 3.7   Recent Labs  Lab 01/17/20 1221  LIPASE 66*   No results for input(s): AMMONIA in the last 168 hours.  ABG    Component Value Date/Time   PHART 7.302 (L) 01/17/2020 2327   PCO2ART 18.0 (LL) 01/17/2020 2327   PO2ART 86 01/17/2020 2327   HCO3 8.9 (L) 01/17/2020 2327   TCO2 9 (L) 01/17/2020 2327   ACIDBASEDEF 15.0 (H) 01/17/2020 2327   O2SAT 96.0 01/17/2020 2327     Coagulation Profile: Recent Labs  Lab 01/17/20 1415  INR 1.7*    Cardiac Enzymes: Recent Labs  Lab 01/17/20 1600  CKTOTAL 1,622*  CKMB 96.1*    HbA1C: Hgb A1c MFr Bld  Date/Time Value Ref Range Status  04/11/2012 07:00 PM 14.5 (H) <5.7 % Final    Comment:    (NOTE)                                                                       According to the ADA Clinical Practice Recommendations for 2011, when HbA1c is used as a screening test:  >=6.5%   Diagnostic of Diabetes Mellitus           (if abnormal result is confirmed) 5.7-6.4%   Increased risk of developing  Diabetes Mellitus References:Diagnosis and Classification of Diabetes Mellitus,Diabetes [id number] 1):S62-S69 and Standards of Medical Care in         Diabetes - 2011,Diabetes [id number] (Suppl 1):S11-S61.    CBG: Recent Labs  Lab 01/18/20 1645 01/18/20 2006 01/18/20 2336 01/19/20 0338 01/19/20 0751  GLUCAP 261* 226* 189* 137* 188*    Review of Systems:   Patient admits to nausea, tremor, anxiety, diaphoresis.  She denies hallucination.  Past Medical History  She,  has a past medical history of Alcohol abuse, Anxiety, Cancer ([place]), Diabetes mellitus, Headache(784.0), Hypertension, and Pancreatitis, acute.   Surgical History    Past Surgical History:  Procedure Laterality Date  . APPENDECTOMY    . CESAREAN SECTION    . JOINT REPLACEMENT     rt shoulder  . LUNG LOBECTOMY    . ORIF HUMERUS FRACTURE  04/13/2012   Procedure: OPEN REDUCTION INTERNAL FIXATION (ORIF) PROXIMAL HUMERUS FRACTURE;  Surgeon: Augustin Schooling, MD;  Location: [facility];  Service: Orthopedics;  Laterality: Left;  open reduction internal fixation left proximal humerus fracture  . SHOULDER SURGERY       Social History   reports that she has never smoked. She has never used smokeless tobacco. She reports current alcohol use. She reports that she does not use drugs.   Family History   Her family history includes COPD in her father; Diabetes in her father; Hypertension in her mother.   Allergies Allergies  Allergen Reactions  . Ciprofloxacin Diarrhea     Home Medications  Prior to Admission medications   Medication Sig Start Date End Date Taking? Authorizing Provider  atorvastatin (LIPITOR) 10 MG tablet Take 10 mg by mouth daily.  04/18/16  Yes [provider]  busPIRone (BUSPAR) 10 MG tablet Take 20  mg by mouth 2 (two) times daily.   Yes [provider]  Candesartan Cilexetil-HCTZ 32-25 MG TABS Take 1 tablet by mouth every morning.   Yes [provider]  diltiazem  (CARDIZEM CD) 180 MG 24 hr capsule Take 180 mg by mouth daily.   Yes [provider]  escitalopram (LEXAPRO) 20 MG tablet Take 20 mg by mouth daily.   Yes [provider]  insulin aspart (NOVOLOG FLEXPEN) 100 UNIT/ML FlexPen Inject 0-10 Units into the skin 3 (three) times daily as needed for high blood sugar.   Yes [provider]  metFORMIN (GLUCOPHAGE-XR) 500 MG 24 hr tablet Take 500 mg by mouth daily with breakfast.  03/31/16  Yes [provider]  pramipexole (MIRAPEX) 0.5 MG tablet Take 0.5 mg by mouth at bedtime.  04/11/16  Yes [provider]  TOUJEO SOLOSTAR 300 UNIT/ML Solostar Pen Inject 24 Units into the skin daily.  11/18/19  Yes [provider]  traMADol (ULTRAM) 50 MG tablet Take 50 mg by mouth every 6 (six) hours as needed for moderate pain.  03/12/16  Yes [provider]  traZODone (DESYREL) 150 MG tablet Take 150 mg by mouth at bedtime. 11/11/19  Yes [provider]  vitamin B-12 (CYANOCOBALAMIN) 1000 MCG tablet Take 1,000 mcg by mouth daily.   Yes [provider]  vitamin C (ASCORBIC ACID) 500 MG tablet Take 500 mg by mouth daily.   Yes [provider]  insulin detemir (LEVEMIR FLEXPEN) 100 UNIT/ML injection Inject 15 Units into the skin daily. 04/16/12 05/01/13  Elvera Lennox, MD  Insulin Pen Needle 29G X 12MM MISC Dispense insulin pen needles 04/16/12   Marye Round, Sorin C, MD  metFORMIN (GLUCOPHAGE) 500 MG tablet Take 1 tablet (500 mg total) by mouth 2 (two) times daily with a meal. 04/16/12 05/01/13  Elvera Lennox, MD     Critical care time:     Marianna Payment, D.O. [facility], PGY-1 Pager: [phone], Phone: [phone] Date 01/19/2020 Time 10:41 AM

## 2020-01-19 NOTE — Progress Notes (Signed)
eLink Physician-Brief Progress Note Patient Name: [name] DOB: [birth year] MRN: [id number]   Date of Service  01/19/2020  HPI/Events of Note  K 2.8. Cr elevated but downtrending. Not taking much PO intake and now off fluids due to bicarb drip being off.   eICU Interventions  88meq KCl IV ordered, as well as LR @ 75cc/hr.     Intervention Category Major Interventions: Electrolyte abnormality - evaluation and management  Charlott Rakes 01/19/2020, 5:57 AM

## 2020-01-19 NOTE — Evaluation (Signed)
Physical Therapy Evaluation Patient Details Name: [name] MRN: [id number] DOB: 06-14-1956 Today's Date: 01/19/2020   History of Present Illness  [name] is a 64 year old female with a past medical history significant for type 2 diabetes, migraines, hypertension, alcohol abuse, pancreatitis, and lung cancer status post resection who presented with generalized complaints of not feeling well with associated nausea, headache, and chills, work-up on admission revealed sepsis.  Clinical Impression  Patient presents with decreased mobility due to generalized weakness, decreased balance, decreased safety awareness and decreased activity tolerance.  Currently min to mod A for mobility up to recliner.  She sleepy from Ativan and shaky from withdrawal.  Feel she will progress well during inpatient stay with continued skilled PT and able to transition home at d/c with follow up [place] and spouse assist.      Follow Up Recommendations Home health PT;Supervision/Assistance - 24 hour    Equipment Recommendations  None recommended by PT    Recommendations for Other Services       Precautions / Restrictions Precautions Precautions: Fall      Mobility  Bed Mobility Overal bed mobility: Needs Assistance Bed Mobility: Supine to Sit;Rolling Rolling: Min assist   Supine to sit: HOB elevated;Min assist;Mod assist     General bed mobility comments: rolling initially to clean due to diarrhea; min-mod A to lift trunk and scoot forward  Transfers Overall transfer level: Needs assistance Equipment used: 2 person hand held assist Transfers: Sit to/from Stand;Stand Pivot Transfers Sit to Stand: +2 safety/equipment;Min assist Stand pivot transfers: Min assist;+2 safety/equipment       General transfer comment: assist for safety, lines and due to shaky up on her feet, pivot to chair only due to lethargic from Ativan  Ambulation/Gait                Stairs             Wheelchair Mobility    Modified Rankin (Stroke Patients Only)       Balance Overall balance assessment: Needs assistance Sitting-balance support: Feet supported Sitting balance-Leahy Scale: Fair     Standing balance support: Bilateral upper extremity supported;During functional activity Standing balance-Leahy Scale: Poor Standing balance comment: shaky up on her feet, assist with bilateral HHA for stand step to recliner                             Pertinent Vitals/Pain Pain Assessment: No/denies pain    Home Living Family/patient expects to be discharged to:: Private residence Living Arrangements: Spouse/significant other Available Help at Discharge: Family;Available PRN/intermittently Type of Home: House Home Access: Stairs to enter   [employer] of Steps: 2 Home Layout: One level Home Equipment: Shower seat - built in;Hand held Tourist information centre manager - 2 wheels      Prior Function Level of Independence: Independent               Hand Dominance        Extremity/Trunk Assessment   Upper Extremity Assessment Upper Extremity Assessment: Generalized weakness    Lower Extremity Assessment Lower Extremity Assessment: Generalized weakness    Cervical / Trunk Assessment Cervical / Trunk Assessment: Kyphotic  Communication   Communication: No difficulties  Cognition Arousal/Alertness: Awake/alert;Lethargic;Suspect due to medications(awake, but easily falls asleep, had Ativan) Behavior During Therapy: WFL for tasks assessed/performed Overall Cognitive Status: Within Functional Limits for tasks assessed(not formally assessed due to falling asleep easily,)  General Comments: knew she was laying in stool, reports having diarrhea due to antibiotic      General Comments      Exercises     Assessment/Plan    PT Assessment Patient needs continued PT services  PT Problem List Decreased  strength;Decreased activity tolerance;Decreased balance;Decreased knowledge of use of DME;Decreased safety awareness;Decreased mobility       PT Treatment Interventions DME instruction;Stair training;Therapeutic activities;Balance training;Therapeutic exercise;Functional mobility training;Gait training;Patient/family education    PT Goals (Current goals can be found in the Care Plan section)  Acute Rehab PT Goals Patient Stated Goal: to get stronger PT Goal Formulation: With patient/family Time For Goal Achievement: 02/02/20 Potential to Achieve Goals: Good    Frequency Min 3X/week   Barriers to discharge        Co-evaluation               AM-PAC PT "6 Clicks" Mobility  Outcome Measure Help needed turning from your back to your side while in a flat bed without using bedrails?: A Little Help needed moving from lying on your back to sitting on the side of a flat bed without using bedrails?: A Lot Help needed moving to and from a bed to a chair (including a wheelchair)?: A Little Help needed standing up from a chair using your arms (e.g., wheelchair or bedside chair)?: A Little Help needed to walk in hospital room?: A Lot Help needed climbing 3-5 steps with a railing? : Total 6 Click Score: 14    End of Session   Activity Tolerance: Patient tolerated treatment well Patient left: in chair;with call bell/phone within reach;with family/visitor present Nurse Communication: Mobility status PT Visit Diagnosis: Other abnormalities of gait and mobility (R26.89);Muscle weakness (generalized) (M62.81)    Time: [id number] PT Time Calculation (min) (ACUTE ONLY): 34 min   Charges:   PT Evaluation $PT Eval Moderate Complexity: 1 Mod PT Treatments $Therapeutic Activity: 8-22 mins        Magda Kiel, [place] [facility] [phone] 01/19/2020   Reginia Naas 01/19/2020, 12:47 PM

## 2020-01-19 NOTE — Progress Notes (Signed)
Pt improved throughout the day, anxiety and nausea mitigated by zofran and ativan; diarrhea lessened after IV abx d/c'd.  Received 4 runs of IV KCL and 1 run Mg.   Husband and dtr visited.   Advised dtr Coralee Pesa [phone]) of new room number. Report called to Iris, RN on 2W bed 20.

## 2020-01-19 NOTE — Progress Notes (Signed)
Patient will be transferred to med-tele floor. [facility] will take over care of this patient starting tomorrow morning. The [facility] physician has been notified.   Marianna Payment, D.O. [facility], PGY-1 Pager: [phone], Phone: [phone] Date 01/19/2020 Time 2:34 PM

## 2020-01-19 NOTE — Progress Notes (Signed)
Admit: 01/17/2020 LOS: 2  [name] with AKI and severe metabolic acidosis  Subjective:  . Stable overnight . SCr furthe rimproved, stable HCO3 off NaHCO3 gtt . Volatiles and ethylene glycol negative . Pt with some nausea this AM  04/24 0701 - 04/25 0700 In: 1979.3 [P.O.:150; I.V.:1625.5; IV Piggyback:203.8] Out: 1850 [Urine:1850]  Filed Weights   01/17/20 1444 01/18/20 0422 01/19/20 0500  Weight: 74.8 kg 76.8 kg 73.6 kg    Scheduled Meds: . chlorhexidine  15 mL Mouth Rinse BID  . Chlorhexidine Gluconate Cloth  6 each Topical Daily  . [START ON [id number] folic acid  1 mg Oral Daily  . folic acid  1 mg Intravenous Once  . heparin  5,000 Units Subcutaneous Q8H  . insulin aspart  0-24 Units Subcutaneous Q4H  . mouth rinse  15 mL Mouth Rinse q12n4p  . multivitamin with minerals  1 tablet Oral Daily  . pantoprazole (PROTONIX) IV  40 mg Intravenous QHS  . potassium chloride  20 mEq Oral TID  . sodium chloride flush  10-40 mL Intracatheter Q12H  . thiamine  100 mg Oral Daily   Or  . thiamine  100 mg Intravenous Daily   Continuous Infusions: . sodium chloride 10 mL/hr at 01/18/20 1800  . lactated ringers 75 mL/hr at 01/19/20 0607  . magnesium sulfate bolus IVPB 2 g (01/19/20 0905)  . potassium chloride 10 mEq (01/19/20 0907)   PRN Meds:.sodium chloride, albuterol, docusate sodium, heparin sodium (porcine), LORazepam **OR** LORazepam, ondansetron (ZOFRAN) IV, polyethylene glycol, sodium chloride flush  Current Labs: reviewed    Physical Exam:  Blood pressure (!) 148/118, pulse 94, temperature 98 F (36.7 C), temperature source Oral, resp. rate 20, height 5\' 8"  (1.727 m), weight 73.6 kg, SpO2 95 %. chornically ill appearing, NAD Tachy regular, nls1s2 Inc RR, ctab No sig LEE No rashes/lesions nonfocal R IJ Temp HD cath  A 1. Inc AG metabolic acidosis: not overtly in shock, lactate not that high, urine ketones negative and serum glucose ok.  No clear explanation for such a  severe acidosis. Osmolal gap not up. Methanol and EtGlycol neg. ? Lactate and ketones + renal failure?? Not sure.  Clealry better.  GFR recovered. 2. AKI, nonoliguric, improving 3. Hyperkalemia, 2/2 #1, #2, ?ARB use if takign at home; resolved 4. DM2 on metformin, not on SGLT2inh, metformin held 5. Hx/o alcohol use 6. Hx/o intentional ingestion of hair spray 2014 7. anxiety   P . DC HD cath today . No further suggestions . Outpt neph f/u not immediately necessary, can see PCP . Will s/o at this time. Call with questions or concerns   Pearson Grippe MD 01/19/2020, 9:54 AM  Recent Labs  Lab 01/18/20 0415 01/18/20 0415 01/18/20 0955 01/18/20 2138 01/19/20 0431  NA 135   < > 138 140 141  K 3.7   < > 3.4* 2.9* 2.8*  CL 102   < > 101 98 100  CO2 14*   < > 21* 28 28  GLUCOSE 305*   < > 277* 249* 156*  BUN 48*   < > 42* 29* 27*  CREATININE 2.37*   < > 2.13* 1.69* 1.51*  CALCIUM 8.3*   < > 7.8* 7.6* 7.8*  PHOS 3.9  --   --   --   --    < > = values in this interval not displayed.   Recent Labs  Lab 01/17/20 1221 01/17/20 2327 01/18/20 0415  WBC 27.7*  --  12.6*  NEUTROABS  23.9*  --   --   HGB 15.9* 15.0 14.4  HCT 51.3* 44.0 42.1  MCV 103.0*  --  92.1  PLT 130*  --  128*

## 2020-01-20 LAB — BASIC METABOLIC PANEL
Anion gap: 7 (ref 5–15)
BUN: 22 mg/dL (ref 8–23)
CO2: 29 mmol/L (ref 22–32)
Calcium: 7.8 mg/dL — ABNORMAL LOW (ref 8.9–10.3)
Chloride: 102 mmol/L (ref 98–111)
Creatinine, Ser: 1.15 mg/dL — ABNORMAL HIGH (ref 0.44–1.00)
GFR calc Af Amer: 58 mL/min — ABNORMAL LOW (ref >60)
GFR calc non Af Amer: 50 mL/min — ABNORMAL LOW (ref >60)
Glucose, Bld: 167 mg/dL — ABNORMAL HIGH (ref 70–99)
Potassium: 3.6 mmol/L (ref 3.5–5.1)
Sodium: 138 mmol/L (ref 135–145)

## 2020-01-20 LAB — URINE CULTURE: Culture: 30000 — AB

## 2020-01-20 LAB — GLUCOSE, CAPILLARY
Glucose-Capillary: 133 mg/dL — ABNORMAL HIGH (ref 70–99)
Glucose-Capillary: 159 mg/dL — ABNORMAL HIGH (ref 70–99)
Glucose-Capillary: 171 mg/dL — ABNORMAL HIGH (ref 70–99)
Glucose-Capillary: 189 mg/dL — ABNORMAL HIGH (ref 70–99)
Glucose-Capillary: 191 mg/dL — ABNORMAL HIGH (ref 70–99)
Glucose-Capillary: 193 mg/dL — ABNORMAL HIGH (ref 70–99)
Glucose-Capillary: 238 mg/dL — ABNORMAL HIGH (ref 70–99)
Glucose-Capillary: 273 mg/dL — ABNORMAL HIGH (ref 70–99)

## 2020-01-20 LAB — HEMOGLOBIN A1C
Hgb A1c MFr Bld: 7.5 % — ABNORMAL HIGH (ref 4.8–5.6)
Mean Plasma Glucose: 168.55 mg/dL

## 2020-01-20 LAB — MAGNESIUM: Magnesium: 2.3 mg/dL (ref 1.7–2.4)

## 2020-01-20 LAB — PHOSPHORUS: Phosphorus: 1 mg/dL — CL (ref 2.5–4.6)

## 2020-01-20 MED ORDER — INSULIN ASPART 100 UNIT/ML ~~LOC~~ SOLN
0.0000 [IU] | Freq: Three times a day (TID) | SUBCUTANEOUS | Status: DC
  Administered 2020-01-20: 5 [IU] via SUBCUTANEOUS
  Administered 2020-01-20 – 2020-01-21 (×2): 3 [IU] via SUBCUTANEOUS

## 2020-01-20 MED ORDER — INSULIN GLARGINE 100 UNIT/ML ~~LOC~~ SOLN
10.0000 [IU] | Freq: Every day | SUBCUTANEOUS | Status: DC
  Administered 2020-01-20 – 2020-01-21 (×2): 10 [IU] via SUBCUTANEOUS
  Filled 2020-01-20 (×2): qty 0.1

## 2020-01-20 MED ORDER — ENSURE ENLIVE PO LIQD
237.0000 mL | Freq: Two times a day (BID) | ORAL | Status: DC
  Administered 2020-01-20: 237 mL via ORAL

## 2020-01-20 MED ORDER — ACETAMINOPHEN 500 MG PO TABS
1000.0000 mg | ORAL_TABLET | Freq: Three times a day (TID) | ORAL | Status: DC | PRN
  Administered 2020-01-20 – 2020-01-21 (×3): 1000 mg via ORAL
  Filled 2020-01-20 (×3): qty 2

## 2020-01-20 MED ORDER — POTASSIUM PHOSPHATES 15 MMOLE/5ML IV SOLN
30.0000 mmol | Freq: Once | INTRAVENOUS | Status: AC
  Administered 2020-01-20: 30 mmol via INTRAVENOUS
  Filled 2020-01-20: qty 10

## 2020-01-20 MED ORDER — INSULIN ASPART 100 UNIT/ML ~~LOC~~ SOLN: 0.0000 [IU] | Freq: Every day | SUBCUTANEOUS | Status: DC

## 2020-01-20 NOTE — Progress Notes (Signed)
PROGRESS NOTE  [name]  DOB: 1956/04/28  PCP: Briscoe Deutscher, MD [id number]  DOA: 01/17/2020  LOS: 3 days   Chief Complaint  Patient presents with  . Nausea  . Emesis   Brief narrative: [name] is a 64 year old female with PMH significant for chronic alcohol abuse, history of self-harm by ingestion of hairspray in 2014, insulin-dependent DM, hypertension, lung cancer status post resection, migraine, anxiety disorder.  Patient was brought to the ED from home on 4/23 for generalized weakness and feeling unwell.    In the ED, she was found to have profound metabolic derangements including AKI Cr 3.13, hyperkalemia K 6.2, serum bicarb less than 7, lactic acid 5.5 She was suspected to have ingested a toxic chemical which she denied however.  She was admitted to ICU for severe metabolic acidosis, AKI and a suspected toxic ingestion Nephrology consultation was obtained. Patient was started on fomepizole, bicarbonate drip.  HD catheter was placed. With bicarbonate drip, subsequent labs showed improvement in potassium, serum bicarb and creatinine level.  She did not require dialysis.  Methanol and Ethyl glycol level sent on admission came negative With significant clinical improvement, patient was transferred to hospital service on 4/25  Subjective: Patient was seen and examined this morning.  Patient elderly Caucasian female.  Lying down in bed.  Not in distress.  Feels/looks depressed Having loose bowel movements.  Last one this morning.  Assessment/Plan: Severe metabolic derangements Severe metabolic acidosis, acute kidney injury, lactic acidosis -On presentation, creatinine was 3.13, potassium was 6.2, bicarbonate less than 7, lactic acid 5.5. -She was suspected to have ingested a toxic chemical which she denied however.  -She was admitted to ICU for severe metabolic acidosis, AKI and a suspected toxic ingestion -Nephrology consultation was obtained. -Patient was  started on fomepizole, bicarbonate drip.  HD catheter was placed. -With bicarbonate drip, subsequent labs showed improvement in potassium, serum bicarb and creatinine level.  She did not require dialysis.   -Methanol and Ethyl glycol level sent on admission came negative -Unclear etiology of what led to her metabolic derangements. -Last set of lab work from this morning shows potassium normal at 3.6, bicarbonate 29, creatinine 1.15.   -Currently on Ringer's lactate at 75 mL/h. -Continue to monitor electrolytes.  Hypophosphatemia -Phosphorus level significantly low at 1 today.  Ordered for 30 mmol of potassium phosphate IV replacement.  Chronic alcohol abuse Concern for EtOH withdrawal -On as needed Ativan per CIWA protocol. -Counseled to quit alcohol.  Sepsis - ruled out.  -Initial suspicion of sepsis at presentation -Eventually ruled out.  No growth on culture.  Empiric antibiotics stopped.  Diarrhea -Patient reports intermittent episodes of diarrhea with Metformin.  Currently off Metformin. -Monitor diarrhea frequency and amount.  Uncontrolled DM2 -A1c 7.5 on 4/26 -Per med list, Home meds include Toujeo 24 units daily premeal Humalog, and metformin. -Currently blood sugar level is acceptable only on sliding scale insulin every 4 hours. -Currently clear liquid diet.  Patient is alert and awake enough to eat.  Advance to medium carb/cardiac diet. -Resume Toujeo, Change sliding scale to premeal. -Target A1c less than 7.  Hypertension -Home med list include Candesartan, HCTZ, Cardizem -Currently all of these meds are on hold and blood pressure and heart rate remained under control. -Patient denies any history of A. fib arrhythmia.  Hyperlipidemia -Continue Lipitor  Anxiety disorder ?  Other psych disorders -On hold are buspirone, Lexapro, trazodone, tramadol and and pramipexole -Currently getting as needed Ativan per CIWA protocol.  Mobility:  PT eval ordered. Code Status:   Full code  DVT prophylaxis:  Lovenox subcu Antimicrobials:  Stopped Fluid: LR at 75 Diet: Medium carb/cardiac  Consultants: Critical care, nephrology Family Communication: called and left a voice mail to patient's daughter Ms. [name] [phone]  Status is: Inpatient  Remains inpatient appropriate because:IV treatments appropriate due to intensity of illness or inability to take PO  Dispo: The patient is from: Home              Anticipated d/c is to: Home              Anticipated d/c date is: 2 days              Patient currently is not medically stable to d/c.   Antimicrobials: Anti-infectives (From admission, onward)   Start     Dose/Rate Route Frequency Ordered Stop   01/18/20 1300  piperacillin-tazobactam (ZOSYN) IVPB 3.375 g  Status:  Discontinued     3.375 g 12.5 mL/hr over 240 Minutes Intravenous Every 8 hours 01/18/20 1031 01/19/20 0834   01/17/20 2100  piperacillin-tazobactam (ZOSYN) IVPB 2.25 g  Status:  Discontinued     2.25 g 100 mL/hr over 30 Minutes Intravenous Every 6 hours 01/17/20 1516 01/18/20 1031   01/17/20 1515  vancomycin variable dose per unstable renal function (pharmacist dosing)  Status:  Discontinued      Does not apply See admin instructions 01/17/20 1516 01/18/20 1013   01/17/20 1445  piperacillin-tazobactam (ZOSYN) IVPB 3.375 g     3.375 g 100 mL/hr over 30 Minutes Intravenous  Once 01/17/20 1440 01/17/20 1611   01/17/20 1345  vancomycin (VANCOCIN) IVPB 1000 mg/200 mL premix     1,000 mg 200 mL/hr over 60 Minutes Intravenous  Once 01/17/20 1343 01/17/20 1610   01/17/20 1345  piperacillin-tazobactam (ZOSYN) IVPB 3.375 g  Status:  Discontinued     3.375 g 12.5 mL/hr over 240 Minutes Intravenous  Once 01/17/20 1343 01/17/20 1838        Code Status: Full Code   Diet Order            Diet clear liquid Room service appropriate? Yes; Fluid consistency: Thin  Diet effective now              Infusions:  . sodium chloride Stopped  (01/19/20 1253)  . lactated ringers 75 mL/hr at 01/19/20 2220    Scheduled Meds: . chlorhexidine  15 mL Mouth Rinse BID  . Chlorhexidine Gluconate Cloth  6 each Topical Daily  . folic acid  1 mg Oral Daily  . heparin  5,000 Units Subcutaneous Q8H  . insulin aspart  0-24 Units Subcutaneous Q4H  . mouth rinse  15 mL Mouth Rinse q12n4p  . multivitamin with minerals  1 tablet Oral Daily  . pantoprazole (PROTONIX) IV  40 mg Intravenous QHS  . potassium chloride  20 mEq Oral TID  . sodium chloride flush  10-40 mL Intracatheter Q12H  . thiamine  100 mg Oral Daily   Or  . thiamine  100 mg Intravenous Daily    PRN meds: sodium chloride, albuterol, docusate sodium, LORazepam **OR** LORazepam, ondansetron (ZOFRAN) IV, polyethylene glycol, sodium chloride flush   Objective: Vitals:   01/19/20 1734 01/19/20 2224  BP: (!) 141/78 (!) 136/91  Pulse: 86 77  Resp: 20 18  Temp: 98.6 F (37 C) 98.8 F (37.1 C)  SpO2: 100% 95%    Intake/Output Summary (Last 24 hours) at 01/20/2020 [id number] Last data filed  at 01/20/2020 0630 Gross per 24 hour  Intake 1955.67 ml  Output 900 ml  Net 1055.67 ml   Filed Weights   01/18/20 0422 01/19/20 0500 01/20/20 0500  Weight: 76.8 kg 73.6 kg 76.4 kg   Weight change: 2.8 kg Body mass index is 25.61 kg/m.   Physical Exam: General exam: Appears calm and comfortable.  Not in physical distress Skin: No rashes, lesions or ulcers. HEENT: Atraumatic, normocephalic, supple neck, no obvious bleeding Lungs: Clear to auscultation bilaterally CVS: Regular rate and rhythm, no murmur GI/Abd soft, nontender, nondistended, bowel sound present CNS: Alert, awake and oriented x3 Psychiatry: Sad affect. Extremities: No pedal edema, no calf tenderness  Data Review: I have personally reviewed the laboratory data and studies available.  Recent Labs  Lab 01/17/20 1221 01/17/20 2327 01/18/20 0415  WBC 27.7*  --  12.6*  NEUTROABS 23.9*  --   --   HGB 15.9* 15.0 14.4    HCT 51.3* 44.0 42.1  MCV 103.0*  --  92.1  PLT 130*  --  128*   Recent Labs  Lab 01/17/20 1600 01/17/20 1930 01/18/20 0415 01/18/20 0955 01/18/20 2138 01/19/20 0431 01/20/20 0540  NA 134*   < > 135 138 140 141 138  K 4.9   < > 3.7 3.4* 2.9* 2.8* 3.6  CL 109   < > 102 101 98 100 102  CO2 <7*   < > 14* 21* 28 28 29   GLUCOSE 258*   < > 305* 277* 249* 156* 167*  BUN 51*   < > 48* 42* 29* 27* 22  CREATININE 2.80*   < > 2.37* 2.13* 1.69* 1.51* 1.15*  CALCIUM 6.1*   < > 8.3* 7.8* 7.6* 7.8* 7.8*  MG 1.8  --  1.6*  --   --   --  2.3  PHOS  --   --  3.9  --   --   --  1.0*   < > = values in this interval not displayed.    Signed, Terrilee Croak, MD [facility] Pager: [phone] (Secure Chat preferred). 01/20/2020

## 2020-01-20 NOTE — Plan of Care (Signed)
  Problem: Education: Goal: Knowledge of General Education information will improve Description: Including pain rating scale, medication(s)/side effects and non-pharmacologic comfort measures Outcome: Progressing   Problem: Health Behavior/Discharge Planning: Goal: Ability to manage health-related needs will improve Outcome: Progressing  Patient able to discuss symptoms of withdrawal, states she has experienced withdrawal before.  Problem: Activity: Goal: Risk for activity intolerance will decrease Outcome: Progressing  Patient transferring to [facility] without C/o SOB.

## 2020-01-20 NOTE — Progress Notes (Signed)
Nutrition Follow-up  DOCUMENTATION CODES:   Not applicable  INTERVENTION:   - Continue MVI with minerals daily  - Ensure Enlive po BID, each supplement provides 350 kcal and 20 grams of protein  NUTRITION DIAGNOSIS:   Inadequate oral intake related to acute illness (sepsis) as evidenced by NPO status.  Progressing, pt now on Regular diet  GOAL:   Patient will meet greater than or equal to 90% of their needs  Progressing  MONITOR:   PO intake, Supplement acceptance, Labs, Weight trends, I & O's  REASON FOR ASSESSMENT:   Malnutrition Screening Tool    ASSESSMENT:   64 year old female with past medical history of DM2, migraines, HTN, alcohol abuse, pancreatitis, lung cancer s/p resection who presented with generalized complaints of feeling unwell and 2 day history of nausea and headache, work-up on admission revealed sepsis due to unknown etiology.  4/23 - central venous catheter placed 4/25 - HD cath d/c, transferred out of ICU  Pt currently on a Regular diet, advanced this AM. Noted ~90% completed clear liquid breakfast tray at bedside.  Spoke with pt who reports ongoing nausea. Pt states that she is trying to eat more and plans to continue trying throughout the day. Pt is willing to try to consume an oral nutrition supplement. Will order Ensure Enlive.  Pt denies any recent changes in weight.  Discussed the importance of adequate PO intake with pt.  Meal Completion: 10-30% x 2 recorded meals  Medications reviewed and include: folic acid, SSI, Lantus 10 units daily, MVI with minerals, Klor-con 20 mEq TID, thiamine, potassium phosphate 30 mmol once, LR @ 75 ml/hr  Labs reviewed: phosphorus 1.0 CBG's: 83-223 x 24 hours  UOP: 900 ml x 24 hours I/O': +6.0 L since admit  NUTRITION - FOCUSED PHYSICAL EXAM:    Most Recent Value  Orbital Region  No depletion  Upper Arm Region  No depletion  Thoracic and Lumbar Region  No depletion  Buccal Region  No depletion   Temple Region  No depletion  Clavicle Bone Region  Mild depletion  Clavicle and Acromion Bone Region  Mild depletion  Scapular Bone Region  No depletion  Dorsal Hand  No depletion  Patellar Region  Mild depletion  Anterior Thigh Region  Mild depletion  Posterior Calf Region  Mild depletion  Edema (RD Assessment)  None  Hair  Reviewed  Eyes  Reviewed  Mouth  Reviewed  Skin  Reviewed  Nails  Reviewed       Diet Order:   Diet Order            Diet regular Room service appropriate? Yes; Fluid consistency: Thin  Diet effective now              EDUCATION NEEDS:   Education needs have been addressed  Skin:  Skin Assessment: Reviewed RN Assessment  Last BM:  01/19/20 large type 7  Height:   Ht Readings from Last 1 Encounters:  01/17/20 5\' 8"  (1.727 m)    Weight:   Wt Readings from Last 1 Encounters:  01/20/20 76.4 kg    Ideal Body Weight:  63.6 kg  BMI:  Body mass index is 25.61 kg/m.  Estimated Nutritional Needs:   Kcal:  2000-2200  Protein:  100-110  Fluid:  >/= 2 L/day    Gaynell Face, MS, RD, LDN Inpatient Clinical Dietitian Pager: [phone] Weekend/After Hours: [phone]

## 2020-01-21 LAB — PHOSPHORUS: Phosphorus: 1.6 mg/dL — ABNORMAL LOW (ref 2.5–4.6)

## 2020-01-21 LAB — GLUCOSE, CAPILLARY
Glucose-Capillary: 157 mg/dL — ABNORMAL HIGH (ref 70–99)
Glucose-Capillary: 247 mg/dL — ABNORMAL HIGH (ref 70–99)

## 2020-01-21 LAB — BASIC METABOLIC PANEL
Anion gap: 5 (ref 5–15)
BUN: 15 mg/dL (ref 8–23)
CO2: 26 mmol/L (ref 22–32)
Calcium: 8 mg/dL — ABNORMAL LOW (ref 8.9–10.3)
Chloride: 107 mmol/L (ref 98–111)
Creatinine, Ser: 0.99 mg/dL (ref 0.44–1.00)
GFR calc Af Amer: >60 mL/min (ref >60)
GFR calc non Af Amer: 60 mL/min — ABNORMAL LOW (ref >60)
Glucose, Bld: 222 mg/dL — ABNORMAL HIGH (ref 70–99)
Potassium: 4.5 mmol/L (ref 3.5–5.1)
Sodium: 138 mmol/L (ref 135–145)

## 2020-01-21 LAB — MAGNESIUM: Magnesium: 1.8 mg/dL (ref 1.7–2.4)

## 2020-01-21 MED ORDER — CANDESARTAN CILEXETIL-HCTZ 32-25 MG PO TABS: 1.0000 | ORAL_TABLET | Freq: Every morning | ORAL | Status: DC

## 2020-01-21 MED ORDER — PANTOPRAZOLE SODIUM 40 MG PO TBEC: 40.0000 mg | DELAYED_RELEASE_TABLET | Freq: Every day | ORAL | Status: DC

## 2020-01-21 MED ORDER — ALPRAZOLAM 0.25 MG PO TABS: 0.2500 mg | ORAL_TABLET | Freq: Three times a day (TID) | ORAL | 0 refills | Status: AC | PRN

## 2020-01-21 MED ORDER — DILTIAZEM HCL ER COATED BEADS 180 MG PO CP24
180.0000 mg | ORAL_CAPSULE | Freq: Every day | ORAL | Status: DC
  Administered 2020-01-21: 180 mg via ORAL
  Filled 2020-01-21: qty 1

## 2020-01-21 MED ORDER — POTASSIUM & SODIUM PHOSPHATES 280-160-250 MG PO PACK
1.0000 | PACK | Freq: Once | ORAL | Status: AC
  Administered 2020-01-21: 1 via ORAL
  Filled 2020-01-21 (×2): qty 1

## 2020-01-21 MED ORDER — POTASSIUM PHOSPHATES 15 MMOLE/5ML IV SOLN
30.0000 mmol | Freq: Once | INTRAVENOUS | Status: DC
  Filled 2020-01-21: qty 10

## 2020-01-21 NOTE — Progress Notes (Signed)
Physical Therapy Treatment Patient Details Name: [name] MRN: [id number] DOB: [birth year] Today's Date: 01/21/2020    History of Present Illness [name] is a 64 year old female with a past medical history significant for type 2 diabetes, migraines, hypertension, alcohol abuse, pancreatitis, and lung cancer status post resection who presented with generalized complaints of not feeling well with associated nausea, headache, and chills, work-up on admission revealed sepsis.    PT Comments    Pt agreeable to OOB mobility. Pt continues to present with limited tolerance for activity and light assist for bed mobility, transfers, and short distance ambulation. Pt also with stool incontinence with mobility, requiring both pericare and transfer assist to [facility]. PT to continue to recommend HHPT with spousal assist as needed. PT to continue to follow acutely.    Follow Up Recommendations  Home health PT;Supervision/Assistance - 24 hour     Equipment Recommendations  None recommended by PT    Recommendations for Other Services       Precautions / Restrictions Precautions Precautions: Fall Precaution Comments: stool incontinence Restrictions Weight Bearing Restrictions: No    Mobility  Bed Mobility Overal bed mobility: Needs Assistance Bed Mobility: Rolling;Sidelying to Sit Rolling: Min guard Sidelying to sit: Min assist;HOB elevated       General bed mobility comments: min guard for rolling with increased time and use of bedrail, min assist for sidelying to sit for trunk elevation to upright and steadying.  Transfers Overall transfer level: Needs assistance Equipment used: Rolling walker (2 wheeled) Transfers: Sit to/from [facility] Sit to Stand: Min assist;From elevated surface Stand pivot transfers: Min assist;From elevated surface       General transfer comment: min assist for power up from EOB, steadying. Pt with stool incontinence upon standing,  required sit EOB to clean pt's LEs, so stood x2 from EOB and x1 from [facility]. Required assist to clean up and for pericare.  Ambulation/Gait Ambulation/Gait assistance: Min assist Gait Distance (Feet): 5 Feet Assistive device: Rolling walker (2 wheeled) Gait Pattern/deviations: Step-through pattern;Decreased stride length;Trunk flexed Gait velocity: decr   General Gait Details: min assist to steady, lines/leads, and pt/RW guiding.   Stairs             Wheelchair Mobility    Modified Rankin (Stroke Patients Only)       Balance Overall balance assessment: Needs assistance Sitting-balance support: Feet supported Sitting balance-Leahy Scale: Fair     Standing balance support: Bilateral upper extremity supported;During functional activity Standing balance-Leahy Scale: Poor Standing balance comment: reliant on external support in standing                            Cognition Arousal/Alertness: Awake/alert Behavior During Therapy: WFL for tasks assessed/performed;Anxious Overall Cognitive Status: Within Functional Limits for tasks assessed(not formally assessed due to falling asleep easily,)                                 General Comments: anxiety with mobility and stool incontinence, requesting ativan      Exercises General Exercises - Lower Extremity Long Arc Quad: AROM;Both;10 reps;Seated    General Comments General comments (skin integrity, edema, etc.): less tremulous this session vs last      Pertinent Vitals/Pain Pain Assessment: Faces Faces Pain Scale: No hurt Pain Intervention(s): Limited activity within patient's tolerance;Monitored during session    Home Living  Prior Function            PT Goals (current goals can now be found in the care plan section) Acute Rehab PT Goals Patient Stated Goal: to get stronger PT Goal Formulation: With patient/family Time For Goal Achievement:  02/02/20 Potential to Achieve Goals: Good Progress towards PT goals: Progressing toward goals    Frequency    Min 3X/week      PT Plan Current plan remains appropriate    Co-evaluation              AM-PAC PT "6 Clicks" Mobility   Outcome Measure  Help needed turning from your back to your side while in a flat bed without using bedrails?: A Little Help needed moving from lying on your back to sitting on the side of a flat bed without using bedrails?: A Little Help needed moving to and from a bed to a chair (including a wheelchair)?: A Little Help needed standing up from a chair using your arms (e.g., wheelchair or bedside chair)?: A Little Help needed to walk in hospital room?: A Little Help needed climbing 3-5 steps with a railing? : A Lot 6 Click Score: 17    End of Session Equipment Utilized During Treatment: Gait belt Activity Tolerance: Patient tolerated treatment well;Patient limited by fatigue Patient left: in chair;with call bell/phone within reach;with chair alarm set Nurse Communication: Mobility status PT Visit Diagnosis: Other abnormalities of gait and mobility (R26.89);Muscle weakness (generalized) (M62.81)     Time: [id number] PT Time Calculation (min) (ACUTE ONLY): 37 min  Charges:  $Therapeutic Activity: 8-22 mins $Self Care/Home Management: 8-22                     [name] E, PT Acute Rehabilitation Services Pager [phone]  Office [phone]    [name] D [name] 01/21/2020, 1:30 PM

## 2020-01-21 NOTE — Progress Notes (Signed)
VAST consulted to obtain IV access. Upon arrival at bedside, pt's nurse Tammy verbalized pt is being discharged today and no longer needs IV access; physician changing IV med to PO.

## 2020-01-21 NOTE — Progress Notes (Signed)
Discharge instructions reviewed with pt and her husband.  Copy of instructions given to pt, pt aware of script sent to her mail order pharmacy.  Pt d/c'd via wheelchair with belongings, with husband.         Escorted by unit NT.

## 2020-01-21 NOTE — Discharge Summary (Signed)
Physician Discharge Summary  [name] [id number] DOB: 03/16/1956 DOA: 01/17/2020  PCP: Briscoe Deutscher, MD  Admit date: 01/17/2020 Discharge date: 01/21/2020  Admitted From: Home Discharge disposition: Home   Code Status: Full Code  Diet Recommendation: Cardiac/diabetic diet   Recommendations for Outpatient Follow-Up:   1. Follow-up with PCP as an outpatient 2. Follow-up with psychiatry as an outpatient  Discharge Diagnosis:   Principal Problem:   Sepsis ([place]) Active Problems:   Dehydration   Acute kidney injury (AKI) with acute tubular necrosis (ATN) (HCC)   DM type 2 causing complication (HCC)   HTN (hypertension)   Alcohol abuse   Hx of cancer of lung   Metabolic acidosis   Ethylene glycol poisoning  History of Present Illness / Brief narrative:  [name] is a 64 year old female with PMH significant for chronic alcohol abuse, history of self-harm by ingestion of hairspray in 2014, insulin-dependent DM, hypertension, lung cancer status post resection, migraine, anxiety disorder.  Patient was brought to the ED from home on 4/23 for generalized weakness and feeling unwell.    In the ED, she was found to have profound metabolic derangements including AKI Cr 3.13, hyperkalemia K 6.2, serum bicarb less than 7, lactic acid 5.5 She was suspected to have ingested a toxic chemical which she denied however.  She was admitted to ICU for severe metabolic acidosis, AKI and a suspected toxic ingestion Nephrology consultation was obtained. Patient was started on fomepizole, bicarbonate drip.  HD catheter was placed. With bicarbonate drip, subsequent labs showed improvement in potassium, serum bicarb and creatinine level.  She did not require dialysis.  Methanol and Ethyl glycol level sent on admission came negative With significant clinical improvement, patient was transferred to hospital service on 4/25  Hospital Course:  Severe metabolic derangements Severe metabolic  acidosis, acute kidney injury, lactic acidosis -On presentation, creatinine was 3.13, potassium was 6.2, bicarbonate less than 7, lactic acid 5.5. -She was suspected to have ingested a toxic chemical which she denied however.  -She was admitted to ICU for severe metabolic acidosis, AKI and a suspected toxic ingestion -Nephrology consultation was obtained. -Patient was started on fomepizole, bicarbonate drip.  HD catheter was placed. -With bicarbonate drip, subsequent labs showed improvement in potassium, serum bicarb and creatinine level.  She did not require dialysis.   -Methanol and Ethyl glycol level sent on admission came negative -Unclear etiology of what led to her metabolic derangements. -Last set of lab work from this morning shows potassium normal at 4.5, bicarbonate 26, creatinine 0.99  -Continue to monitor electrolytes.  Hypophosphatemia -Phosphorus level significantly low at 1.6 today.  Replaced with 30 mmol of potassium phosphate IV   Chronic alcohol abuse Concern for EtOH withdrawal -used Ativan per CIWA protocol in the hospital. -Counseled to quit alcohol.  Sepsis - ruled out.  -Initial suspicion of sepsis at presentation -Eventually ruled out.  No growth on culture.  Empiric antibiotics stopped.  Diarrhea -Patient reports intermittent episodes of diarrhea with Metformin.  Patient states he started feeling better after the dose was lowered.  Uncontrolled DM2 -A1c 7.5 on 4/26 -Per med list, Home meds include Toujeo 24 units daily premeal Humalog, and metformin.  -Resume the same at home.  Hypertension -Home med list include Candesartan, HCTZ, Cardizem -All meds were kept on hold because of initial hypotension and electrolyte abnormalities.  Will resume all today. Continue same at home.  Hyperlipidemia -Continue Lipitor  Anxiety disorder ?  Other psych disorders -On hold are buspirone, Lexapro,  trazodone, tramadol and and pramipexole -Patient states he  is feeling better with Ativan in the hospital.  I wrote her a prescription for limited supply of Xanax.  She will continue to hold other medicines and follow-up with psychiatry as an outpatient.  Mobility: PT eval ordered. Code Status: Full code  Subjective:  Seen and examined this morning.  Elderly Caucasian female.  Sitting up in chair.  Not in distress.  Feels better.  Discharge Exam:   Vitals:   01/20/20 1615 01/20/20 2343 01/21/20 0300 01/21/20 0843  BP: 123/71 (!) 144/80  (!) 177/95  Pulse: 86 82  79  Resp: 20     Temp: 98.9 F (37.2 C) 97.8 F (36.6 C)  98.2 F (36.8 C)  TempSrc:      SpO2: 100% 99%  100%  Weight:   75.4 kg   Height:        Body mass index is 25.27 kg/m.  General exam: Appears calm and comfortable.  Skin: No rashes, lesions or ulcers. HEENT: Atraumatic, normocephalic, supple neck, no obvious bleeding Lungs: Clear to auscultation bilaterally CVS: Regular rate and rhythm, no murmur GI/Abd soft, nontender, nondistended, bowel sound present CNS: Alert, awake, oriented x3 Psychiatry: Depressed look Extremities: No pedal edema, no calf tenderness  Discharge Instructions:  Wound care: None Discharge Instructions    Diet - low sodium heart healthy   Complete by: As directed    Diet Carb Modified   Complete by: As directed    Increase activity slowly   Complete by: As directed      Follow-up Information    Briscoe Deutscher, MD Follow up.   Specialty: Family Medicine Contact information: [place] [address] [phone]          Allergies as of 01/21/2020      Reactions   Ciprofloxacin Diarrhea      Medication List    STOP taking these medications   busPIRone 10 MG tablet Commonly known as: BUSPAR   escitalopram 20 MG tablet Commonly known as: LEXAPRO   insulin detemir 100 UNIT/ML injection Commonly known as: Levemir FlexPen   pramipexole 0.5 MG tablet Commonly known as: MIRAPEX   traMADol 50 MG  tablet Commonly known as: ULTRAM   traZODone 150 MG tablet Commonly known as: DESYREL     TAKE these medications   ALPRAZolam 0.25 MG tablet Commonly known as: Xanax Take 1 tablet (0.25 mg total) by mouth 3 (three) times daily as needed for up to 5 days for anxiety.   atorvastatin 10 MG tablet Commonly known as: LIPITOR Take 10 mg by mouth daily.   Candesartan Cilexetil-HCTZ 32-25 MG Tabs Take 1 tablet by mouth every morning.   diltiazem 180 MG 24 hr capsule Commonly known as: CARDIZEM CD Take 180 mg by mouth daily.   Insulin Pen Needle 29G X 12MM Misc Dispense insulin pen needles   metFORMIN 500 MG 24 hr tablet Commonly known as: GLUCOPHAGE-XR Take 500 mg by mouth daily with breakfast. What changed: Another medication with the same name was removed. Continue taking this medication, and follow the directions you see here.   NovoLOG FlexPen 100 UNIT/ML FlexPen Generic drug: insulin aspart Inject 0-10 Units into the skin 3 (three) times daily as needed for high blood sugar.   Toujeo SoloStar 300 UNIT/ML Solostar Pen Generic drug: insulin glargine (1 Unit Dial) Inject 24 Units into the skin daily.   vitamin B-12 1000 MCG tablet Commonly known as: CYANOCOBALAMIN Take 1,000 mcg  by mouth daily.   vitamin C 500 MG tablet Commonly known as: ASCORBIC ACID Take 500 mg by mouth daily.       Time coordinating discharge: 35 minutes  The results of significant diagnostics from this hospitalization (including imaging, microbiology, ancillary and laboratory) are listed below for reference.    Procedures and Diagnostic Studies:   US RENAL  Result Date: 01/17/2020 CLINICAL DATA:  Acute kidney injury with ATN, sepsis EXAM: RENAL / URINARY TRACT ULTRASOUND COMPLETE COMPARISON:  Ultrasound 06/08/2005, CT 06/08/2005 FINDINGS: Right Kidney: Renal measurements: 10.1 x 4.8 x 5.6 cm = volume: 142 mL . Echogenicity within normal limits. No mass or hydronephrosis visualized. Difficult  visualization of the right kidney secondary to patient body habitus and bowel gas. Left Kidney: Renal measurements: 10.9 x 6.3 x 5.4 cm = volume: 194 mL. Few echogenic shadowing calculi are present in the upper and interpolar left kidney, largest measuring up to 7 mm in diameter. No hydronephrosis. No concerning renal mass. Bladder: Appears normal for degree of bladder distention. Other: None. IMPRESSION: Few nonobstructing nephroliths in the upper pole and interpolar left kidney. No hydronephrosis. Grossly unremarkable appearance of the right kidney, limited visualization due to body habitus and bowel gas. Bladder is unremarkable. Electronically Signed   By: Lovena Le M.D.   On: 01/17/2020 17:22   DG Chest Portable 1 View  Result Date: 01/17/2020 CLINICAL DATA:  Hemodialysis catheter placement EXAM: PORTABLE CHEST 1 VIEW COMPARISON:  01/17/2020 at 1:07 p.m. FINDINGS: Single frontal view of the chest is obtained, with the patient rotated toward the right. Right internal jugular catheter tip projects over superior vena cava. Cardiac silhouette is stable. There is chronic elevation of the right hemidiaphragm. There is right basilar consolidation and small right pleural effusion. No pneumothorax. Stable central vascular congestion. IMPRESSION: 1. No complication after right internal jugular catheter placement. 2. Vascular congestion, with continued right basilar consolidation and small effusion. Electronically Signed   By: Randa Ngo M.D.   On: 01/17/2020 19:28   DG Chest Port 1 View  Result Date: 01/17/2020 CLINICAL DATA:  Cough for a week.  Nausea vomiting. EXAM: PORTABLE CHEST 1 VIEW COMPARISON:  April 11, 2012 and May 01, 2013 FINDINGS: The left hilum is more prominent than the right but this is a stable finding. The heart, hila, and mediastinum are unremarkable. The left lung is clear. Elevation right hemidiaphragm is identified with postsurgical changes. Opacity in the medial right lung base is  identified. No other acute abnormalities. IMPRESSION: 1. Opacity in the medial right lung base could represent confluence of shadows or developing infiltrate. Recommend clinical correlation and attention on follow-up. Electronically Signed   By: Dorise Bullion III M.D   On: 01/17/2020 13:35     Labs:   Basic Metabolic Panel: Recent Labs  Lab 01/17/20 1600 01/17/20 1930 01/18/20 [id number] 01/18/20 [id number] 01/18/20 [id number] 01/18/20 0955 01/18/20 2138 01/18/20 2138 01/19/20 0431 01/19/20 0431 01/20/20 0540 01/21/20 0307  NA 134*   < > 135   < > 138  --  140  --  141  --  138 138  K 4.9   < > 3.7   < > 3.4*   < > 2.9*   < > 2.8*   < > 3.6 4.5  CL 109   < > 102   < > 101  --  98  --  100  --  102 107  CO2 <7*   < > 14*   < > 21*  --  28  --  28  --  29 26  GLUCOSE 258*   < > 305*   < > 277*  --  249*  --  156*  --  167* 222*  BUN 51*   < > 48*   < > 42*  --  29*  --  27*  --  22 15  CREATININE 2.80*   < > 2.37*   < > 2.13*  --  1.69*  --  1.51*  --  1.15* 0.99  CALCIUM 6.1*   < > 8.3*   < > 7.8*  --  7.6*  --  7.8*  --  7.8* 8.0*  MG 1.8  --  1.6*  --   --   --   --   --   --   --  2.3 1.8  PHOS  --   --  3.9  --   --   --   --   --   --   --  1.0* 1.6*   < > = values in this interval not displayed.   GFR Estimated Creatinine Clearance: 57.1 mL/min (by C-G formula based on SCr of 0.99 mg/dL). Liver Function Tests: Recent Labs  Lab 01/17/20 1221  AST 50*  ALT 23  ALKPHOS 125  BILITOT 0.5  PROT 6.8  ALBUMIN 3.7   Recent Labs  Lab 01/17/20 1221  LIPASE 66*   No results for input(s): AMMONIA in the last 168 hours. Coagulation profile Recent Labs  Lab 01/17/20 1415  INR 1.7*    CBC: Recent Labs  Lab 01/17/20 1221 01/17/20 2327 01/18/20 0415  WBC 27.7*  --  12.6*  NEUTROABS 23.9*  --   --   HGB 15.9* 15.0 14.4  HCT 51.3* 44.0 42.1  MCV 103.0*  --  92.1  PLT 130*  --  128*   Cardiac Enzymes: Recent Labs  Lab 01/17/20 1600  CKTOTAL 1,622*  CKMB 96.1*    BNP: Invalid input(s): POCBNP CBG: Recent Labs  Lab 01/20/20 1913 01/20/20 2208 01/20/20 2342 01/21/20 0841 01/21/20 1132  GLUCAP 191* 171* 193* 157* 247*   D-Dimer No results for input(s): DDIMER in the last 72 hours. Hgb A1c Recent Labs    01/20/20 0540  HGBA1C 7.5*   Lipid Profile No results for input(s): CHOL, HDL, LDLCALC, TRIG, CHOLHDL, LDLDIRECT in the last 72 hours. Thyroid function studies No results for input(s): TSH, T4TOTAL, T3FREE, THYROIDAB in the last 72 hours.  Invalid input(s): FREET3 Anemia work up No results for input(s): VITAMINB12, FOLATE, FERRITIN, TIBC, IRON, RETICCTPCT in the last 72 hours. Microbiology Recent Results (from the past 240 hour(s))  Blood culture (routine x 2)     Status: None (Preliminary result)   Collection Time: 01/17/20  1:42 PM   Specimen: BLOOD  Result Value Ref Range Status   Specimen Description   Final    BLOOD LEFT ANTECUBITAL Performed at [facility] [address], [place], [place] [address]    Special Requests   Final    BOTTLES DRAWN AEROBIC ONLY Blood Culture results may not be optimal due to an inadequate volume of blood received in culture bottles Performed at [facility] [address], [place], [place] [address]    Culture   Final    NO GROWTH 4 DAYS Performed at [facility], [place] [address], [place], [place] [address]    Report Status PENDING  Incomplete  Blood culture (routine x 2)  Status: None (Preliminary result)   Collection Time: 01/17/20  1:47 PM   Specimen: BLOOD  Result Value Ref Range Status   Specimen Description   Final    BLOOD RIGHT ANTECUBITAL Performed at [facility] [address], [place], [place] [address]    Special Requests   Final    BOTTLES DRAWN AEROBIC ONLY Blood Culture results may not be optimal due to an inadequate volume of blood received in culture bottles Performed at [facility] [address] [place], [place] [address]    Culture   Final    NO GROWTH 4 DAYS Performed at [facility], [place] [address], [place], [place] [address]    Report Status PENDING  Incomplete  Respiratory Panel by RT PCR (Flu A&B, Covid) - Nasopharyngeal Swab     Status: None   Collection Time: 01/17/20  2:47 PM   Specimen: Nasopharyngeal Swab  Result Value Ref Range Status   SARS Coronavirus 2 by RT PCR NEGATIVE NEGATIVE Final    Comment: (NOTE) SARS-CoV-2 target nucleic acids are NOT DETECTED. The SARS-CoV-2 RNA is generally detectable in upper respiratoy specimens during the acute phase of infection. The lowest concentration of SARS-CoV-2 viral copies this assay can detect is 131 copies/mL. A negative result does not preclude SARS-Cov-2 infection and should not be used as the sole basis for treatment or other patient management decisions. A negative result may occur with  improper specimen collection/handling, submission of specimen other than nasopharyngeal swab, presence of viral mutation(s) within the areas targeted by this assay, and inadequate number of viral copies (<131 copies/mL). A negative result must be combined with clinical observations, patient history, and epidemiological information. The expected result is Negative. Fact Sheet for Patients:  PinkCheek.be Fact Sheet for Healthcare Providers:  GravelBags.it This test is not yet ap proved or cleared by the [place] FDA and  has been authorized for detection and/or diagnosis of SARS-CoV-2 by FDA under an Emergency Use Authorization (EUA). This EUA will remain  in effect (meaning this test can be used) for the duration of the COVID-19 declaration under Section 564(b)(1) of the Act, 21 U.S.C. section 360bbb-3(b)(1), unless the authorization is terminated or revoked sooner.    Influenza A by PCR NEGATIVE NEGATIVE Final   Influenza B by PCR  NEGATIVE NEGATIVE Final    Comment: (NOTE) The Xpert Xpress SARS-CoV-2/FLU/RSV assay is intended as an aid in  the diagnosis of influenza from Nasopharyngeal swab specimens and  should not be used as a sole basis for treatment. Nasal washings and  aspirates are unacceptable for Xpert Xpress SARS-CoV-2/FLU/RSV  testing. Fact Sheet for Patients: PinkCheek.be Fact Sheet for Healthcare Providers: GravelBags.it This test is not yet approved or cleared by the [place] FDA and  has been authorized for detection and/or diagnosis of SARS-CoV-2 by  FDA under an Emergency Use Authorization (EUA). This EUA will remain  in effect (meaning this test can be used) for the duration of the  Covid-19 declaration under Section 564(b)(1) of the Act, 21  U.S.C. section 360bbb-3(b)(1), unless the authorization is  terminated or revoked. Performed at [facility], [place] [address] [place], [place] [address]   Urine culture     Status: Abnormal   Collection Time: 01/17/20  5:28 PM   Specimen: In/Out Cath Urine  Result Value Ref Range Status   Specimen Description   Final    IN/OUT CATH URINE Performed at [facility], [address] [place], [place] [address]    Special Requests   Final    NONE Performed at [facility], [place] [address], [place], [place] [address]    Culture (A)  Final    30,000 COLONIES/mL ESCHERICHIA COLI 20,000 COLONIES/mL GROUP B STREP(S.AGALACTIAE)ISOLATED TESTING AGAINST S. AGALACTIAE NOT ROUTINELY PERFORMED DUE TO PREDICTABILITY OF AMP/PEN/VAN SUSCEPTIBILITY. Performed at [facility], [place] [address], [place], [place] [address]    Report Status 01/20/2020 FINAL  Final   Organism ID, Bacteria ESCHERICHIA COLI (A)  Final      Susceptibility   Escherichia coli - MIC*    AMPICILLIN <=2 SENSITIVE Sensitive     CEFAZOLIN <=4 SENSITIVE Sensitive      CEFTRIAXONE <=1 SENSITIVE Sensitive     CIPROFLOXACIN <=0.25 SENSITIVE Sensitive     GENTAMICIN <=1 SENSITIVE Sensitive     IMIPENEM <=0.25 SENSITIVE Sensitive     NITROFURANTOIN <=16 SENSITIVE Sensitive     TRIMETH/SULFA <=20 SENSITIVE Sensitive     AMPICILLIN/SULBACTAM <=2 SENSITIVE Sensitive     PIP/TAZO <=4 SENSITIVE Sensitive     * 30,000 COLONIES/mL ESCHERICHIA COLI  MRSA PCR Screening     Status: None   Collection Time: 01/17/20  9:35 PM   Specimen: Nasopharyngeal  Result Value Ref Range Status   MRSA by PCR NEGATIVE NEGATIVE Final    Comment:        The GeneXpert MRSA Assay (FDA approved for NASAL specimens only), is one component of a comprehensive MRSA colonization surveillance program. It is not intended to diagnose MRSA infection nor to guide or monitor treatment for MRSA infections. Performed at [facility], [place] [address] [place],  [address]     Please note: You were cared for by a hospitalist during your hospital stay. Once you are discharged, your primary care physician will handle any further medical issues. Please note that NO REFILLS for any discharge medications will be authorized once you are discharged, as it is imperative that you return to your primary care physician (or establish a relationship with a primary care physician if you do not have one) for your post hospital discharge needs so that they can reassess your need for medications and monitor your lab values.  Signed: Terrilee Croak  [facility] 01/21/2020, 1:06 PM

## 2020-01-21 NOTE — TOC Transition Note (Signed)
Transition of Care [facility]) - CM/SW Discharge Note   Patient Details  Name: AIYONNA NOBEL MRN: [id number] Date of Birth: [birth year]  Transition of [facility] CM/SW Contact:  Kirstie Peri, [place] Work Phone Number: 01/21/2020, 3:00 PM   Clinical Narrative:    MSW Intern spoke with pt, she was agreeable to [facility], stated that she had a walker at home, no preference on [facility] agency, and her husband would be able to take her home. Pt was set up with [facility], and orders were put in the AVS.    Final next level of care: Home w Home Health Services Barriers to Discharge: No Barriers Identified   Patient Goals and CMS Choice        Discharge Placement                  Name of family member notified: Patient Patient and family notified of of transfer: 01/21/20  Discharge Plan and Services                          HH Arranged: PT, OT, RN [facility] Agency: [place] Date [place]: 01/21/20 Time HH Agency Contacted: 1500 Representative spoke with at [place]: [place] ([place]) Interventions     Readmission Risk Interventions No flowsheet data found.

## 2020-01-22 LAB — CULTURE, BLOOD (ROUTINE X 2)
Culture: NO GROWTH
Culture: NO GROWTH

## 2020-01-23 DIAGNOSIS — E872 Acidosis: Secondary | ICD-10-CM | POA: Diagnosis not present

## 2020-01-23 DIAGNOSIS — E860 Dehydration: Secondary | ICD-10-CM | POA: Diagnosis not present

## 2020-01-23 DIAGNOSIS — T528X1D Toxic effect of other organic solvents, accidental (unintentional), subsequent encounter: Secondary | ICD-10-CM | POA: Diagnosis not present

## 2020-01-23 DIAGNOSIS — N170 Acute kidney failure with tubular necrosis: Secondary | ICD-10-CM | POA: Diagnosis not present

## 2020-01-23 DIAGNOSIS — I959 Hypotension, unspecified: Secondary | ICD-10-CM | POA: Diagnosis not present

## 2020-01-23 DIAGNOSIS — I10 Essential (primary) hypertension: Secondary | ICD-10-CM | POA: Diagnosis not present

## 2020-01-23 DIAGNOSIS — M160 Bilateral primary osteoarthritis of hip: Secondary | ICD-10-CM | POA: Diagnosis not present

## 2020-01-23 DIAGNOSIS — E1165 Type 2 diabetes mellitus with hyperglycemia: Secondary | ICD-10-CM | POA: Diagnosis not present

## 2020-01-23 DIAGNOSIS — A419 Sepsis, unspecified organism: Secondary | ICD-10-CM | POA: Diagnosis not present

## 2020-01-24 DIAGNOSIS — I959 Hypotension, unspecified: Secondary | ICD-10-CM | POA: Diagnosis not present

## 2020-01-24 DIAGNOSIS — T528X1D Toxic effect of other organic solvents, accidental (unintentional), subsequent encounter: Secondary | ICD-10-CM | POA: Diagnosis not present

## 2020-01-24 DIAGNOSIS — E1165 Type 2 diabetes mellitus with hyperglycemia: Secondary | ICD-10-CM | POA: Diagnosis not present

## 2020-01-24 DIAGNOSIS — I10 Essential (primary) hypertension: Secondary | ICD-10-CM | POA: Diagnosis not present

## 2020-01-24 DIAGNOSIS — N170 Acute kidney failure with tubular necrosis: Secondary | ICD-10-CM | POA: Diagnosis not present

## 2020-01-24 DIAGNOSIS — E860 Dehydration: Secondary | ICD-10-CM | POA: Diagnosis not present

## 2020-01-24 DIAGNOSIS — M160 Bilateral primary osteoarthritis of hip: Secondary | ICD-10-CM | POA: Diagnosis not present

## 2020-01-24 DIAGNOSIS — A419 Sepsis, unspecified organism: Secondary | ICD-10-CM | POA: Diagnosis not present

## 2020-01-24 DIAGNOSIS — E872 Acidosis: Secondary | ICD-10-CM | POA: Diagnosis not present

## 2020-01-24 LAB — DRUG PROFILE, UR, 9 DRUGS (LABCORP)
Benzodiazepine Quant, Ur: NEGATIVE ng/mL
Cocaine (Metab.): NEGATIVE ng/mL
Methadone Screen, Urine: NEGATIVE ng/mL
Opiate Quant, Ur: POSITIVE ng/mL — AB
Propoxyphene, Urine: NEGATIVE ng/mL

## 2020-01-27 DIAGNOSIS — E118 Type 2 diabetes mellitus with unspecified complications: Secondary | ICD-10-CM | POA: Diagnosis not present

## 2020-01-27 DIAGNOSIS — E872 Acidosis: Secondary | ICD-10-CM | POA: Diagnosis not present

## 2020-01-27 DIAGNOSIS — F411 Generalized anxiety disorder: Secondary | ICD-10-CM | POA: Diagnosis not present

## 2020-01-27 DIAGNOSIS — I10 Essential (primary) hypertension: Secondary | ICD-10-CM | POA: Diagnosis not present

## 2020-01-27 DIAGNOSIS — F1020 Alcohol dependence, uncomplicated: Secondary | ICD-10-CM | POA: Diagnosis not present

## 2020-01-28 DIAGNOSIS — A419 Sepsis, unspecified organism: Secondary | ICD-10-CM | POA: Diagnosis not present

## 2020-01-28 DIAGNOSIS — E860 Dehydration: Secondary | ICD-10-CM | POA: Diagnosis not present

## 2020-01-28 DIAGNOSIS — I959 Hypotension, unspecified: Secondary | ICD-10-CM | POA: Diagnosis not present

## 2020-01-28 DIAGNOSIS — I10 Essential (primary) hypertension: Secondary | ICD-10-CM | POA: Diagnosis not present

## 2020-01-28 DIAGNOSIS — T528X1D Toxic effect of other organic solvents, accidental (unintentional), subsequent encounter: Secondary | ICD-10-CM | POA: Diagnosis not present

## 2020-01-28 DIAGNOSIS — E1165 Type 2 diabetes mellitus with hyperglycemia: Secondary | ICD-10-CM | POA: Diagnosis not present

## 2020-01-28 DIAGNOSIS — M160 Bilateral primary osteoarthritis of hip: Secondary | ICD-10-CM | POA: Diagnosis not present

## 2020-01-28 DIAGNOSIS — N170 Acute kidney failure with tubular necrosis: Secondary | ICD-10-CM | POA: Diagnosis not present

## 2020-01-28 DIAGNOSIS — E872 Acidosis: Secondary | ICD-10-CM | POA: Diagnosis not present

## 2020-01-29 DIAGNOSIS — N170 Acute kidney failure with tubular necrosis: Secondary | ICD-10-CM | POA: Diagnosis not present

## 2020-01-29 DIAGNOSIS — M160 Bilateral primary osteoarthritis of hip: Secondary | ICD-10-CM | POA: Diagnosis not present

## 2020-01-29 DIAGNOSIS — E860 Dehydration: Secondary | ICD-10-CM | POA: Diagnosis not present

## 2020-01-29 DIAGNOSIS — A419 Sepsis, unspecified organism: Secondary | ICD-10-CM | POA: Diagnosis not present

## 2020-01-29 DIAGNOSIS — E1165 Type 2 diabetes mellitus with hyperglycemia: Secondary | ICD-10-CM | POA: Diagnosis not present

## 2020-01-29 DIAGNOSIS — T528X1D Toxic effect of other organic solvents, accidental (unintentional), subsequent encounter: Secondary | ICD-10-CM | POA: Diagnosis not present

## 2020-01-29 DIAGNOSIS — I959 Hypotension, unspecified: Secondary | ICD-10-CM | POA: Diagnosis not present

## 2020-01-29 DIAGNOSIS — E872 Acidosis: Secondary | ICD-10-CM | POA: Diagnosis not present

## 2020-01-29 DIAGNOSIS — I10 Essential (primary) hypertension: Secondary | ICD-10-CM | POA: Diagnosis not present

## 2020-01-30 DIAGNOSIS — A419 Sepsis, unspecified organism: Secondary | ICD-10-CM | POA: Diagnosis not present

## 2020-01-30 DIAGNOSIS — I959 Hypotension, unspecified: Secondary | ICD-10-CM | POA: Diagnosis not present

## 2020-01-30 DIAGNOSIS — M160 Bilateral primary osteoarthritis of hip: Secondary | ICD-10-CM | POA: Diagnosis not present

## 2020-01-30 DIAGNOSIS — E872 Acidosis: Secondary | ICD-10-CM | POA: Diagnosis not present

## 2020-01-30 DIAGNOSIS — E860 Dehydration: Secondary | ICD-10-CM | POA: Diagnosis not present

## 2020-01-30 DIAGNOSIS — T528X1D Toxic effect of other organic solvents, accidental (unintentional), subsequent encounter: Secondary | ICD-10-CM | POA: Diagnosis not present

## 2020-01-30 DIAGNOSIS — I10 Essential (primary) hypertension: Secondary | ICD-10-CM | POA: Diagnosis not present

## 2020-01-30 DIAGNOSIS — N170 Acute kidney failure with tubular necrosis: Secondary | ICD-10-CM | POA: Diagnosis not present

## 2020-01-30 DIAGNOSIS — E1165 Type 2 diabetes mellitus with hyperglycemia: Secondary | ICD-10-CM | POA: Diagnosis not present

## 2020-01-31 DIAGNOSIS — F411 Generalized anxiety disorder: Secondary | ICD-10-CM | POA: Diagnosis not present

## 2020-01-31 DIAGNOSIS — I959 Hypotension, unspecified: Secondary | ICD-10-CM | POA: Diagnosis not present

## 2020-01-31 DIAGNOSIS — E118 Type 2 diabetes mellitus with unspecified complications: Secondary | ICD-10-CM | POA: Diagnosis not present

## 2020-01-31 DIAGNOSIS — F1020 Alcohol dependence, uncomplicated: Secondary | ICD-10-CM | POA: Diagnosis not present

## 2020-01-31 DIAGNOSIS — I10 Essential (primary) hypertension: Secondary | ICD-10-CM | POA: Diagnosis not present

## 2020-02-04 DIAGNOSIS — T528X1D Toxic effect of other organic solvents, accidental (unintentional), subsequent encounter: Secondary | ICD-10-CM | POA: Diagnosis not present

## 2020-02-04 DIAGNOSIS — A419 Sepsis, unspecified organism: Secondary | ICD-10-CM | POA: Diagnosis not present

## 2020-02-04 DIAGNOSIS — M160 Bilateral primary osteoarthritis of hip: Secondary | ICD-10-CM | POA: Diagnosis not present

## 2020-02-04 DIAGNOSIS — E1165 Type 2 diabetes mellitus with hyperglycemia: Secondary | ICD-10-CM | POA: Diagnosis not present

## 2020-02-04 DIAGNOSIS — I10 Essential (primary) hypertension: Secondary | ICD-10-CM | POA: Diagnosis not present

## 2020-02-04 DIAGNOSIS — N170 Acute kidney failure with tubular necrosis: Secondary | ICD-10-CM | POA: Diagnosis not present

## 2020-02-04 DIAGNOSIS — E872 Acidosis: Secondary | ICD-10-CM | POA: Diagnosis not present

## 2020-02-04 DIAGNOSIS — E860 Dehydration: Secondary | ICD-10-CM | POA: Diagnosis not present

## 2020-02-04 DIAGNOSIS — I959 Hypotension, unspecified: Secondary | ICD-10-CM | POA: Diagnosis not present

## 2020-02-06 DIAGNOSIS — I10 Essential (primary) hypertension: Secondary | ICD-10-CM | POA: Diagnosis not present

## 2020-02-06 DIAGNOSIS — E872 Acidosis: Secondary | ICD-10-CM | POA: Diagnosis not present

## 2020-02-06 DIAGNOSIS — G4700 Insomnia, unspecified: Secondary | ICD-10-CM | POA: Diagnosis not present

## 2020-02-06 DIAGNOSIS — R197 Diarrhea, unspecified: Secondary | ICD-10-CM | POA: Diagnosis not present

## 2020-02-06 DIAGNOSIS — E118 Type 2 diabetes mellitus with unspecified complications: Secondary | ICD-10-CM | POA: Diagnosis not present

## 2020-02-06 DIAGNOSIS — F1020 Alcohol dependence, uncomplicated: Secondary | ICD-10-CM | POA: Diagnosis not present

## 2020-02-06 DIAGNOSIS — F411 Generalized anxiety disorder: Secondary | ICD-10-CM | POA: Diagnosis not present

## 2020-02-11 DIAGNOSIS — I10 Essential (primary) hypertension: Secondary | ICD-10-CM | POA: Diagnosis not present

## 2020-02-11 DIAGNOSIS — E860 Dehydration: Secondary | ICD-10-CM | POA: Diagnosis not present

## 2020-02-11 DIAGNOSIS — E872 Acidosis: Secondary | ICD-10-CM | POA: Diagnosis not present

## 2020-02-11 DIAGNOSIS — T528X1D Toxic effect of other organic solvents, accidental (unintentional), subsequent encounter: Secondary | ICD-10-CM | POA: Diagnosis not present

## 2020-02-11 DIAGNOSIS — E1165 Type 2 diabetes mellitus with hyperglycemia: Secondary | ICD-10-CM | POA: Diagnosis not present

## 2020-02-11 DIAGNOSIS — N170 Acute kidney failure with tubular necrosis: Secondary | ICD-10-CM | POA: Diagnosis not present

## 2020-02-11 DIAGNOSIS — M160 Bilateral primary osteoarthritis of hip: Secondary | ICD-10-CM | POA: Diagnosis not present

## 2020-02-11 DIAGNOSIS — A419 Sepsis, unspecified organism: Secondary | ICD-10-CM | POA: Diagnosis not present

## 2020-02-11 DIAGNOSIS — I959 Hypotension, unspecified: Secondary | ICD-10-CM | POA: Diagnosis not present

## 2020-02-13 DIAGNOSIS — I959 Hypotension, unspecified: Secondary | ICD-10-CM | POA: Diagnosis not present

## 2020-02-13 DIAGNOSIS — T528X1D Toxic effect of other organic solvents, accidental (unintentional), subsequent encounter: Secondary | ICD-10-CM | POA: Diagnosis not present

## 2020-02-13 DIAGNOSIS — I10 Essential (primary) hypertension: Secondary | ICD-10-CM | POA: Diagnosis not present

## 2020-02-13 DIAGNOSIS — E1165 Type 2 diabetes mellitus with hyperglycemia: Secondary | ICD-10-CM | POA: Diagnosis not present

## 2020-02-13 DIAGNOSIS — N170 Acute kidney failure with tubular necrosis: Secondary | ICD-10-CM | POA: Diagnosis not present

## 2020-02-13 DIAGNOSIS — E872 Acidosis: Secondary | ICD-10-CM | POA: Diagnosis not present

## 2020-02-13 DIAGNOSIS — M160 Bilateral primary osteoarthritis of hip: Secondary | ICD-10-CM | POA: Diagnosis not present

## 2020-02-13 DIAGNOSIS — A419 Sepsis, unspecified organism: Secondary | ICD-10-CM | POA: Diagnosis not present

## 2020-02-13 DIAGNOSIS — E860 Dehydration: Secondary | ICD-10-CM | POA: Diagnosis not present

## 2020-02-18 DIAGNOSIS — E860 Dehydration: Secondary | ICD-10-CM | POA: Diagnosis not present

## 2020-02-18 DIAGNOSIS — N170 Acute kidney failure with tubular necrosis: Secondary | ICD-10-CM | POA: Diagnosis not present

## 2020-02-18 DIAGNOSIS — T528X1D Toxic effect of other organic solvents, accidental (unintentional), subsequent encounter: Secondary | ICD-10-CM | POA: Diagnosis not present

## 2020-02-18 DIAGNOSIS — I10 Essential (primary) hypertension: Secondary | ICD-10-CM | POA: Diagnosis not present

## 2020-02-18 DIAGNOSIS — E872 Acidosis: Secondary | ICD-10-CM | POA: Diagnosis not present

## 2020-02-18 DIAGNOSIS — M160 Bilateral primary osteoarthritis of hip: Secondary | ICD-10-CM | POA: Diagnosis not present

## 2020-02-18 DIAGNOSIS — A419 Sepsis, unspecified organism: Secondary | ICD-10-CM | POA: Diagnosis not present

## 2020-02-18 DIAGNOSIS — I959 Hypotension, unspecified: Secondary | ICD-10-CM | POA: Diagnosis not present

## 2020-02-18 DIAGNOSIS — E1165 Type 2 diabetes mellitus with hyperglycemia: Secondary | ICD-10-CM | POA: Diagnosis not present

## 2020-02-20 DIAGNOSIS — I959 Hypotension, unspecified: Secondary | ICD-10-CM | POA: Diagnosis not present

## 2020-02-20 DIAGNOSIS — E872 Acidosis: Secondary | ICD-10-CM | POA: Diagnosis not present

## 2020-02-20 DIAGNOSIS — A419 Sepsis, unspecified organism: Secondary | ICD-10-CM | POA: Diagnosis not present

## 2020-02-20 DIAGNOSIS — I10 Essential (primary) hypertension: Secondary | ICD-10-CM | POA: Diagnosis not present

## 2020-02-20 DIAGNOSIS — E1165 Type 2 diabetes mellitus with hyperglycemia: Secondary | ICD-10-CM | POA: Diagnosis not present

## 2020-02-20 DIAGNOSIS — N170 Acute kidney failure with tubular necrosis: Secondary | ICD-10-CM | POA: Diagnosis not present

## 2020-02-20 DIAGNOSIS — T528X1D Toxic effect of other organic solvents, accidental (unintentional), subsequent encounter: Secondary | ICD-10-CM | POA: Diagnosis not present

## 2020-02-20 DIAGNOSIS — M160 Bilateral primary osteoarthritis of hip: Secondary | ICD-10-CM | POA: Diagnosis not present

## 2020-02-20 DIAGNOSIS — E860 Dehydration: Secondary | ICD-10-CM | POA: Diagnosis not present

## 2020-02-22 DIAGNOSIS — E872 Acidosis: Secondary | ICD-10-CM | POA: Diagnosis not present

## 2020-02-22 DIAGNOSIS — I10 Essential (primary) hypertension: Secondary | ICD-10-CM | POA: Diagnosis not present

## 2020-02-22 DIAGNOSIS — E860 Dehydration: Secondary | ICD-10-CM | POA: Diagnosis not present

## 2020-02-22 DIAGNOSIS — N170 Acute kidney failure with tubular necrosis: Secondary | ICD-10-CM | POA: Diagnosis not present

## 2020-02-22 DIAGNOSIS — M160 Bilateral primary osteoarthritis of hip: Secondary | ICD-10-CM | POA: Diagnosis not present

## 2020-02-22 DIAGNOSIS — I959 Hypotension, unspecified: Secondary | ICD-10-CM | POA: Diagnosis not present

## 2020-02-22 DIAGNOSIS — E1165 Type 2 diabetes mellitus with hyperglycemia: Secondary | ICD-10-CM | POA: Diagnosis not present

## 2020-02-22 DIAGNOSIS — T528X1D Toxic effect of other organic solvents, accidental (unintentional), subsequent encounter: Secondary | ICD-10-CM | POA: Diagnosis not present

## 2020-02-22 DIAGNOSIS — A419 Sepsis, unspecified organism: Secondary | ICD-10-CM | POA: Diagnosis not present

## 2020-02-25 DIAGNOSIS — E872 Acidosis: Secondary | ICD-10-CM | POA: Diagnosis not present

## 2020-02-25 DIAGNOSIS — A419 Sepsis, unspecified organism: Secondary | ICD-10-CM | POA: Diagnosis not present

## 2020-02-25 DIAGNOSIS — E1165 Type 2 diabetes mellitus with hyperglycemia: Secondary | ICD-10-CM | POA: Diagnosis not present

## 2020-02-25 DIAGNOSIS — E860 Dehydration: Secondary | ICD-10-CM | POA: Diagnosis not present

## 2020-02-25 DIAGNOSIS — M160 Bilateral primary osteoarthritis of hip: Secondary | ICD-10-CM | POA: Diagnosis not present

## 2020-02-25 DIAGNOSIS — I10 Essential (primary) hypertension: Secondary | ICD-10-CM | POA: Diagnosis not present

## 2020-02-25 DIAGNOSIS — I959 Hypotension, unspecified: Secondary | ICD-10-CM | POA: Diagnosis not present

## 2020-02-25 DIAGNOSIS — N170 Acute kidney failure with tubular necrosis: Secondary | ICD-10-CM | POA: Diagnosis not present

## 2020-02-25 DIAGNOSIS — T528X1D Toxic effect of other organic solvents, accidental (unintentional), subsequent encounter: Secondary | ICD-10-CM | POA: Diagnosis not present

## 2020-02-27 DIAGNOSIS — F411 Generalized anxiety disorder: Secondary | ICD-10-CM | POA: Diagnosis not present

## 2020-02-27 DIAGNOSIS — E1165 Type 2 diabetes mellitus with hyperglycemia: Secondary | ICD-10-CM | POA: Diagnosis not present

## 2020-02-27 DIAGNOSIS — I10 Essential (primary) hypertension: Secondary | ICD-10-CM | POA: Diagnosis not present

## 2020-02-27 DIAGNOSIS — A419 Sepsis, unspecified organism: Secondary | ICD-10-CM | POA: Diagnosis not present

## 2020-02-27 DIAGNOSIS — E860 Dehydration: Secondary | ICD-10-CM | POA: Diagnosis not present

## 2020-02-27 DIAGNOSIS — T528X1D Toxic effect of other organic solvents, accidental (unintentional), subsequent encounter: Secondary | ICD-10-CM | POA: Diagnosis not present

## 2020-02-27 DIAGNOSIS — F1020 Alcohol dependence, uncomplicated: Secondary | ICD-10-CM | POA: Diagnosis not present

## 2020-02-27 DIAGNOSIS — E118 Type 2 diabetes mellitus with unspecified complications: Secondary | ICD-10-CM | POA: Diagnosis not present

## 2020-02-27 DIAGNOSIS — E872 Acidosis: Secondary | ICD-10-CM | POA: Diagnosis not present

## 2020-02-27 DIAGNOSIS — I959 Hypotension, unspecified: Secondary | ICD-10-CM | POA: Diagnosis not present

## 2020-02-27 DIAGNOSIS — M160 Bilateral primary osteoarthritis of hip: Secondary | ICD-10-CM | POA: Diagnosis not present

## 2020-02-27 DIAGNOSIS — N170 Acute kidney failure with tubular necrosis: Secondary | ICD-10-CM | POA: Diagnosis not present

## 2020-03-03 DIAGNOSIS — E1165 Type 2 diabetes mellitus with hyperglycemia: Secondary | ICD-10-CM | POA: Diagnosis not present

## 2020-03-03 DIAGNOSIS — I10 Essential (primary) hypertension: Secondary | ICD-10-CM | POA: Diagnosis not present

## 2020-03-03 DIAGNOSIS — A419 Sepsis, unspecified organism: Secondary | ICD-10-CM | POA: Diagnosis not present

## 2020-03-03 DIAGNOSIS — M160 Bilateral primary osteoarthritis of hip: Secondary | ICD-10-CM | POA: Diagnosis not present

## 2020-03-03 DIAGNOSIS — E860 Dehydration: Secondary | ICD-10-CM | POA: Diagnosis not present

## 2020-03-03 DIAGNOSIS — E872 Acidosis: Secondary | ICD-10-CM | POA: Diagnosis not present

## 2020-03-03 DIAGNOSIS — N170 Acute kidney failure with tubular necrosis: Secondary | ICD-10-CM | POA: Diagnosis not present

## 2020-03-03 DIAGNOSIS — T528X1D Toxic effect of other organic solvents, accidental (unintentional), subsequent encounter: Secondary | ICD-10-CM | POA: Diagnosis not present

## 2020-03-03 DIAGNOSIS — I959 Hypotension, unspecified: Secondary | ICD-10-CM | POA: Diagnosis not present

## 2020-03-10 DIAGNOSIS — N170 Acute kidney failure with tubular necrosis: Secondary | ICD-10-CM | POA: Diagnosis not present

## 2020-03-10 DIAGNOSIS — M160 Bilateral primary osteoarthritis of hip: Secondary | ICD-10-CM | POA: Diagnosis not present

## 2020-03-10 DIAGNOSIS — I959 Hypotension, unspecified: Secondary | ICD-10-CM | POA: Diagnosis not present

## 2020-03-10 DIAGNOSIS — E1165 Type 2 diabetes mellitus with hyperglycemia: Secondary | ICD-10-CM | POA: Diagnosis not present

## 2020-03-10 DIAGNOSIS — E860 Dehydration: Secondary | ICD-10-CM | POA: Diagnosis not present

## 2020-03-10 DIAGNOSIS — E872 Acidosis: Secondary | ICD-10-CM | POA: Diagnosis not present

## 2020-03-10 DIAGNOSIS — A419 Sepsis, unspecified organism: Secondary | ICD-10-CM | POA: Diagnosis not present

## 2020-03-10 DIAGNOSIS — I10 Essential (primary) hypertension: Secondary | ICD-10-CM | POA: Diagnosis not present

## 2020-03-10 DIAGNOSIS — T528X1D Toxic effect of other organic solvents, accidental (unintentional), subsequent encounter: Secondary | ICD-10-CM | POA: Diagnosis not present

## 2020-03-17 DIAGNOSIS — A419 Sepsis, unspecified organism: Secondary | ICD-10-CM | POA: Diagnosis not present

## 2020-03-17 DIAGNOSIS — E1165 Type 2 diabetes mellitus with hyperglycemia: Secondary | ICD-10-CM | POA: Diagnosis not present

## 2020-03-17 DIAGNOSIS — I10 Essential (primary) hypertension: Secondary | ICD-10-CM | POA: Diagnosis not present

## 2020-03-17 DIAGNOSIS — M160 Bilateral primary osteoarthritis of hip: Secondary | ICD-10-CM | POA: Diagnosis not present

## 2020-03-17 DIAGNOSIS — N170 Acute kidney failure with tubular necrosis: Secondary | ICD-10-CM | POA: Diagnosis not present

## 2020-03-17 DIAGNOSIS — I959 Hypotension, unspecified: Secondary | ICD-10-CM | POA: Diagnosis not present

## 2020-03-17 DIAGNOSIS — T528X1D Toxic effect of other organic solvents, accidental (unintentional), subsequent encounter: Secondary | ICD-10-CM | POA: Diagnosis not present

## 2020-03-17 DIAGNOSIS — E860 Dehydration: Secondary | ICD-10-CM | POA: Diagnosis not present

## 2020-03-17 DIAGNOSIS — E872 Acidosis: Secondary | ICD-10-CM | POA: Diagnosis not present

## 2020-05-12 ENCOUNTER — Other Ambulatory Visit (HOSPITAL_BASED_OUTPATIENT_CLINIC_OR_DEPARTMENT_OTHER): Payer: Self-pay | Admitting: Family Medicine

## 2020-05-12 DIAGNOSIS — Z1231 Encounter for screening mammogram for malignant neoplasm of breast: Secondary | ICD-10-CM

## 2020-10-23 ENCOUNTER — Other Ambulatory Visit: Payer: Self-pay | Admitting: *Deleted

## 2020-10-23 ENCOUNTER — Other Ambulatory Visit: Payer: Self-pay | Admitting: Family Medicine

## 2020-10-23 DIAGNOSIS — Z13820 Encounter for screening for osteoporosis: Secondary | ICD-10-CM

## 2020-10-30 ENCOUNTER — Emergency Department (HOSPITAL_BASED_OUTPATIENT_CLINIC_OR_DEPARTMENT_OTHER): Payer: Medicare HMO

## 2020-10-30 ENCOUNTER — Inpatient Hospital Stay (HOSPITAL_BASED_OUTPATIENT_CLINIC_OR_DEPARTMENT_OTHER)
Admission: EM | Admit: 2020-10-30 | Discharge: 2020-11-02 | DRG: 480 | Disposition: A | Discharge status: Home Health | Payer: Medicare HMO | Attending: Internal Medicine | Admitting: Internal Medicine

## 2020-10-30 ENCOUNTER — Encounter (HOSPITAL_BASED_OUTPATIENT_CLINIC_OR_DEPARTMENT_OTHER): Payer: Self-pay | Admitting: Emergency Medicine

## 2020-10-30 ENCOUNTER — Inpatient Hospital Stay (HOSPITAL_COMMUNITY): Payer: Medicare HMO

## 2020-10-30 ENCOUNTER — Other Ambulatory Visit: Payer: Self-pay

## 2020-10-30 DIAGNOSIS — W06XXXA Fall from bed, initial encounter: Secondary | ICD-10-CM | POA: Diagnosis present

## 2020-10-30 DIAGNOSIS — Z902 Acquired absence of lung [part of]: Secondary | ICD-10-CM | POA: Diagnosis not present

## 2020-10-30 DIAGNOSIS — R590 Localized enlarged lymph nodes: Secondary | ICD-10-CM

## 2020-10-30 DIAGNOSIS — E1142 Type 2 diabetes mellitus with diabetic polyneuropathy: Secondary | ICD-10-CM | POA: Diagnosis present

## 2020-10-30 DIAGNOSIS — E118 Type 2 diabetes mellitus with unspecified complications: Secondary | ICD-10-CM

## 2020-10-30 DIAGNOSIS — D869 Sarcoidosis, unspecified: Secondary | ICD-10-CM | POA: Diagnosis present

## 2020-10-30 DIAGNOSIS — S72002A Fracture of unspecified part of neck of left femur, initial encounter for closed fracture: Secondary | ICD-10-CM | POA: Diagnosis not present

## 2020-10-30 DIAGNOSIS — Y92003 Bedroom of unspecified non-institutional (private) residence as the place of occurrence of the external cause: Secondary | ICD-10-CM | POA: Diagnosis not present

## 2020-10-30 DIAGNOSIS — Z794 Long term (current) use of insulin: Secondary | ICD-10-CM | POA: Diagnosis not present

## 2020-10-30 DIAGNOSIS — I10 Essential (primary) hypertension: Secondary | ICD-10-CM | POA: Diagnosis present

## 2020-10-30 DIAGNOSIS — E1110 Type 2 diabetes mellitus with ketoacidosis without coma: Secondary | ICD-10-CM | POA: Diagnosis present

## 2020-10-30 DIAGNOSIS — F1010 Alcohol abuse, uncomplicated: Secondary | ICD-10-CM | POA: Diagnosis present

## 2020-10-30 DIAGNOSIS — Z7984 Long term (current) use of oral hypoglycemic drugs: Secondary | ICD-10-CM | POA: Diagnosis not present

## 2020-10-30 DIAGNOSIS — E860 Dehydration: Secondary | ICD-10-CM | POA: Diagnosis present

## 2020-10-30 DIAGNOSIS — E8729 Other acidosis: Secondary | ICD-10-CM | POA: Diagnosis present

## 2020-10-30 DIAGNOSIS — Z01811 Encounter for preprocedural respiratory examination: Secondary | ICD-10-CM

## 2020-10-30 DIAGNOSIS — Z79899 Other long term (current) drug therapy: Secondary | ICD-10-CM

## 2020-10-30 DIAGNOSIS — S72012A Unspecified intracapsular fracture of left femur, initial encounter for closed fracture: Secondary | ICD-10-CM | POA: Diagnosis present

## 2020-10-30 DIAGNOSIS — Z85118 Personal history of other malignant neoplasm of bronchus and lung: Secondary | ICD-10-CM

## 2020-10-30 DIAGNOSIS — C779 Secondary and unspecified malignant neoplasm of lymph node, unspecified: Secondary | ICD-10-CM | POA: Diagnosis present

## 2020-10-30 DIAGNOSIS — N179 Acute kidney failure, unspecified: Secondary | ICD-10-CM | POA: Diagnosis present

## 2020-10-30 DIAGNOSIS — Z20822 Contact with and (suspected) exposure to covid-19: Secondary | ICD-10-CM | POA: Diagnosis present

## 2020-10-30 DIAGNOSIS — F418 Other specified anxiety disorders: Secondary | ICD-10-CM | POA: Diagnosis present

## 2020-10-30 DIAGNOSIS — E872 Acidosis: Secondary | ICD-10-CM | POA: Diagnosis present

## 2020-10-30 DIAGNOSIS — S22070D Wedge compression fracture of T9-T10 vertebra, subsequent encounter for fracture with routine healing: Secondary | ICD-10-CM | POA: Diagnosis not present

## 2020-10-30 DIAGNOSIS — T148XXA Other injury of unspecified body region, initial encounter: Secondary | ICD-10-CM

## 2020-10-30 DIAGNOSIS — R338 Other retention of urine: Secondary | ICD-10-CM | POA: Diagnosis present

## 2020-10-30 DIAGNOSIS — S22000A Wedge compression fracture of unspecified thoracic vertebra, initial encounter for closed fracture: Secondary | ICD-10-CM | POA: Diagnosis not present

## 2020-10-30 DIAGNOSIS — E878 Other disorders of electrolyte and fluid balance, not elsewhere classified: Secondary | ICD-10-CM

## 2020-10-30 DIAGNOSIS — W19XXXA Unspecified fall, initial encounter: Secondary | ICD-10-CM

## 2020-10-30 LAB — CBC WITH DIFFERENTIAL/PLATELET
Abs Immature Granulocytes: 0.06 10*3/uL (ref 0.00–0.07)
Basophils Absolute: 0.1 10*3/uL (ref 0.0–0.1)
Basophils Relative: 0 %
Eosinophils Absolute: 0 10*3/uL (ref 0.0–0.5)
Eosinophils Relative: 0 %
HCT: 45 % (ref 36.0–46.0)
Hemoglobin: 15.2 g/dL — ABNORMAL HIGH (ref 12.0–15.0)
Immature Granulocytes: 0 %
Lymphocytes Relative: 7 %
Lymphs Abs: 1.1 10*3/uL (ref 0.7–4.0)
MCH: 31.2 pg (ref 26.0–34.0)
MCHC: 33.8 g/dL (ref 30.0–36.0)
MCV: 92.4 fL (ref 80.0–100.0)
Monocytes Absolute: 1.2 10*3/uL — ABNORMAL HIGH (ref 0.1–1.0)
Monocytes Relative: 7 %
Neutro Abs: 14.3 10*3/uL — ABNORMAL HIGH (ref 1.7–7.7)
Neutrophils Relative %: 86 %
Platelets: 229 10*3/uL (ref 150–400)
RBC: 4.87 MIL/uL (ref 3.87–5.11)
RDW: 16.1 % — ABNORMAL HIGH (ref 11.5–15.5)
WBC: 16.6 10*3/uL — ABNORMAL HIGH (ref 4.0–10.5)
nRBC: 0 % (ref 0.0–0.2)

## 2020-10-30 LAB — BASIC METABOLIC PANEL
Anion gap: 24 — ABNORMAL HIGH (ref 5–15)
Anion gap: 20 — ABNORMAL HIGH (ref 5–15)
BUN: 17 mg/dL (ref 8–23)
BUN: 15 mg/dL (ref 8–23)
CO2: 13 mmol/L — ABNORMAL LOW (ref 22–32)
CO2: 14 mmol/L — ABNORMAL LOW (ref 22–32)
Calcium: 9.5 mg/dL (ref 8.9–10.3)
Calcium: 8.9 mg/dL (ref 8.9–10.3)
Chloride: 100 mmol/L (ref 98–111)
Chloride: 105 mmol/L (ref 98–111)
Creatinine, Ser: 1.29 mg/dL — ABNORMAL HIGH (ref 0.44–1.00)
Creatinine, Ser: 1.04 mg/dL — ABNORMAL HIGH (ref 0.44–1.00)
GFR, Estimated: 46 mL/min — ABNORMAL LOW (ref >60)
GFR, Estimated: 59 mL/min — ABNORMAL LOW (ref >60)
Glucose, Bld: 140 mg/dL — ABNORMAL HIGH (ref 70–99)
Glucose, Bld: 140 mg/dL — ABNORMAL HIGH (ref 70–99)
Potassium: 4.1 mmol/L (ref 3.5–5.1)
Potassium: 4.1 mmol/L (ref 3.5–5.1)
Sodium: 137 mmol/L (ref 135–145)
Sodium: 139 mmol/L (ref 135–145)

## 2020-10-30 LAB — I-STAT VENOUS BLOOD GAS, ED
Acid-base deficit: 10 mmol/L — ABNORMAL HIGH (ref 0.0–2.0)
Bicarbonate: 15.2 mmol/L — ABNORMAL LOW (ref 20.0–28.0)
Calcium, Ion: 1.21 mmol/L (ref 1.15–1.40)
HCT: 43 % (ref 36.0–46.0)
Hemoglobin: 14.6 g/dL (ref 12.0–15.0)
O2 Saturation: 80 %
Potassium: 4 mmol/L (ref 3.5–5.1)
Sodium: 138 mmol/L (ref 135–145)
TCO2: 16 mmol/L — ABNORMAL LOW (ref 22–32)
pCO2, Ven: 30.4 mmHg — ABNORMAL LOW (ref 44.0–60.0)
pH, Ven: 7.306 (ref 7.250–7.430)
pO2, Ven: 48 mmHg — ABNORMAL HIGH (ref 32.0–45.0)

## 2020-10-30 LAB — CBG MONITORING, ED: Glucose-Capillary: 129 mg/dL — ABNORMAL HIGH (ref 70–99)

## 2020-10-30 LAB — GLUCOSE, CAPILLARY: Glucose-Capillary: 124 mg/dL — ABNORMAL HIGH (ref 70–99)

## 2020-10-30 LAB — BETA-HYDROXYBUTYRIC ACID: Beta-Hydroxybutyric Acid: >8 mmol/L — ABNORMAL HIGH (ref 0.05–0.27)

## 2020-10-30 LAB — LACTIC ACID, PLASMA
Lactic Acid, Venous: 1 mmol/L (ref 0.5–1.9)
Lactic Acid, Venous: 0.8 mmol/L (ref 0.5–1.9)

## 2020-10-30 LAB — HEMOGLOBIN A1C
Hgb A1c MFr Bld: 6.7 % — ABNORMAL HIGH (ref 4.8–5.6)
Mean Plasma Glucose: 145.59 mg/dL

## 2020-10-30 LAB — SARS CORONAVIRUS 2 BY RT PCR (HOSPITAL ORDER, PERFORMED IN ~~LOC~~ HOSPITAL LAB): SARS Coronavirus 2: NEGATIVE

## 2020-10-30 MED ORDER — THIAMINE HCL 100 MG PO TABS
100.0000 mg | ORAL_TABLET | Freq: Every day | ORAL | Status: DC
  Administered 2020-10-31 – 2020-11-02 (×3): 100 mg via ORAL
  Filled 2020-10-30 (×4): qty 1

## 2020-10-30 MED ORDER — INSULIN REGULAR(HUMAN) IN NACL 100-0.9 UT/100ML-% IV SOLN
INTRAVENOUS | Status: DC
  Administered 2020-10-30: 1.5 [IU]/h via INTRAVENOUS
  Filled 2020-10-30: qty 100

## 2020-10-30 MED ORDER — FENTANYL CITRATE (PF) 100 MCG/2ML IJ SOLN
50.0000 ug | Freq: Once | INTRAMUSCULAR | Status: AC
  Administered 2020-10-30: 50 ug via INTRAVENOUS
  Filled 2020-10-30: qty 2

## 2020-10-30 MED ORDER — THIAMINE HCL 100 MG/ML IJ SOLN: 100.0000 mg | Freq: Every day | INTRAMUSCULAR | Status: DC

## 2020-10-30 MED ORDER — FOLIC ACID 1 MG PO TABS
1.0000 mg | ORAL_TABLET | Freq: Every day | ORAL | Status: DC
  Administered 2020-10-31 – 2020-11-02 (×3): 1 mg via ORAL
  Filled 2020-10-30 (×3): qty 1

## 2020-10-30 MED ORDER — LORAZEPAM 1 MG PO TABS
1.0000 mg | ORAL_TABLET | ORAL | Status: DC | PRN
Start: 2020-10-30 — End: 2020-11-02
  Administered 2020-11-01: 2 mg via ORAL
  Filled 2020-10-30: qty 2

## 2020-10-30 MED ORDER — SODIUM CHLORIDE 0.9 % IV BOLUS
1000.0000 mL | Freq: Once | INTRAVENOUS | Status: AC
  Administered 2020-10-30: 1000 mL via INTRAVENOUS

## 2020-10-30 MED ORDER — HYDROMORPHONE HCL 1 MG/ML IJ SOLN
1.0000 mg | Freq: Once | INTRAMUSCULAR | Status: AC
Start: 2020-10-30 — End: 2020-10-30
  Administered 2020-10-30: 1 mg via INTRAVENOUS
  Filled 2020-10-30: qty 1

## 2020-10-30 MED ORDER — DEXTROSE 50 % IV SOLN
0.0000 mL | INTRAVENOUS | Status: DC | PRN
  Filled 2020-10-30: qty 50

## 2020-10-30 MED ORDER — HYDROCODONE-ACETAMINOPHEN 5-325 MG PO TABS
1.0000 | ORAL_TABLET | Freq: Four times a day (QID) | ORAL | Status: DC | PRN
  Administered 2020-10-30 – 2020-10-31 (×2): 2 via ORAL
  Filled 2020-10-30 (×2): qty 2

## 2020-10-30 MED ORDER — DEXTROSE IN LACTATED RINGERS 5 % IV SOLN
INTRAVENOUS | Status: DC
  Filled 2020-10-30: qty 1000

## 2020-10-30 MED ORDER — LORAZEPAM 2 MG/ML IJ SOLN
1.0000 mg | INTRAMUSCULAR | Status: DC | PRN
  Administered 2020-10-31: 2 mg via INTRAVENOUS
  Filled 2020-10-30 (×2): qty 1

## 2020-10-30 MED ORDER — POTASSIUM CHLORIDE 10 MEQ/100ML IV SOLN
10.0000 meq | INTRAVENOUS | Status: AC
  Administered 2020-10-30 (×2): 10 meq via INTRAVENOUS
  Filled 2020-10-30 (×2): qty 100

## 2020-10-30 MED ORDER — HYDROCODONE-ACETAMINOPHEN 5-325 MG PO TABS: 1.0000 | ORAL_TABLET | Freq: Once | ORAL | Status: DC

## 2020-10-30 MED ORDER — MORPHINE SULFATE (PF) 2 MG/ML IV SOLN: 0.5000 mg | INTRAVENOUS | Status: DC | PRN

## 2020-10-30 MED ORDER — LACTATED RINGERS IV SOLN: INTRAVENOUS | Status: DC

## 2020-10-30 MED ORDER — INSULIN REGULAR(HUMAN) IN NACL 100-0.9 UT/100ML-% IV SOLN
INTRAVENOUS | Status: AC
  Filled 2020-10-30: qty 100

## 2020-10-30 MED ORDER — ADULT MULTIVITAMIN W/MINERALS CH
1.0000 | ORAL_TABLET | Freq: Every day | ORAL | Status: DC
  Administered 2020-10-31 – 2020-11-02 (×3): 1 via ORAL
  Filled 2020-10-30 (×3): qty 1

## 2020-10-30 NOTE — ED Notes (Signed)
I called husband with pt's room number and nursing station contact number at [facility].  I attempted to call report.  Waiting on nurse at [facility] to call me back

## 2020-10-30 NOTE — ED Notes (Signed)
Pt just returned from [place] via gurney

## 2020-10-30 NOTE — Progress Notes (Signed)
Orthopedic Tech Progress Note Patient Details:  [name] October 17, 1955 [id number] Called in order to [facility] for a TLSO BRACE. RN at [facility] said patient would be coming to [place] not sure but I did call it in and she said she would let me know if things are to change Patient ID: [name], female   DOB: [birth year], 65 y.o.   MRN: [id number]   [name] 10/30/2020, 3:34 PM

## 2020-10-30 NOTE — ED Provider Notes (Addendum)
[facility] EMERGENCY DEPARTMENT Provider Note   CSN: [id number] Arrival date & time: 10/30/20  1132     History Chief Complaint  Patient presents with  . Leg Pain    [name] is a 65 y.o. female with PMHx HTN, Diabetes, alcohol abuse who presents to the ED today with complaint of sudden onset, constant, sharp, left hip pain s/p fall that occurred around 2 AM out of bed this morning. Pt reports she believes she was laying on the side of the bed and turned and fell; landing onto her left hip. She denies hitting her head. She states she woke up immediately with pain to her hip. Her husband had to help her back into bed. She states she took one of her husband's Tramadol around 3 AM and tried to go back to sleep. She states that she has been unable to bare weight onto the hip since then prompting her to come to the ED today. Pt also complains of gradual onset upper back pain that began when waking up this morning. No other complaints at this time. Pt is not anticoagulated.   The history is provided by the patient and medical records.       Past Medical History:  Diagnosis Date  . Alcohol abuse   . Anxiety   . Cancer ([place])    lung  . Diabetes mellitus   . Headache(784.0)   . Hypertension   . Pancreatitis, acute     Patient Active Problem List   Diagnosis Date Noted  . Closed left hip fracture ([place]) 10/30/2020  . Sepsis ([place]) 01/17/2020  . Metabolic acidosis [id number]  . 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 Dehydration 04/11/2012  . Acute kidney injury (AKI) with acute tubular necrosis (ATN) (H[facility]) 04/11/2012  . DM type 2 causing complication ([place]) [id number]  . HTN (hypertension) 04/11/2012  . Alcohol abuse 04/11/2012  . Hx of cancer of lung 04/11/2012  . Hyponatremia 04/11/2012  . Fracture of left humerus 04/11/2012  . Pancreatitis, acute     Past Surgical History:  Procedure Laterality Date  . APPENDECTOMY    . CESAREAN SECTION    . JOINT  REPLACEMENT     rt shoulder  . LUNG LOBECTOMY    . ORIF HUMERUS FRACTURE  04/13/2012   Procedure: OPEN REDUCTION INTERNAL FIXATION (ORIF) PROXIMAL HUMERUS FRACTURE;  Surgeon: Augustin Schooling, MD;  Location: [facility];  Service: Orthopedics;  Laterality: Left;  open reduction internal fixation left proximal humerus fracture  . SHOULDER SURGERY       OB History   No obstetric history on file.     Family History  Problem Relation Age of Onset  . Hypertension Mother   . COPD Father   . Diabetes Father     Social History   Tobacco Use  . Smoking status: Never Smoker  . Smokeless tobacco: Never Used  Substance Use Topics  . Alcohol use: Yes  . Drug use: No    Home Medications Prior to Admission medications   Medication Sig Start Date End Date Taking? Authorizing Provider  atorvastatin (LIPITOR) 10 MG tablet Take 10 mg by mouth daily.  04/18/16   [provider]  Candesartan Cilexetil-HCTZ 32-25 MG TABS Take 1 tablet by mouth every morning.    [provider]  diltiazem (CARDIZEM CD) 180 MG 24 hr capsule Take 180 mg by mouth daily.    [provider]  insulin aspart (NOVOLOG FLEXPEN) 100 UNIT/ML FlexPen Inject 0-10 Units  into the skin 3 (three) times daily as needed for high blood sugar.    [provider]  Insulin Pen Needle 29G X 12MM MISC Dispense insulin pen needles 04/16/12   Marye Round, Sorin C, MD  metFORMIN (GLUCOPHAGE-XR) 500 MG 24 hr tablet Take 500 mg by mouth daily with breakfast.  03/31/16   [provider]  TOUJEO SOLOSTAR 300 UNIT/ML Solostar Pen Inject 24 Units into the skin daily.  11/18/19   [provider]  vitamin B-12 (CYANOCOBALAMIN) 1000 MCG tablet Take 1,000 mcg by mouth daily.    [provider]  vitamin C (ASCORBIC ACID) 500 MG tablet Take 500 mg by mouth daily.    [provider]    Allergies    Ciprofloxacin  Review of Systems   Review of Systems  Constitutional: Negative for chills and  fever.  Musculoskeletal: Positive for arthralgias and back pain.  Neurological: Negative for headaches.  All other systems reviewed and are negative.   Physical Exam Updated Vital Signs BP (!) 165/83   Pulse (!) 110   Temp 98.5 F (36.9 C) (Oral)   Resp 18   Ht 5\' 8"  (1.727 m)   Wt 68 kg   SpO2 99%   BMI 22.81 kg/m   Physical Exam Vitals and nursing note reviewed.  Constitutional:      Appearance: She is not ill-appearing.     Comments: Thin female; writhing around in bed s/2 pain  HENT:     Head: Normocephalic and atraumatic.  Eyes:     Extraocular Movements: Extraocular movements intact.     Conjunctiva/sclera: Conjunctivae normal.     Pupils: Pupils are equal, round, and reactive to light.  Cardiovascular:     Rate and Rhythm: Regular rhythm. Tachycardia present.     Pulses: Normal pulses.  Pulmonary:     Effort: Pulmonary effort is normal.     Breath sounds: Normal breath sounds. No wheezing, rhonchi or rales.  Abdominal:     Palpations: Abdomen is soft.     Tenderness: There is no abdominal tenderness. There is no guarding or rebound.  Musculoskeletal:     Cervical back: Neck supple. No tenderness.     Comments: No ecchymosis or deformity appreciated to left hip. No external rotation or leg shortening. + TTP to the lateral aspect of the left hip. ROM limited s/2 pain. Strength 4/5 s/2 pain with hip flexion. Pain illicited with log roll of left leg. No tenderness to distally to mid femur. 2+ DP pulse.   No lumbar TTP however + midline thoracic TTP. No obvious parathoracic musculature TTP. No C midline TTP. Strength 5/5 to BUEs. Sensation intact throughout.   Skin:    General: Skin is warm and dry.  Neurological:     Mental Status: She is alert.     ED Results / Procedures / Treatments   Labs (all labs ordered are listed, but only abnormal results are displayed) Labs Reviewed  BASIC METABOLIC PANEL - Abnormal; Notable for the following components:       Result Value   CO2 13 (*)    Glucose, Bld 140 (*)    Creatinine, Ser 1.29 (*)    GFR, Estimated 46 (*)    Anion gap 24 (*)    All other components within normal limits  CBC WITH DIFFERENTIAL/PLATELET - Abnormal; Notable for the following components:   WBC 16.6 (*)    Hemoglobin 15.2 (*)    RDW 16.1 (*)    Neutro Abs  14.3 (*)    Monocytes Absolute 1.2 (*)    All other components within normal limits  BETA-HYDROXYBUTYRIC ACID - Abnormal; Notable for the following components:   Beta-Hydroxybutyric Acid >8.00 (*)    All other components within normal limits  I-STAT VENOUS BLOOD GAS, ED - Abnormal; Notable for the following components:   pCO2, Ven 30.4 (*)    pO2, Ven 48.0 (*)    Bicarbonate 15.2 (*)    TCO2 16 (*)    Acid-base deficit 10.0 (*)    All other components within normal limits  SARS CORONAVIRUS 2 BY RT PCR (HOSPITAL ORDER, [facility])  LACTIC ACID, PLASMA  LACTIC ACID, PLASMA  CBG MONITORING, ED    EKG EKG Interpretation  Date/Time:  Friday October 30 2020 15:01:29 EST Ventricular Rate:  88 PR Interval:    QRS Duration: 91 QT Interval:  393 QTC Calculation: 476 R Axis:   48 Text Interpretation: Sinus rhythm Abnormal R-wave progression, early transition Since last tracing QT has shortened Confirmed by Calvert Cantor [phone]) on 10/30/2020 3:05:34 PM   Radiology CT Thoracic Spine Wo Contrast  Result Date: 10/30/2020 CLINICAL DATA:  Back trauma.  Fell a bed with pain. EXAM: CT THORACIC SPINE WITHOUT CONTRAST TECHNIQUE: Multidetector CT images of the thoracic were obtained using the standard protocol without intravenous contrast. COMPARISON:  Chest radiograph 05/01/2013. FINDINGS: Alignment: Normal. Vertebrae: Compression deformities of the T9 and T10 vertebral bodies with approximately 40-50% height loss at T9 and 20% height loss at T10. There is no trabecular sclerosis, discrete fracture lucency, or substantial paraspinal soft tissue  stranding. Otherwise, vertebral body heights are maintained. Diffuse osteopenia. Paraspinal and other soft tissues: Small right pleural effusion with opacities in the medial aspect of the right lower lobe.There are two partially imaged 9 mm perifissural nodule in the the left upper lobe and lingula, which appear increased in size relative to prior from 2007. Mediastinal adenopathy. Partially imaged pancreatic calcifications, likely related to chronic pancreatitis. There is an adjacent peripherally calcified 3.4 cm lesion along the tail the pancreas. Disc levels: Mild multilevel degenerative change without evidence of significant bony canal stenosis. IMPRESSION: 1. T9 and T10 compression deformities with approximately 40-50% height loss of T9 and 20% height loss of T10. These are favored remote given similar appearance on prior chest radiograph from 05/01/2013 and given no trabecular sclerosis, discrete fracture lucency or paraspinal edema. If the patient is focally tender, an MRI could further evaluate for marrow edema. 2. Small right pleural effusion with overlying medial right lower lobe opacities, which could represent atelectasis, aspiration, or pneumonia. 3. Mediastinal adenopathy and two partially imaged 9 mm perifissural nodule in the the left upper lobe and lingula. The nodules appear increased in size relative to prior from 2007. Recommend non urgent chest CT with contrast for further evaluation. 4. Partially imaged pancreatic calcifications, likely related to chronic pancreatitis. There is an adjacent peripherally calcified 3.4 cm lesion along the tail the pancreas. While this is favored to reflect a pseudocyst secondary to prior pancreatitis, it is incompletely evaluated. Recommend CT of the abdomen with contrast or MRI to further characterize and exclude solid component or aneurysm. Electronically Signed   By: Margaretha Sheffield MD   On: 10/30/2020 14:08   CT Hip Left Wo Contrast  Result Date:  10/30/2020 CLINICAL DATA:  Status post fall out of bed last night. EXAM: CT OF THE LEFT HIP WITHOUT CONTRAST TECHNIQUE: Multidetector CT imaging of the left hip was performed  according to the standard protocol. Multiplanar CT image reconstructions were also generated. COMPARISON:  None. FINDINGS: Bones/Joint/Cartilage Nondisplaced impacted left subcapital femoral neck fracture. No other acute fracture or dislocation. Normal alignment. No joint effusion. No aggressive osseous lesion. Ligaments Ligaments are suboptimally evaluated by CT. Muscles and Tendons Muscles are normal.  No intramuscular fluid collection or hematoma. Soft tissue No fluid collection or hematoma. No soft tissue mass. Mild soft tissue edema in the subcutaneous fat overlying the left hip. IMPRESSION: Acute nondisplaced impacted left subcapital femoral neck fracture. Electronically Signed   By: Kathreen Devoid   On: 10/30/2020 13:34   DG FEMUR MIN 2 VIEWS LEFT  Result Date: 10/30/2020 CLINICAL DATA:  Fall, left leg pain EXAM: LEFT FEMUR 2 VIEWS COMPARISON:  None. FINDINGS: Two view radiograph left femur demonstrates an impacted left subcapital femoral neck fracture, likely acute. The femoral head is still seated within the left acetabulum. Mild left hip degenerative arthritis. The remainder of the femur appears intact. No lytic or blastic bone lesion. Soft tissues are unremarkable. IMPRESSION: Suspected acute left subcapital femoral neck fracture. Correlation for point tenderness is recommended. This could be confirmed with CT imaging if physical examination findings are equivocal. Electronically Signed   By: Fidela Salisbury MD   On: 10/30/2020 12:38    Procedures .Critical Care Performed by: Eustaquio Maize, PA-C Authorized by: Eustaquio Maize, PA-C   Critical care provider statement:    Critical care time (minutes):  45   Critical care was necessary to treat or prevent imminent or life-threatening deterioration of the following conditions:   Metabolic crisis   Critical care was time spent personally by me on the following activities:  Discussions with consultants, evaluation of patient's response to treatment, examination of patient, ordering and performing treatments and interventions, ordering and review of laboratory studies, ordering and review of radiographic studies, pulse oximetry, re-evaluation of patient's condition, obtaining history from patient or surrogate and review of old charts     Medications Ordered in ED Medications  insulin regular, human (MYXREDLIN) 100 units/ 100 mL infusion (has no administration in time range)  lactated ringers infusion (has no administration in time range)  dextrose 5 % in lactated ringers infusion (has no administration in time range)  dextrose 50 % solution 0-50 mL (has no administration in time range)  sodium chloride 0.9 % bolus 1,000 mL (has no administration in time range)  potassium chloride 10 mEq in 100 mL IVPB (has no administration in time range)  fentaNYL (SUBLIMAZE) injection 50 mcg (50 mcg Intravenous Given 10/30/20 1302)  HYDROmorphone (DILAUDID) injection 1 mg (1 mg Intravenous Given 10/30/20 1545)  sodium chloride 0.9 % bolus 1,000 mL (0 mLs Intravenous Stopped 10/30/20 1708)    ED Course  I have reviewed the triage vital signs and the nursing notes.  Pertinent labs & imaging results that were available during my care of the patient were reviewed by me and considered in my medical decision making (see chart for details).  Clinical Course as of 10/30/20 1939  Ludwig Clarks Oct 30, 2020  1416 Dr. Lynann Bologna with orthopedist aware of patient - will call back regarding which hospital pt needs to be admitted to.  [MV]  1437 Discussed case with Neurosurgeon Dr. Reatha Armour; recommends TLSO brace. Will follow along and likely evaluate in the outpatient setting [MV]    Clinical Course User Index [MV] Eustaquio Maize, PA-C   MDM Rules/Calculators/A&P  66 year old  female who presents to the ED today secondary to falling out of bed last night and landing onto her left hip.  Has been unable to walk on her left hip since then.  Is also having some midline thoracic back pain.  On arrival to the ED patient is noted to be tachycardic, I suspect this is secondary to pain.  She is afebrile and nontachypneic.  She does appear uncomfortable on exam today, she is found writhing around in her bed secondary to pain.  She has no signs of ecchymosis or deformity to her left hip however she is noted to have tenderness palpation directly over the left hip, x-ray is pending.  She was taken to x-ray before being evaluated.  She is also noted to have midline thoracic spinal tenderness palpation.  No C midline or L midline spinal tenderness.  She has equal strength in her upper extremities.  Her strength is 4 out of 5 with hip flexion on the left side however I suspect this is also secondary to pain.  Will await x-ray of the left hip.  May consider doing a CT scan of her thoracic spine given midline tenderness, will await x-ray to see if she needs additional imaging prior to going to CT scan.  She denies any head injury.  She is anticoagulated.  There is no signs of head trauma at this time, do not feel she needs a head CT scan currently.  X-ray has returned with concern for subcapital femoral neck fracture, radiologist recommends CT scan if patient does have point tenderness along this area which she does.  Given she is going for CT scan of her left hip we will also provide CT T-spine at this time.  Lab work ordered.  Unable to obtain type and screen here at this facility.  Patient may need admission for her hip fracture.   CT Hip with acute nondisplaced impacted left subcapital neck fracture. Still awaiting CT T spine at this time. EKG and labwork ordered for admission.   CBC with a leukocytosis of 16,000, question elevation s/2 pain? BMP with glucose 140, bicarb 13, and a gap of 24.  Will add on VBG, beta hydroxybutyric acid, and lactic acid at this time.   CT T spine IMPRESSION:  1. T9 and T10 compression deformities with approximately 40-50%  height loss of T9 and 20% height loss of T10. These are favored  remote given similar appearance on prior chest radiograph from  05/01/2013 and given no trabecular sclerosis, discrete fracture  lucency or paraspinal edema. If the patient is focally tender, an  MRI could further evaluate for marrow edema.  2. Small right pleural effusion with overlying medial right lower  lobe opacities, which could represent atelectasis, aspiration, or  pneumonia.  3. Mediastinal adenopathy and two partially imaged 9 mm perifissural  nodule in the the left upper lobe and lingula. The nodules appear  increased in size relative to prior from 2007. Recommend non urgent  chest CT with contrast for further evaluation.  4. Partially imaged pancreatic calcifications, likely related to  chronic pancreatitis. There is an adjacent peripherally calcified  3.4 cm lesion along the tail the pancreas. While this is favored to  reflect a pseudocyst secondary to prior pancreatitis, it is  incompletely evaluated. Recommend CT of the abdomen with contrast or  MRI to further characterize and exclude solid component or aneurysm.   Neurosurgery made aware - TLSO brace ordered at this time. Remainder of CT findings do  appear chronic in nature and can be followed up in the outpatient setting.   VBG with pH 7.306. pCO2 low and bicarb low as well; pt receiving fluids at this time. Still awaiting beta hydroxybutyric acid and lactic acid. May be in euglycemic DKA?   Lactic acid within normal limits 1.0 Still pending beta hydoxybutryic acid but does not appear to be in a lactic acidosis today.   Discussed case with Dr. Lonny Prude at [facility] who agrees to accept patient for admission.   7:38 PM While pt was still pending transfer to [facility] her beta hydroxybutyric acid has  returned elevated > 8.00. Dr. Lonny Prude is currently off after 7 PM; discussed case with Dr. Alcario Drought Hospitalist who recommends starting pt on endotool. Have ordered at this time.   This note was prepared using Dragon voice recognition software and may include unintentional dictation errors due to the inherent limitations of voice recognition software.  Final Clinical Impression(s) / ED Diagnoses Final diagnoses:  Closed subcapital fracture of left femur, initial encounter ([place])  Compression fracture of body of thoracic vertebra ([facility])  Low bicarbonate  Ketoacidosis    Rx / DC Orders ED Discharge Orders    None         Eustaquio Maize, PA-C 10/30/20 1939    Margette Fast, MD 11/06/20 [phone]

## 2020-10-30 NOTE — ED Triage Notes (Signed)
Reports she fell out of bed last night.  Now having pain in left upper leg.

## 2020-10-30 NOTE — ED Notes (Signed)
Ortho tech at [facility] called regarding TLSO order.  Pt is going to be admitted to hospital.  Will call tech back once time frame for admission is narrowed down.

## 2020-10-30 NOTE — Anesthesia Preprocedure Evaluation (Addendum)
Anesthesia Evaluation  Patient identified by MRN, date of birth, ID band Patient awake    Reviewed: Allergy & Precautions, NPO status , Patient's Chart, lab work & pertinent test results  Airway Mallampati: II  TM Distance: >3 FB Neck ROM: Full    Dental  (+) Dental Advisory Given   Pulmonary neg pulmonary ROS,    Pulmonary exam normal breath sounds clear to auscultation       Cardiovascular hypertension, Pt. on medications Normal cardiovascular exam Rhythm:Regular Rate:Normal     Neuro/Psych  Headaches, PSYCHIATRIC DISORDERS Anxiety    GI/Hepatic negative GI ROS, Neg liver ROS,   Endo/Other  diabetes  Renal/GU Renal disease     Musculoskeletal negative musculoskeletal ROS (+)   Abdominal   Peds  Hematology negative hematology ROS (+)   Anesthesia Other Findings   Reproductive/Obstetrics                            Anesthesia Physical Anesthesia Plan  ASA: III  Anesthesia Plan: General   Post-op Pain Management:    Induction: Intravenous  PONV Risk Score and Plan: 4 or greater and Ondansetron, Dexamethasone, Midazolam, Treatment may vary due to age or medical condition and Diphenhydramine  Airway Management Planned: Oral ETT  Additional Equipment: None  Intra-op Plan:   Post-operative Plan: Extubation in OR  Informed Consent:   Plan Discussed with:   Anesthesia Plan Comments:         Anesthesia Quick Evaluation

## 2020-10-30 NOTE — ED Notes (Signed)
I called pts husband regarding  Room change at [facility].  Waiting on room to be cleaned

## 2020-10-30 NOTE — H&P (Signed)
History and Physical    [name]:[id number] DOB: 24-May-1956 DOA: 10/30/2020  PCP: System, Provider Not In  Patient coming from: Home  I have personally briefly reviewed patient's old medical records in [facility]  Chief Complaint: Hip pain, fall  HPI: [name] is a 65 y.o. 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  Pt presents to ED at [facility] today with c/o sudden onset, sharp, L hip pain following a mechanical fall that occurred at 2AM out of bed.  Larey Seat out of bed landing on L hip.  Woke her up immediately with pain in L hip.  Husband helped her get back to bed.  Took one of husbands tramadol around 3am and tried to go back to sleep.  This morning unable to bear wt on hip since that time, severe pain.  This prompted her to come to ED.  Also gradual onset upper back pain that began when waking this AM.   ED Course: work up in ED shows: 1) L hip fx 2) AG metabolic acidosis, apparently ketosis with BHB level > 8.0, BGL only 140.  Lactate Nl, pH 7.30. 3) T9 and T10 compression fx, but radiologist thinks these are remote given similar appearance on CXR in 2014. 4) incidental findings: mediastinal adenopathy, pulm nodule, pancreatic calcifications (recd CT chest with contrast and CT abd with contrast for further evaluation).   Review of Systems: As per HPI, otherwise all review of systems negative.  Past Medical History:  Diagnosis Date  . Alcohol abuse   . Anxiety   . Cancer (HCC)    lung  . Diabetes mellitus   . Headache(784.0)   . Hypertension   . Pancreatitis, acute     Past Surgical History:  Procedure Laterality Date  . APPENDECTOMY    . CESAREAN SECTION    . JOINT REPLACEMENT     rt shoulder  . LUNG LOBECTOMY    . ORIF HUMERUS FRACTURE  04/13/2012   Procedure: OPEN REDUCTION INTERNAL FIXATION (ORIF) PROXIMAL HUMERUS FRACTURE;  Surgeon: Verlee Rossetti, MD;  Location: [facility] OR;  Service:  Orthopedics;  Laterality: Left;  open reduction internal fixation left proximal humerus fracture  . SHOULDER SURGERY       reports that she has never smoked. She has never used smokeless tobacco. She reports current alcohol use. She reports that she does not use drugs.  Allergies  Allergen Reactions  . Ciprofloxacin Diarrhea    Family History  Problem Relation Age of Onset  . Hypertension Mother   . COPD Father   . Diabetes Father      Prior to Admission medications   Medication Sig Start Date End Date Taking? Authorizing Provider  atorvastatin (LIPITOR) 10 MG tablet Take 10 mg by mouth daily.  04/18/16   [provider]  Candesartan Cilexetil-HCTZ 32-25 MG TABS Take 1 tablet by mouth every morning.    [provider]  diltiazem (CARDIZEM CD) 180 MG 24 hr capsule Take 180 mg by mouth daily.    [provider]  insulin aspart (NOVOLOG FLEXPEN) 100 UNIT/ML FlexPen Inject 0-10 Units into the skin 3 (three) times daily as needed for high blood sugar.    [provider]  Insulin Pen Needle 29G X MISC Dispense insulin pen needles 04/16/12   Lavera Guise, Sorin C, MD  metFORMIN (GLUCOPHAGE-XR) 500 MG 24 hr tablet Take 500 mg by mouth daily with breakfast.  03/31/16  [provider]  TOUJEO SOLOSTAR 300 UNIT/ML Solostar Pen Inject 24 Units into the skin daily.  11/18/19   [provider]  vitamin B-12 (CYANOCOBALAMIN) 1000 MCG tablet Take 1,000 mcg by mouth daily.    [provider]  vitamin C (ASCORBIC ACID) 500 MG tablet Take 500 mg by mouth daily.    [provider]    Physical Exam: Vitals:   10/30/20 1739 10/30/20 1800 10/30/20 2050 10/30/20 2232  BP: 140/72 (!) 151/77 (!) 145/73 (!) 144/80  Pulse: 77 76 93 95  Resp:  (!) 22 (!) 22 20  Temp:   98 F (36.7 C) 99.1 F (37.3 C)  TempSrc:   Oral Oral  SpO2:  100% 96% 99%  Weight:    65.3 kg  Height:        Constitutional: NAD, calm, uncomfortable Eyes: PERRL,  lids and conjunctivae normal ENMT: Mucous membranes are moist. Posterior pharynx clear of any exudate or lesions.Normal dentition.  Neck: normal, supple, no masses, no thyromegaly Respiratory: clear to auscultation bilaterally, no wheezing, no crackles. Normal respiratory effort. No accessory muscle use.  Cardiovascular: Regular rate and rhythm, no murmurs / rubs / gallops. No extremity edema. 2+ pedal pulses. No carotid bruits.  Abdomen: no tenderness, no masses palpated. No hepatosplenomegaly. Bowel sounds positive.  Musculoskeletal: L hip TTP Skin: no rashes, lesions, ulcers. No induration Neurologic: CN 2-12 grossly intact. Sensation intact, DTR normal. Strength 5/5 in all 4.  Psychiatric: Normal judgment and insight. Alert and oriented x 3. Normal mood.    Labs on Admission: I have personally reviewed following labs and imaging studies  CBC: Recent Labs  Lab 10/30/20 1300 10/30/20 1506  WBC 16.6*  --   NEUTROABS 14.3*  --   HGB 15.2* 14.6  HCT 45.0 43.0  MCV 92.4  --   PLT 229  --    Basic Metabolic Panel: Recent Labs  Lab 10/30/20 1300 10/30/20 1506 10/30/20 2025  NA 137 138 139  K 4.1 4.0 4.1  CL 100  --  105  CO2 13*  --  14*  GLUCOSE 140*  --  140*  BUN 17  --  15  CREATININE 1.29*  --  1.04*  CALCIUM 9.5  --  8.9   GFR: Estimated Creatinine Clearance: 53.7 mL/min (A) (by C-G formula based on SCr of 1.04 mg/dL (H)). Liver Function Tests: No results for input(s): AST, ALT, ALKPHOS, BILITOT, PROT, ALBUMIN in the last 168 hours. No results for input(s): LIPASE, AMYLASE in the last 168 hours. No results for input(s): AMMONIA in the last 168 hours. Coagulation Profile: No results for input(s): INR, PROTIME in the last 168 hours. Cardiac Enzymes: No results for input(s): CKTOTAL, CKMB, CKMBINDEX, TROPONINI in the last 168 hours. BNP (last 3 results) No results for input(s): PROBNP in the last 8760 hours. HbA1C: No results for input(s): HGBA1C in the last 72  hours. CBG: Recent Labs  Lab 10/30/20 1958 10/30/20 2244  GLUCAP 129* 124*   Lipid Profile: No results for input(s): CHOL, HDL, LDLCALC, TRIG, CHOLHDL, LDLDIRECT in the last 72 hours. Thyroid Function Tests: No results for input(s): TSH, T4TOTAL, FREET4, T3FREE, THYROIDAB in the last 72 hours. Anemia Panel: No results for input(s): VITAMINB12, FOLATE, FERRITIN, TIBC, IRON, RETICCTPCT in the last 72 hours. Urine analysis:    Component Value Date/Time   COLORURINE YELLOW 01/17/2020 1728   APPEARANCEUR CLOUDY (A) 01/17/2020 1728   LABSPEC 1.023 01/17/2020 1728   PHURINE 5.0 01/17/2020 1728  GLUCOSEU 50 (A) 01/17/2020 1728   HGBUR LARGE (A) 01/17/2020 1728   BILIRUBINUR NEGATIVE 01/17/2020 1728   KETONESUR NEGATIVE 01/17/2020 1728   PROTEINUR 30 (A) 01/17/2020 1728   NITRITE NEGATIVE 01/17/2020 1728   LEUKOCYTESUR NEGATIVE 01/17/2020 1728    Radiological Exams on Admission: CT Thoracic Spine Wo Contrast  Result Date: 10/30/2020 CLINICAL DATA:  Back trauma.  Fell a bed with pain. EXAM: CT THORACIC SPINE WITHOUT CONTRAST TECHNIQUE: Multidetector CT images of the thoracic were obtained using the standard protocol without intravenous contrast. COMPARISON:  Chest radiograph 05/01/2013. FINDINGS: Alignment: Normal. Vertebrae: Compression deformities of the T9 and T10 vertebral bodies with approximately 40-50% height loss at T9 and 20% height loss at T10. There is no trabecular sclerosis, discrete fracture lucency, or substantial paraspinal soft tissue stranding. Otherwise, vertebral body heights are maintained. Diffuse osteopenia. Paraspinal and other soft tissues: Small right pleural effusion with opacities in the medial aspect of the right lower lobe.There are two partially imaged 9 mm perifissural nodule in the the left upper lobe and lingula, which appear increased in size relative to prior from 2007. Mediastinal adenopathy. Partially imaged pancreatic calcifications, likely related to  chronic pancreatitis. There is an adjacent peripherally calcified 3.4 cm lesion along the tail the pancreas. Disc levels: Mild multilevel degenerative change without evidence of significant bony canal stenosis. IMPRESSION: 1. T9 and T10 compression deformities with approximately 40-50% height loss of T9 and 20% height loss of T10. These are favored remote given similar appearance on prior chest radiograph from 05/01/2013 and given no trabecular sclerosis, discrete fracture lucency or paraspinal edema. If the patient is focally tender, an MRI could further evaluate for marrow edema. 2. Small right pleural effusion with overlying medial right lower lobe opacities, which could represent atelectasis, aspiration, or pneumonia. 3. Mediastinal adenopathy and two partially imaged 9 mm perifissural nodule in the the left upper lobe and lingula. The nodules appear increased in size relative to prior from 2007. Recommend non urgent chest CT with contrast for further evaluation. 4. Partially imaged pancreatic calcifications, likely related to chronic pancreatitis. There is an adjacent peripherally calcified 3.4 cm lesion along the tail the pancreas. While this is favored to reflect a pseudocyst secondary to prior pancreatitis, it is incompletely evaluated. Recommend CT of the abdomen with contrast or MRI to further characterize and exclude solid component or aneurysm. Electronically Signed   By: Margaretha Sheffield MD   On: 10/30/2020 14:08   CT Hip Left Wo Contrast  Result Date: 10/30/2020 CLINICAL DATA:  Status post fall out of bed last night. EXAM: CT OF THE LEFT HIP WITHOUT CONTRAST TECHNIQUE: Multidetector CT imaging of the left hip was performed according to the standard protocol. Multiplanar CT image reconstructions were also generated. COMPARISON:  None. FINDINGS: Bones/Joint/Cartilage Nondisplaced impacted left subcapital femoral neck fracture. No other acute fracture or dislocation. Normal alignment. No joint  effusion. No aggressive osseous lesion. Ligaments Ligaments are suboptimally evaluated by CT. Muscles and Tendons Muscles are normal.  No intramuscular fluid collection or hematoma. Soft tissue No fluid collection or hematoma. No soft tissue mass. Mild soft tissue edema in the subcutaneous fat overlying the left hip. IMPRESSION: Acute nondisplaced impacted left subcapital femoral neck fracture. Electronically Signed   By: Kathreen Devoid   On: 10/30/2020 13:34   DG FEMUR MIN 2 VIEWS LEFT  Result Date: 10/30/2020 CLINICAL DATA:  Fall, left leg pain EXAM: LEFT FEMUR 2 VIEWS COMPARISON:  None. FINDINGS: Two view radiograph left femur demonstrates an impacted  left subcapital femoral neck fracture, likely acute. The femoral head is still seated within the left acetabulum. Mild left hip degenerative arthritis. The remainder of the femur appears intact. No lytic or blastic bone lesion. Soft tissues are unremarkable. IMPRESSION: Suspected acute left subcapital femoral neck fracture. Correlation for point tenderness is recommended. This could be confirmed with CT imaging if physical examination findings are equivocal. Electronically Signed   By: Fidela Salisbury MD   On: 10/30/2020 12:38    EKG: Independently reviewed.  Assessment/Plan Principal Problem:   Closed left hip fracture (HCC) Active Problems:   DM type 2 causing complication (HCC)   HTN (hypertension)   Alcohol abuse   Hx of cancer of lung   Ketoacidosis    1. Closed L hip fx - 1. Hip fx pathway 2. CXR pending 3. Ortho called by ED 4. NPO 5. Pain ctrl with PO and IV narcotics per pathway 2. Ketoacidosis - 1. Mostly Alcoholic ketoacidosis but also is diabetic 2. IV insulin per endotool with D5LR 3. BMP Q4H, BHB Q8H 4. DKA pathway 3. T9 and T10 compression fractures 1. NS consulted by EDP 2. Recd TLSO brace which is in place now 4. DM2 - 1. IV insulin as above for ketoacidosis 2. Hold home meds 5. EtOH abuse - 1. CIWA 6. HTN  - 1. Cont home BP meds when med rec completed 7. Pulm nodule and pancreas findings - 1. Needs f/u CT chest with contrast and CT abd with contrast for further evaluation.  DVT prophylaxis: SCDs - delay chemo ppx as pt needs OR repair Code Status: Full Family Communication: No family in room Disposition Plan: SNF vs CIR after hip fx repair Consults called: Ortho and NS Admission status: Admit to inpatient  Severity of Illness: The appropriate patient status for this patient is INPATIENT. Inpatient status is judged to be reasonable and necessary in order to provide the required intensity of service to ensure the patient's safety. The patient's presenting symptoms, physical exam findings, and initial radiographic and laboratory data in the context of their chronic comorbidities is felt to place them at high risk for further clinical deterioration. Furthermore, it is not anticipated that the patient will be medically stable for discharge from the hospital within 2 midnights of admission. The following factors support the patient status of inpatient.   IP status due to: 1) acute hip fx requiring OR repair 2) ketoacidosis   * I certify that at the point of admission it is my clinical judgment that the patient will require inpatient hospital care spanning beyond 2 midnights from the point of admission due to high intensity of service, high risk for further deterioration and high frequency of surveillance required.*    Suraiya Dickerson M. DO [facility]  How to contact the [facility] Attending or Consulting provider [place] or covering provider during after hours [place], for this patient?  1. Check the care team in [facility] and look for a) attending/consulting [facility] provider listed and b) the [facility] team listed 2. Log into [email or web address]  Amion Physician Scheduling and messaging for groups and whole hospitals  On call and physician scheduling software for group practices, residents, hospitalists and other  medical providers for call, clinic, rotation and shift schedules. OnCall [employer] is a hospital-wide system for scheduling doctors and paging doctors on call. EasyPlot is for scientific plotting and data analysis.  [email or web address]  and use [facility] universal password to access. If you do not have the password, please contact  the hospital operator.  3. Locate the [facility] provider you are looking for under [facility] and page to a number that you can be directly reached. 4. If you still have difficulty reaching the provider, please page the [facility] (Director on Call) for the Hospitalists listed on amion for assistance.  10/30/2020, 11:30 PM

## 2020-10-30 NOTE — Progress Notes (Addendum)
Received pt from [place] to 4E24. VSS. CHG bath done and tele applied, verified x2. Rates pain 8/10 in L hip- admitting for [facility] was paged, awaiting further orders.  CBG 124- noted that pt was to be started on endotool but it was never started. Started insulin gtt at 1.5u/hr per endotool. Also started ordered fluids and potassium. Will continue to monitor.

## 2020-10-30 NOTE — ED Notes (Signed)
EKG was attempted, Pt was moving around to much to get the EKG. Will attempt at a later time to get the EKG.

## 2020-10-31 ENCOUNTER — Encounter (HOSPITAL_COMMUNITY)
Admission: EM | Disposition: A | Discharge status: Home Health | Payer: Self-pay | Source: Home / Self Care | Attending: Internal Medicine

## 2020-10-31 ENCOUNTER — Inpatient Hospital Stay (HOSPITAL_COMMUNITY): Payer: Medicare HMO

## 2020-10-31 ENCOUNTER — Inpatient Hospital Stay (HOSPITAL_COMMUNITY): Payer: Medicare HMO | Admitting: Anesthesiology

## 2020-10-31 DIAGNOSIS — E118 Type 2 diabetes mellitus with unspecified complications: Secondary | ICD-10-CM

## 2020-10-31 DIAGNOSIS — S72002A Fracture of unspecified part of neck of left femur, initial encounter for closed fracture: Secondary | ICD-10-CM | POA: Diagnosis not present

## 2020-10-31 DIAGNOSIS — I10 Essential (primary) hypertension: Secondary | ICD-10-CM | POA: Diagnosis not present

## 2020-10-31 DIAGNOSIS — F1010 Alcohol abuse, uncomplicated: Secondary | ICD-10-CM

## 2020-10-31 DIAGNOSIS — E872 Acidosis: Secondary | ICD-10-CM

## 2020-10-31 HISTORY — PX: HIP PINNING,CANNULATED: SHX1758

## 2020-10-31 LAB — GLUCOSE, CAPILLARY
Glucose-Capillary: 123 mg/dL — ABNORMAL HIGH (ref 70–99)
Glucose-Capillary: 134 mg/dL — ABNORMAL HIGH (ref 70–99)
Glucose-Capillary: 135 mg/dL — ABNORMAL HIGH (ref 70–99)
Glucose-Capillary: 138 mg/dL — ABNORMAL HIGH (ref 70–99)
Glucose-Capillary: 138 mg/dL — ABNORMAL HIGH (ref 70–99)
Glucose-Capillary: 138 mg/dL — ABNORMAL HIGH (ref 70–99)
Glucose-Capillary: 143 mg/dL — ABNORMAL HIGH (ref 70–99)
Glucose-Capillary: 146 mg/dL — ABNORMAL HIGH (ref 70–99)
Glucose-Capillary: 148 mg/dL — ABNORMAL HIGH (ref 70–99)
Glucose-Capillary: 157 mg/dL — ABNORMAL HIGH (ref 70–99)
Glucose-Capillary: 158 mg/dL — ABNORMAL HIGH (ref 70–99)
Glucose-Capillary: 165 mg/dL — ABNORMAL HIGH (ref 70–99)
Glucose-Capillary: 165 mg/dL — ABNORMAL HIGH (ref 70–99)
Glucose-Capillary: 172 mg/dL — ABNORMAL HIGH (ref 70–99)
Glucose-Capillary: 181 mg/dL — ABNORMAL HIGH (ref 70–99)

## 2020-10-31 LAB — HEPATIC FUNCTION PANEL
ALT: 44 U/L (ref 0–44)
AST: 57 U/L — ABNORMAL HIGH (ref 15–41)
Albumin: 3.4 g/dL — ABNORMAL LOW (ref 3.5–5.0)
Alkaline Phosphatase: 110 U/L (ref 38–126)
Bilirubin, Direct: 0.4 mg/dL — ABNORMAL HIGH (ref 0.0–0.2)
Indirect Bilirubin: 1.7 mg/dL — ABNORMAL HIGH (ref 0.3–0.9)
Total Bilirubin: 2.1 mg/dL — ABNORMAL HIGH (ref 0.3–1.2)
Total Protein: 6.5 g/dL (ref 6.5–8.1)

## 2020-10-31 LAB — BASIC METABOLIC PANEL
Anion gap: 18 — ABNORMAL HIGH (ref 5–15)
Anion gap: 14 (ref 5–15)
Anion gap: 14 (ref 5–15)
BUN: 14 mg/dL (ref 8–23)
BUN: 12 mg/dL (ref 8–23)
BUN: 6 mg/dL — ABNORMAL LOW (ref 8–23)
CO2: 13 mmol/L — ABNORMAL LOW (ref 22–32)
CO2: 15 mmol/L — ABNORMAL LOW (ref 22–32)
CO2: 21 mmol/L — ABNORMAL LOW (ref 22–32)
Calcium: 8.8 mg/dL — ABNORMAL LOW (ref 8.9–10.3)
Calcium: 8.5 mg/dL — ABNORMAL LOW (ref 8.9–10.3)
Calcium: 8.6 mg/dL — ABNORMAL LOW (ref 8.9–10.3)
Chloride: 109 mmol/L (ref 98–111)
Chloride: 110 mmol/L (ref 98–111)
Chloride: 106 mmol/L (ref 98–111)
Creatinine, Ser: 1.14 mg/dL — ABNORMAL HIGH (ref 0.44–1.00)
Creatinine, Ser: 1.02 mg/dL — ABNORMAL HIGH (ref 0.44–1.00)
Creatinine, Ser: 0.96 mg/dL (ref 0.44–1.00)
GFR, Estimated: 53 mL/min — ABNORMAL LOW (ref >60)
GFR, Estimated: >60 mL/min (ref >60)
GFR, Estimated: >60 mL/min (ref >60)
Glucose, Bld: 132 mg/dL — ABNORMAL HIGH (ref 70–99)
Glucose, Bld: 156 mg/dL — ABNORMAL HIGH (ref 70–99)
Glucose, Bld: 173 mg/dL — ABNORMAL HIGH (ref 70–99)
Potassium: 4.8 mmol/L (ref 3.5–5.1)
Potassium: 3.8 mmol/L (ref 3.5–5.1)
Potassium: 3.3 mmol/L — ABNORMAL LOW (ref 3.5–5.1)
Sodium: 140 mmol/L (ref 135–145)
Sodium: 139 mmol/L (ref 135–145)
Sodium: 141 mmol/L (ref 135–145)

## 2020-10-31 LAB — BETA-HYDROXYBUTYRIC ACID
Beta-Hydroxybutyric Acid: 7.19 mmol/L — ABNORMAL HIGH (ref 0.05–0.27)
Beta-Hydroxybutyric Acid: 3.72 mmol/L — ABNORMAL HIGH (ref 0.05–0.27)

## 2020-10-31 LAB — SURGICAL PCR SCREEN
MRSA, PCR: NEGATIVE
Staphylococcus aureus: POSITIVE — AB

## 2020-10-31 LAB — CBC
HCT: 43 % (ref 36.0–46.0)
Hemoglobin: 14.1 g/dL (ref 12.0–15.0)
MCH: 30.9 pg (ref 26.0–34.0)
MCHC: 32.8 g/dL (ref 30.0–36.0)
MCV: 94.3 fL (ref 80.0–100.0)
Platelets: 161 10*3/uL (ref 150–400)
RBC: 4.56 MIL/uL (ref 3.87–5.11)
RDW: 16.3 % — ABNORMAL HIGH (ref 11.5–15.5)
WBC: 9.2 10*3/uL (ref 4.0–10.5)
nRBC: 0 % (ref 0.0–0.2)

## 2020-10-31 LAB — TYPE AND SCREEN
ABO/RH(D): A POS
Antibody Screen: NEGATIVE

## 2020-10-31 LAB — ABO/RH: ABO/RH(D): A POS

## 2020-10-31 LAB — PHOSPHORUS: Phosphorus: 2.6 mg/dL (ref 2.5–4.6)

## 2020-10-31 LAB — MAGNESIUM: Magnesium: 1.7 mg/dL (ref 1.7–2.4)

## 2020-10-31 SURGERY — FIXATION, FEMUR, NECK, PERCUTANEOUS, USING SCREW
Anesthesia: General | Site: Hip | Laterality: Left

## 2020-10-31 MED ORDER — WHITE PETROLATUM EX OINT
TOPICAL_OINTMENT | CUTANEOUS | Status: DC | PRN
  Filled 2020-10-31 (×2): qty 28.35

## 2020-10-31 MED ORDER — PROPOFOL 10 MG/ML IV BOLUS
INTRAVENOUS | Status: AC
  Filled 2020-10-31: qty 20

## 2020-10-31 MED ORDER — 0.9 % SODIUM CHLORIDE (POUR BTL) OPTIME
TOPICAL | Status: DC | PRN
  Administered 2020-10-31: 1000 mL

## 2020-10-31 MED ORDER — INSULIN ASPART 100 UNIT/ML ~~LOC~~ SOLN: 0.0000 [IU] | Freq: Every day | SUBCUTANEOUS | Status: DC

## 2020-10-31 MED ORDER — LACTATED RINGERS IV SOLN: INTRAVENOUS | Status: DC

## 2020-10-31 MED ORDER — DULOXETINE HCL 20 MG PO CPEP
20.0000 mg | ORAL_CAPSULE | Freq: Every day | ORAL | Status: DC
  Administered 2020-10-31 – 2020-11-02 (×3): 20 mg via ORAL
  Filled 2020-10-31 (×3): qty 1

## 2020-10-31 MED ORDER — CHLORHEXIDINE GLUCONATE CLOTH 2 % EX PADS
6.0000 | MEDICATED_PAD | Freq: Every day | CUTANEOUS | Status: DC
  Administered 2020-10-31 – 2020-11-01 (×2): 6 via TOPICAL

## 2020-10-31 MED ORDER — PROMETHAZINE HCL 25 MG/ML IJ SOLN: 6.2500 mg | INTRAMUSCULAR | Status: DC | PRN

## 2020-10-31 MED ORDER — INSULIN ASPART 100 UNIT/ML ~~LOC~~ SOLN: 0.0000 [IU] | Freq: Three times a day (TID) | SUBCUTANEOUS | Status: DC

## 2020-10-31 MED ORDER — HYDROMORPHONE HCL 1 MG/ML IJ SOLN
INTRAMUSCULAR | Status: AC
  Filled 2020-10-31: qty 1

## 2020-10-31 MED ORDER — CEFAZOLIN SODIUM-DEXTROSE 2-4 GM/100ML-% IV SOLN
INTRAVENOUS | Status: AC
  Filled 2020-10-31: qty 100

## 2020-10-31 MED ORDER — MUPIROCIN 2 % EX OINT
1.0000 "application " | TOPICAL_OINTMENT | Freq: Two times a day (BID) | CUTANEOUS | Status: DC
  Administered 2020-10-31 – 2020-11-02 (×5): 1 via NASAL
  Filled 2020-10-31 (×3): qty 22

## 2020-10-31 MED ORDER — CHLORHEXIDINE GLUCONATE 0.12 % MT SOLN
OROMUCOSAL | Status: AC
  Administered 2020-10-31: 15 mL via OROMUCOSAL
  Filled 2020-10-31: qty 15

## 2020-10-31 MED ORDER — FENTANYL CITRATE (PF) 250 MCG/5ML IJ SOLN
INTRAMUSCULAR | Status: AC
  Filled 2020-10-31: qty 5

## 2020-10-31 MED ORDER — DIPHENHYDRAMINE HCL 50 MG/ML IJ SOLN
INTRAMUSCULAR | Status: DC | PRN
  Administered 2020-10-31: 6.25 mg via INTRAVENOUS

## 2020-10-31 MED ORDER — HYDROCODONE-ACETAMINOPHEN 7.5-325 MG PO TABS
1.0000 | ORAL_TABLET | Freq: Four times a day (QID) | ORAL | Status: DC | PRN
  Administered 2020-10-31 – 2020-11-01 (×3): 2 via ORAL
  Filled 2020-10-31 (×3): qty 2

## 2020-10-31 MED ORDER — ROCURONIUM BROMIDE 10 MG/ML (PF) SYRINGE
PREFILLED_SYRINGE | INTRAVENOUS | Status: DC | PRN
  Administered 2020-10-31: 20 mg via INTRAVENOUS
  Administered 2020-10-31: 50 mg via INTRAVENOUS

## 2020-10-31 MED ORDER — CEFAZOLIN SODIUM-DEXTROSE 2-3 GM-%(50ML) IV SOLR
INTRAVENOUS | Status: DC | PRN
  Administered 2020-10-31: 2 g via INTRAVENOUS

## 2020-10-31 MED ORDER — INSULIN DETEMIR 100 UNIT/ML ~~LOC~~ SOLN
10.0000 [IU] | Freq: Every day | SUBCUTANEOUS | Status: DC
  Administered 2020-10-31 – 2020-11-02 (×3): 10 [IU] via SUBCUTANEOUS
  Filled 2020-10-31 (×3): qty 0.1

## 2020-10-31 MED ORDER — LACTATED RINGERS IV SOLN: INTRAVENOUS | Status: DC | PRN

## 2020-10-31 MED ORDER — METHOCARBAMOL 500 MG PO TABS
500.0000 mg | ORAL_TABLET | Freq: Three times a day (TID) | ORAL | Status: DC | PRN
  Filled 2020-10-31: qty 1

## 2020-10-31 MED ORDER — PROPOFOL 10 MG/ML IV BOLUS
INTRAVENOUS | Status: DC | PRN
  Administered 2020-10-31: 120 mg via INTRAVENOUS

## 2020-10-31 MED ORDER — JUVEN PO PACK
1.0000 | PACK | Freq: Two times a day (BID) | ORAL | Status: DC
  Administered 2020-10-31 – 2020-11-02 (×5): 1 via ORAL
  Filled 2020-10-31 (×5): qty 1

## 2020-10-31 MED ORDER — ENOXAPARIN SODIUM 40 MG/0.4ML ~~LOC~~ SOLN
40.0000 mg | SUBCUTANEOUS | Status: DC
  Administered 2020-11-01 – 2020-11-02 (×2): 40 mg via SUBCUTANEOUS
  Filled 2020-10-31 (×2): qty 0.4

## 2020-10-31 MED ORDER — ACETAMINOPHEN 325 MG PO TABS
325.0000 mg | ORAL_TABLET | Freq: Four times a day (QID) | ORAL | Status: DC | PRN
Start: 2020-10-31 — End: 2020-11-02

## 2020-10-31 MED ORDER — LIDOCAINE 2% (20 MG/ML) 5 ML SYRINGE
INTRAMUSCULAR | Status: DC | PRN
  Administered 2020-10-31: 80 mg via INTRAVENOUS

## 2020-10-31 MED ORDER — CEFAZOLIN SODIUM-DEXTROSE 2-4 GM/100ML-% IV SOLN
2.0000 g | Freq: Three times a day (TID) | INTRAVENOUS | Status: DC
  Administered 2020-10-31 (×2): 2 g via INTRAVENOUS
  Filled 2020-10-31 (×3): qty 100

## 2020-10-31 MED ORDER — ONDANSETRON HCL 4 MG/2ML IJ SOLN
INTRAMUSCULAR | Status: DC | PRN
  Administered 2020-10-31: 4 mg via INTRAVENOUS

## 2020-10-31 MED ORDER — HYDROCODONE-ACETAMINOPHEN 5-325 MG PO TABS
1.0000 | ORAL_TABLET | Freq: Four times a day (QID) | ORAL | Status: DC | PRN
  Administered 2020-10-31 – 2020-11-02 (×3): 2 via ORAL
  Filled 2020-10-31 (×4): qty 2

## 2020-10-31 MED ORDER — MIDAZOLAM HCL 2 MG/2ML IJ SOLN
INTRAMUSCULAR | Status: AC
  Filled 2020-10-31: qty 2

## 2020-10-31 MED ORDER — INSULIN DETEMIR 100 UNIT/ML ~~LOC~~ SOLN
10.0000 [IU] | Freq: Every day | SUBCUTANEOUS | Status: DC
  Filled 2020-10-31: qty 0.1

## 2020-10-31 MED ORDER — DEXTROSE IN LACTATED RINGERS 5 % IV SOLN: INTRAVENOUS | Status: DC

## 2020-10-31 MED ORDER — ENSURE MAX PROTEIN PO LIQD
11.0000 [oz_av] | Freq: Every day | ORAL | Status: DC
  Administered 2020-11-01 – 2020-11-02 (×2): 11 [oz_av] via ORAL
  Filled 2020-10-31 (×3): qty 330

## 2020-10-31 MED ORDER — FENTANYL CITRATE (PF) 250 MCG/5ML IJ SOLN
INTRAMUSCULAR | Status: DC | PRN
  Administered 2020-10-31 (×5): 50 ug via INTRAVENOUS
  Administered 2020-10-31: 25 ug via INTRAVENOUS
  Administered 2020-10-31: 50 ug via INTRAVENOUS

## 2020-10-31 MED ORDER — INSULIN ASPART 100 UNIT/ML ~~LOC~~ SOLN
0.0000 [IU] | Freq: Three times a day (TID) | SUBCUTANEOUS | Status: DC
  Administered 2020-10-31 – 2020-11-01 (×2): 2 [IU] via SUBCUTANEOUS
  Administered 2020-11-01: 5 [IU] via SUBCUTANEOUS
  Administered 2020-11-02: 8 [IU] via SUBCUTANEOUS
  Administered 2020-11-02: 2 [IU] via SUBCUTANEOUS

## 2020-10-31 MED ORDER — MORPHINE SULFATE (PF) 2 MG/ML IV SOLN
0.5000 mg | INTRAVENOUS | Status: DC | PRN
  Administered 2020-10-31: 0.5 mg via INTRAVENOUS
  Filled 2020-10-31: qty 1

## 2020-10-31 MED ORDER — SUGAMMADEX SODIUM 200 MG/2ML IV SOLN
INTRAVENOUS | Status: DC | PRN
  Administered 2020-10-31: 200 mg via INTRAVENOUS

## 2020-10-31 MED ORDER — MORPHINE SULFATE (PF) 2 MG/ML IV SOLN
2.0000 mg | INTRAVENOUS | Status: DC | PRN
  Administered 2020-10-31 – 2020-11-01 (×2): 2 mg via INTRAVENOUS
  Filled 2020-10-31 (×2): qty 1

## 2020-10-31 MED ORDER — SUMATRIPTAN SUCCINATE 50 MG PO TABS
50.0000 mg | ORAL_TABLET | ORAL | Status: DC | PRN
  Administered 2020-10-31: 50 mg via ORAL
  Filled 2020-10-31 (×2): qty 1

## 2020-10-31 MED ORDER — MEPERIDINE HCL 25 MG/ML IJ SOLN: 6.2500 mg | INTRAMUSCULAR | Status: DC | PRN

## 2020-10-31 MED ORDER — HYDROMORPHONE HCL 1 MG/ML IJ SOLN
0.2500 mg | INTRAMUSCULAR | Status: DC | PRN
  Administered 2020-10-31: 0.25 mg via INTRAVENOUS

## 2020-10-31 MED ORDER — ATORVASTATIN CALCIUM 10 MG PO TABS
10.0000 mg | ORAL_TABLET | Freq: Every day | ORAL | Status: DC
  Administered 2020-10-31 – 2020-11-02 (×3): 10 mg via ORAL
  Filled 2020-10-31 (×3): qty 1

## 2020-10-31 MED ORDER — DILTIAZEM HCL ER COATED BEADS 180 MG PO CP24
360.0000 mg | ORAL_CAPSULE | Freq: Every day | ORAL | Status: DC
  Administered 2020-10-31 – 2020-11-02 (×3): 360 mg via ORAL
  Filled 2020-10-31 (×3): qty 2

## 2020-10-31 MED ORDER — CHLORHEXIDINE GLUCONATE 0.12 % MT SOLN: 15.0000 mL | Freq: Once | OROMUCOSAL | Status: AC

## 2020-10-31 MED ORDER — CEFAZOLIN SODIUM-DEXTROSE 2-4 GM/100ML-% IV SOLN: 2.0000 g | Freq: Once | INTRAVENOUS | Status: DC

## 2020-10-31 MED ORDER — MAGNESIUM SULFATE 2 GM/50ML IV SOLN
2.0000 g | Freq: Once | INTRAVENOUS | Status: AC
  Administered 2020-10-31: 2 g via INTRAVENOUS
  Filled 2020-10-31: qty 50

## 2020-10-31 SURGICAL SUPPLY — 47 items
ADH SKN CLS APL DERMABOND .7 (GAUZE/BANDAGES/DRESSINGS) ×1
APL PRP STRL LF DISP 70% ISPRP (MISCELLANEOUS) ×1
BIT DRILL CANN LRG QC 5X300 (BIT) ×1 IMPLANT
BNDG COHESIVE 6X5 TAN STRL LF (GAUZE/BANDAGES/DRESSINGS) ×2 IMPLANT
BRUSH SCRUB EZ PLAIN DRY (MISCELLANEOUS) ×2 IMPLANT
CHLORAPREP W/TINT 26 (MISCELLANEOUS) ×2 IMPLANT
COVER SURGICAL LIGHT HANDLE (MISCELLANEOUS) ×3 IMPLANT
COVER WAND RF STERILE (DRAPES) ×2 IMPLANT
DERMABOND ADVANCED (GAUZE/BANDAGES/DRESSINGS) ×1
DERMABOND ADVANCED .7 DNX12 (GAUZE/BANDAGES/DRESSINGS) ×1 IMPLANT
DRAPE C-ARMOR (DRAPES) ×2 IMPLANT
DRAPE HALF SHEET 40X57 (DRAPES) ×4 IMPLANT
DRAPE IMP U-DRAPE 54X76 (DRAPES) ×4 IMPLANT
DRAPE ORTHO SPLIT 77X108 STRL (DRAPES) ×4
DRAPE STERI IOBAN 125X83 (DRAPES) ×2 IMPLANT
DRAPE SURG 17X23 STRL (DRAPES) ×2 IMPLANT
DRAPE SURG ORHT 6 SPLT 77X108 (DRAPES) ×2 IMPLANT
DRAPE U-SHAPE 47X51 STRL (DRAPES) ×2 IMPLANT
DRSG MEPILEX BORDER 4X4 (GAUZE/BANDAGES/DRESSINGS) ×2 IMPLANT
ELECT REM PT RETURN 9FT ADLT (ELECTROSURGICAL) ×2
ELECTRODE REM PT RTRN 9FT ADLT (ELECTROSURGICAL) ×1 IMPLANT
GLOVE BIO SURGEON STRL SZ 6.5 (GLOVE) ×6 IMPLANT
GLOVE BIO SURGEON STRL SZ7.5 (GLOVE) ×8 IMPLANT
GLOVE BIOGEL PI IND STRL 7.5 (GLOVE) ×1 IMPLANT
GLOVE BIOGEL PI INDICATOR 7.5 (GLOVE) ×1
GLOVE SURG UNDER POLY LF SZ6.5 (GLOVE) ×2 IMPLANT
GOWN STRL REUS W/ TWL LRG LVL3 (GOWN DISPOSABLE) ×2 IMPLANT
GOWN STRL REUS W/TWL LRG LVL3 (GOWN DISPOSABLE) ×4
GUIDEWIRE THREADED 2.8 (WIRE) ×4 IMPLANT
KIT BASIN OR (CUSTOM PROCEDURE TRAY) ×2 IMPLANT
KIT TURNOVER KIT B (KITS) ×2 IMPLANT
MANIFOLD NEPTUNE II (INSTRUMENTS) ×1 IMPLANT
NS IRRIG 1000ML POUR BTL (IV SOLUTION) ×2 IMPLANT
PACK GENERAL/GYN (CUSTOM PROCEDURE TRAY) ×2 IMPLANT
PAD ARMBOARD 7.5X6 YLW CONV (MISCELLANEOUS) ×4 IMPLANT
SCREW CANN 6.5X80 HEXAGONAL (Screw) ×1 IMPLANT
SCREW CANN F/THREAD 6.5X75 (Screw) ×1 IMPLANT
SCREW CANN FT 6.5X70 FT (Screw) ×1 IMPLANT
STAPLER VISISTAT 35W (STAPLE) ×2 IMPLANT
STOCKINETTE IMPERVIOUS LG (DRAPES) ×2 IMPLANT
SUT MNCRL AB 3-0 PS2 18 (SUTURE) ×2 IMPLANT
SUT VIC AB 2-0 CT1 27 (SUTURE) ×2
SUT VIC AB 2-0 CT1 TAPERPNT 27 (SUTURE) ×1 IMPLANT
TOWEL GREEN STERILE (TOWEL DISPOSABLE) ×3 IMPLANT
TOWEL GREEN STERILE FF (TOWEL DISPOSABLE) ×2 IMPLANT
WASHER FOR 5.0 SCREWS (Washer) ×1 IMPLANT
WATER STERILE IRR 1000ML POUR (IV SOLUTION) ×2 IMPLANT

## 2020-10-31 NOTE — Interval H&P Note (Signed)
History and Physical Interval Note:  10/31/2020 7:26 AM  [name]  has presented today for surgery, with the diagnosis of Left femoral neck fracture.  The various methods of treatment have been discussed with the patient and family. After consideration of risks, benefits and other options for treatment, the patient has consented to  Procedure(s): CANNULATED HIP PINNING (Left) as a surgical intervention.  The patient's history has been reviewed, patient examined, no change in status, stable for surgery.  I have reviewed the patient's chart and labs.  Questions were answered to the patient's satisfaction.     Lennette Bihari P Helaina Stefano

## 2020-10-31 NOTE — Progress Notes (Signed)
PROGRESS NOTE    [name]  NID:[id number] DOB: 1956-07-05 DOA: 10/30/2020 PCP: System, Provider Not In    Brief Narrative:  65 year old female with history of alcohol use, type 2 diabetes on insulin at home, non-small cell lung cancer in remission and status post lobectomy, hypertension presented to ER with left hip pain following a fall from bed at middle of the night.  She fell in the midnight, went to bed but continues to hurt and could not walk so came to ER.  In the emergency room, she was found to have impacted left femoral neck fracture. In the emergency room, she was also found to have anion gap metabolic acidosis and ketosis with hydroxybutyrate level of more than 8.  Blood glucose under 140.  Lactic normal.  pH 7.3.  Thoracic 9 and T10 compression fracture probably old. Patient was also noted to have mediastinal adenopathy, pulmonary nodule and pancreatic calcification.  Assessment & Plan:   Principal Problem:   Closed left hip fracture (HCC) Active Problems:   DM type 2 causing complication (HCC)   HTN (hypertension)   Alcohol abuse   Hx of cancer of lung   Ketoacidosis   Compression fracture of thoracic vertebra (HCC)  Closed traumatic left hip fracture: Status post ORIF Dr. Doreatha Martin 2/5 Start working with PT OT today. Pain controlled with oral pain medications, IV morphine as needed and Robaxin. Lovenox for DVT prophylaxis.  Planning for aspirin 325 mg daily for DVT prophylaxis on discharge. Postop wound care and follow-up as per surgery.  Diabetes type 2 on insulin, ketoacidosis: Probably due to combination of dehydration, acute renal failure: On presentation she had anion gap.  Treated with aggressive IV fluids and IV insulin with clinical improvement.  Blood sugars normal. Discontinue IV insulin. A1c 6.7. Start diabetic diet, subcu insulin.  T9 and T10 compression fractures: Probably chronic.  Continue mobility.  Hypertension: Blood pressure is stable.  Will  resume home medication including diltiazem.  Alcohol abuse: With evidence of chronic pancreatitis.  Patient is stated using alcohol after a month.  On CIWA scale.  Continue low-dose benzodiazepine.  On multivitamins.  Currently no evidence of withdrawal.  Abnormal CT scan of the chest. Once patient has clinical recovery, will do CT scan of the chest to look for any evidence of malignancy.  She does have history of non-small cell lung cancer resected in 2004. -Metastatic lymphadenopathy present since 2007, slightly increased in size.  Will check dedicated CT scan of the chest. -Pancreatic calcification present, if CT chest suspicious, will do CT scan abdomen pelvis.  Anxiety depression: Resume her home medications including SSRI.   DVT prophylaxis: enoxaparin (LOVENOX) injection 40 mg Start: 11/01/20 0900 SCDs Start: 10/30/20 2303 SCDs Start: 10/30/20 2259   Code Status: Full code Family Communication: None Disposition Plan: Status is: Inpatient  Remains inpatient appropriate because:Inpatient level of care appropriate due to severity of illness   Dispo: The patient is from: Home              Anticipated d/c is to: Home              Anticipated d/c date is: 2 days              Patient currently is not medically stable to d/c.   Difficult to place patient No         Consultants:   Orthopedics   Procedures:   ORIF left hip   Antimicrobials:   Cefazolin peri op.  Subjective: Patient seen and examined.  Came back from operating room.  Was complaining of some abdominal discomfort.  She has retained urine and palpable bladder. Pain in the left hip present.  Feels anxious.  Appetite is not good.   Objective: Vitals:   10/31/20 0930 10/31/20 0938 10/31/20 1000 10/31/20 1105  BP: (!) 151/79 (!) 161/94 (!) 160/77 (!) 170/91  Pulse: 75 92  97  Resp: 12 16  20   Temp:  (!) 97.5 F (36.4 C)  98.5 F (36.9 C)  TempSrc:  Oral  Oral  SpO2: 100% 100%  100%  Weight:       Height:        Intake/Output Summary (Last 24 hours) at 10/31/2020 1329 Last data filed at 10/31/2020 1306 Gross per 24 hour  Intake 2886.1 ml  Output 1150 ml  Net 1736.1 ml   Filed Weights   10/30/20 1142 10/30/20 2232  Weight: 68 kg 65.3 kg    Examination:  General exam: Appears mild discomfort and anxious. Respiratory system: Clear to auscultation. Respiratory effort normal.  No added sounds. Cardiovascular system: S1 & S2 heard, RRR. No JVD, murmurs, rubs, gallops or clicks. No pedal edema. Gastrointestinal system: soft.  Palpable urinary bladder. Central nervous system: Alert and oriented. No focal neurological deficits. Extremities: Symmetric 5 x 5 power. Skin: No rashes, lesions or ulcers Left hip lateral incision clean and dry.  Distal neurovascular status intact.    Data Reviewed: I have personally reviewed following labs and imaging studies  CBC: Recent Labs  Lab 10/30/20 1300 10/30/20 1506 10/31/20 0529  WBC 16.6*  --  9.2  NEUTROABS 14.3*  --   --   HGB 15.2* 14.6 14.1  HCT 45.0 43.0 43.0  MCV 92.4  --  94.3  PLT 229  --  [id number]   Basic Metabolic Panel: Recent Labs  Lab 10/30/20 1300 10/30/20 1506 10/30/20 2025 10/30/20 2314 10/31/20 0529  NA 137 138 139 140 139  K 4.1 4.0 4.1 4.8 3.8  CL 100  --  105 109 110  CO2 13*  --  14* 13* 15*  GLUCOSE 140*  --  140* 132* 156*  BUN 17  --  15 14 12   CREATININE 1.29*  --  1.04* 1.14* 1.02*  CALCIUM 9.5  --  8.9 8.8* 8.5*  MG  --   --   --  1.7  --   PHOS  --   --   --  2.6  --    GFR: Estimated Creatinine Clearance: 54.7 mL/min (A) (by C-G formula based on SCr of 1.02 mg/dL (H)). Liver Function Tests: Recent Labs  Lab 10/30/20 2314  AST 57*  ALT 44  ALKPHOS 110  BILITOT 2.1*  PROT 6.5  ALBUMIN 3.4*   No results for input(s): LIPASE, AMYLASE in the last 168 hours. No results for input(s): AMMONIA in the last 168 hours. Coagulation Profile: No results for input(s): INR, PROTIME in the last 168  hours. Cardiac Enzymes: No results for input(s): CKTOTAL, CKMB, CKMBINDEX, TROPONINI in the last 168 hours. BNP (last 3 results) No results for input(s): PROBNP in the last 8760 hours. HbA1C: Recent Labs    10/30/20 2314  HGBA1C 6.7*   CBG: Recent Labs  Lab 10/31/20 0715 10/31/20 0858 10/31/20 1005 10/31/20 1104 10/31/20 1238  GLUCAP 138* 157* 172* 165* 148*   Lipid Profile: No results for input(s): CHOL, HDL, LDLCALC, TRIG, CHOLHDL, LDLDIRECT in the last 72 hours. Thyroid Function Tests: No results for  input(s): TSH, T4TOTAL, FREET4, T3FREE, THYROIDAB in the last 72 hours. Anemia Panel: No results for input(s): VITAMINB12, FOLATE, FERRITIN, TIBC, IRON, RETICCTPCT in the last 72 hours. Sepsis Labs: Recent Labs  Lab 10/30/20 1455 10/30/20 1700  LATICACIDVEN 1.0 0.8    Recent Results (from the past 240 hour(s))  SARS Coronavirus 2 by RT PCR (hospital order, performed in [facility] hospital lab) Nasopharyngeal Nasopharyngeal Swab     Status: None   Collection Time: 10/30/20  1:01 PM   Specimen: Nasopharyngeal Swab  Result Value Ref Range Status   SARS Coronavirus 2 NEGATIVE NEGATIVE Final    Comment: (NOTE) SARS-CoV-2 target nucleic acids are NOT DETECTED.  The SARS-CoV-2 RNA is generally detectable in upper and lower respiratory specimens during the acute phase of infection. The lowest concentration of SARS-CoV-2 viral copies this assay can detect is 250 copies / mL. A negative result does not preclude SARS-CoV-2 infection and should not be used as the sole basis for treatment or other patient management decisions.  A negative result may occur with improper specimen collection / handling, submission of specimen other than nasopharyngeal swab, presence of viral mutation(s) within the areas targeted by this assay, and inadequate number of viral copies (<250 copies / mL). A negative result must be combined with clinical observations, patient history, and  epidemiological information.  Fact Sheet for Patients:   StrictlyIdeas.no  Fact Sheet for Healthcare Providers: BankingDealers.co.za  This test is not yet approved or  cleared by the [place] FDA and has been authorized for detection and/or diagnosis of SARS-CoV-2 by FDA under an Emergency Use Authorization (EUA).  This EUA will remain in effect (meaning this test can be used) for the duration of the COVID-19 declaration under Section 564(b)(1) of the Act, 21 U.S.C. section 360bbb-3(b)(1), unless the authorization is terminated or revoked sooner.  Performed at [facility], [address] [place], [place] [address]   Surgical PCR screen     Status: Abnormal   Collection Time: 10/31/20  1:08 AM   Specimen: Nasal Mucosa; Nasal Swab  Result Value Ref Range Status   MRSA, PCR NEGATIVE NEGATIVE Final   Staphylococcus aureus POSITIVE (A) NEGATIVE Final    Comment: (NOTE) The Xpert SA Assay (FDA approved for NASAL specimens in patients 8 years of age and older), is one component of a comprehensive surveillance program. It is not intended to diagnose infection nor to guide or monitor treatment. Performed at [facility], [place] [address] [place], [place] [address]          Radiology Studies: CT Thoracic Spine Wo Contrast  Result Date: 10/30/2020 CLINICAL DATA:  Back trauma.  Fell a bed with pain. EXAM: CT THORACIC SPINE WITHOUT CONTRAST TECHNIQUE: Multidetector CT images of the thoracic were obtained using the standard protocol without intravenous contrast. COMPARISON:  Chest radiograph 05/01/2013. FINDINGS: Alignment: Normal. Vertebrae: Compression deformities of the T9 and T10 vertebral bodies with approximately 40-50% height loss at T9 and 20% height loss at T10. There is no trabecular sclerosis, discrete fracture lucency, or substantial paraspinal soft tissue stranding. Otherwise, vertebral body heights are  maintained. Diffuse osteopenia. Paraspinal and other soft tissues: Small right pleural effusion with opacities in the medial aspect of the right lower lobe.There are two partially imaged 9 mm perifissural nodule in the the left upper lobe and lingula, which appear increased in size relative to prior from 2007. Mediastinal adenopathy. Partially imaged pancreatic calcifications, likely related to chronic pancreatitis. There is an  adjacent peripherally calcified 3.4 cm lesion along the tail the pancreas. Disc levels: Mild multilevel degenerative change without evidence of significant bony canal stenosis. IMPRESSION: 1. T9 and T10 compression deformities with approximately 40-50% height loss of T9 and 20% height loss of T10. These are favored remote given similar appearance on prior chest radiograph from 05/01/2013 and given no trabecular sclerosis, discrete fracture lucency or paraspinal edema. If the patient is focally tender, an MRI could further evaluate for marrow edema. 2. Small right pleural effusion with overlying medial right lower lobe opacities, which could represent atelectasis, aspiration, or pneumonia. 3. Mediastinal adenopathy and two partially imaged 9 mm perifissural nodule in the the left upper lobe and lingula. The nodules appear increased in size relative to prior from 2007. Recommend non urgent chest CT with contrast for further evaluation. 4. Partially imaged pancreatic calcifications, likely related to chronic pancreatitis. There is an adjacent peripherally calcified 3.4 cm lesion along the tail the pancreas. While this is favored to reflect a pseudocyst secondary to prior pancreatitis, it is incompletely evaluated. Recommend CT of the abdomen with contrast or MRI to further characterize and exclude solid component or aneurysm. Electronically Signed   By: Margaretha Sheffield MD   On: 10/30/2020 14:08   CT Hip Left Wo Contrast  Result Date: 10/30/2020 CLINICAL DATA:  Status post fall out of bed  last night. EXAM: CT OF THE LEFT HIP WITHOUT CONTRAST TECHNIQUE: Multidetector CT imaging of the left hip was performed according to the standard protocol. Multiplanar CT image reconstructions were also generated. COMPARISON:  None. FINDINGS: Bones/Joint/Cartilage Nondisplaced impacted left subcapital femoral neck fracture. No other acute fracture or dislocation. Normal alignment. No joint effusion. No aggressive osseous lesion. Ligaments Ligaments are suboptimally evaluated by CT. Muscles and Tendons Muscles are normal.  No intramuscular fluid collection or hematoma. Soft tissue No fluid collection or hematoma. No soft tissue mass. Mild soft tissue edema in the subcutaneous fat overlying the left hip. IMPRESSION: Acute nondisplaced impacted left subcapital femoral neck fracture. Electronically Signed   By: Kathreen Devoid   On: 10/30/2020 13:34   Chest Portable 1 View  Result Date: 10/30/2020 CLINICAL DATA:  Preop EXAM: PORTABLE CHEST 1 VIEW COMPARISON:  01/17/2020 FINDINGS: Small right pleural effusion versus pleural thickening. No confluent opacities. Heart is normal size. No acute bony abnormality visualized. IMPRESSION: Small right pleural effusion versus pleural thickening. Electronically Signed   By: Rolm Baptise M.D.   On: 10/30/2020 23:33   DG C-Arm 1-60 Min  Result Date: 10/31/2020 CLINICAL DATA:  Hip pinning. EXAM: DG C-ARM 1-60 MIN; LEFT FEMUR 2 VIEWS FLUOROSCOPY TIME:  Fluoroscopy Time:  1 minutes and 9 seconds Port radiation dose: 11.11 mGy COMPARISON:  CT hip October 30, 2020. FINDINGS: Eight C-arm fluoroscopic images were obtained intraoperatively and submitted for post operative interpretation. These images demonstrate pinning of the left subcapital femoral neck fracture with 3 screws. No unexpected findings. Please see the performing provider's procedural report for further detail. IMPRESSION: Intraoperative fluoroscopic imaging, as detailed above. Electronically Signed   By: Margaretha Sheffield  MD   On: 10/31/2020 09:40   DG HIP PORT UNILAT W OR W/O PELVIS 1V LEFT  Result Date: 10/31/2020 CLINICAL DATA:  Left hip pinning. EXAM: DG HIP (WITH OR WITHOUT PELVIS) 1V PORT LEFT COMPARISON:  October 30, 2020. FINDINGS: Status post surgical internal fixation of proximal left femoral neck fracture. Good alignment of the fracture components is noted. IMPRESSION: Status post surgical internal fixation of proximal left femoral neck  fracture. Electronically Signed   By: Marijo Conception M.D.   On: 10/31/2020 10:54   DG FEMUR MIN 2 VIEWS LEFT  Result Date: 10/31/2020 CLINICAL DATA:  Hip pinning. EXAM: DG C-ARM 1-60 MIN; LEFT FEMUR 2 VIEWS FLUOROSCOPY TIME:  Fluoroscopy Time:  1 minutes and 9 seconds Port radiation dose: 11.11 mGy COMPARISON:  CT hip October 30, 2020. FINDINGS: Eight C-arm fluoroscopic images were obtained intraoperatively and submitted for post operative interpretation. These images demonstrate pinning of the left subcapital femoral neck fracture with 3 screws. No unexpected findings. Please see the performing provider's procedural report for further detail. IMPRESSION: Intraoperative fluoroscopic imaging, as detailed above. Electronically Signed   By: Margaretha Sheffield MD   On: 10/31/2020 09:40   DG FEMUR MIN 2 VIEWS LEFT  Result Date: 10/30/2020 CLINICAL DATA:  Fall, left leg pain EXAM: LEFT FEMUR 2 VIEWS COMPARISON:  None. FINDINGS: Two view radiograph left femur demonstrates an impacted left subcapital femoral neck fracture, likely acute. The femoral head is still seated within the left acetabulum. Mild left hip degenerative arthritis. The remainder of the femur appears intact. No lytic or blastic bone lesion. Soft tissues are unremarkable. IMPRESSION: Suspected acute left subcapital femoral neck fracture. Correlation for point tenderness is recommended. This could be confirmed with CT imaging if physical examination findings are equivocal. Electronically Signed   By: Fidela Salisbury MD   On:  10/30/2020 12:38        Scheduled Meds: . atorvastatin  10 mg Oral Daily  . Chlorhexidine Gluconate Cloth  6 each Topical Daily  . diltiazem  360 mg Oral Daily  . DULoxetine  20 mg Oral Daily  . [START ON 11/01/2020] enoxaparin (LOVENOX) injection  40 mg Subcutaneous Q24H  . folic acid  1 mg Oral Daily  . insulin aspart  0-15 Units Subcutaneous TID WC  . insulin aspart  0-5 Units Subcutaneous QHS  . insulin detemir  10 Units Subcutaneous Daily  . multivitamin with minerals  1 tablet Oral Daily  . mupirocin ointment  1 application Nasal BID  . nutrition supplement (JUVEN)  1 packet Oral BID BM  . Ensure Max Protein  11 oz Oral Daily  . thiamine  100 mg Oral Daily   Or  . thiamine  100 mg Intravenous Daily   Continuous Infusions: .  ceFAZolin (ANCEF) IV    . lactated ringers 100 mL/hr at 10/31/20 1156     LOS: 1 day    Time spent: 35 minutes    Barb Merino, MD [facility] Pager [phone]

## 2020-10-31 NOTE — H&P (View-Only) (Signed)
Orthopaedic Trauma Service (OTS) Consult   Patient ID: [name] MRN: [id number] DOB/AGE: [birth year] 65 y.o.  Reason for Consult:Left femoral neck fracture Referring Physician: Dr. Phylliss Bob, MD [name]  HPI: [name] is an 65 y.o. female who is being seen in consultation at the request of Dr. Lynann Bologna for evaluation of left impacted femoral neck fracture.  Patient fell out of bed yesterday evening.  She had left hip pain and was unable to bear weight.  She presented to the emergency room where x-rays and CT scan showed a valgus impacted femoral neck fracture.  Dr. Lynann Bologna had asked that I take over care due to my expertise in orthopedic trauma.  Patient was transferred to [facility] overnight.  Patient was seen and evaluated in the preoperative holding area.  Currently having pain in her left hip.  Was incidentally found to have compression fractures of her spine for which she is in a TLSO.  The patient states that she has a history of diabetes and she is on insulin.  She does have neuropathy.  She ambulates without assist device at baseline.  She has three steps to enter her one-story home.  She lives with her husband.  She denies any other significant major medical problems.  She has a history of a cancer for which she is in remission.  Denies any numbness or tingling.  Denies any significant pain in her back bilateral upper extremities or right lower extremity.  Her left hip hurts with any attempted range of motion or weightbearing.  Past Medical History:  Diagnosis Date  . Alcohol abuse   . Anxiety   . Cancer ([place])    lung  . Diabetes mellitus   . Headache(784.0)   . Hypertension   . Pancreatitis, acute     Past Surgical History:  Procedure Laterality Date  . APPENDECTOMY    . CESAREAN SECTION    . JOINT REPLACEMENT     rt shoulder  . LUNG LOBECTOMY    . ORIF HUMERUS FRACTURE  04/13/2012   Procedure: OPEN REDUCTION INTERNAL FIXATION (ORIF)  PROXIMAL HUMERUS FRACTURE;  Surgeon: Augustin Schooling, MD;  Location: [place];  Service: Orthopedics;  Laterality: Left;  open reduction internal fixation left proximal humerus fracture  . SHOULDER SURGERY      Family History  Problem Relation Age of Onset  . Hypertension Mother   . COPD Father   . Diabetes Father     Social History:  reports that she has never smoked. She has never used smokeless tobacco. She reports current alcohol use. She reports that she does not use drugs.  Allergies:  Allergies  Allergen Reactions  . Ciprofloxacin Diarrhea    Medications:  No current facility-administered medications on file prior to encounter.   Current Outpatient Medications on File Prior to Encounter  Medication Sig Dispense Refill  . atorvastatin (LIPITOR) 10 MG tablet Take 10 mg by mouth daily.     . Candesartan Cilexetil-HCTZ 32-25 MG TABS Take 1 tablet by mouth every morning.    . diltiazem (CARDIZEM CD) 180 MG 24 hr capsule Take 180 mg by mouth daily.    . insulin aspart (NOVOLOG FLEXPEN) 100 UNIT/ML FlexPen Inject 0-10 Units into the skin 3 (three) times daily as needed for high blood sugar.    . Insulin Pen Needle 29G X 12MM MISC Dispense insulin pen needles 100 each 0  . metFORMIN (GLUCOPHAGE-XR) 500 MG 24 hr tablet Take 500 mg by mouth  daily with breakfast.     . TOUJEO SOLOSTAR 300 UNIT/ML Solostar Pen Inject 24 Units into the skin daily.     . vitamin B-12 (CYANOCOBALAMIN) 1000 MCG tablet Take 1,000 mcg by mouth daily.    . vitamin C (ASCORBIC ACID) 500 MG tablet Take 500 mg by mouth daily.      ROS: Constitutional: No fever or chills Vision: No changes in vision ENT: No difficulty swallowing CV: No chest pain Pulm: No SOB or wheezing GI: No nausea or vomiting GU: No urgency or inability to hold urine Skin: No poor wound healing Neurologic: No numbness or tingling Psychiatric: No depression or anxiety Heme: No bruising Allergic: No reaction to medications or  food   Exam: Blood pressure (!) 146/68, pulse 60, temperature 97.9 F (36.6 C), temperature source Oral, resp. rate 20, height 5\' 8"  (1.727 m), weight 65.3 kg, SpO2 98 %. General: No acute distress Orientation: Awake alert and oriented x3 Mood and Affect: Cooperative and pleasant Gait: Unable to assess secondary to pain Coordination and balance: Within normal limits  Left lower extremity: No leg length discrepancy.  Patient able to actively dorsiflex and plantarflex her foot and ankle.  She has sensation intact to light touch to her leg although it is somewhat diminished due to her diabetes.  She does have abrasions of her leg from her shaving her legs and her dry skin she states.  No instability about the knee.  Unable to tolerate significant range of motion of the left hip.  Right lower extremity: Skin without lesions. No tenderness to palpation. Full painless ROM, full strength in each muscle groups without evidence of instability.   Medical Decision Making: Data: Imaging: X-rays and CT scan are reviewed which shows a valgus impacted left femoral neck fracture no significant pre-existing arthritis.  Labs:  Results for orders placed or performed during the hospital encounter of 10/30/20 (from the past 24 hour(s))  Basic metabolic panel     Status: Abnormal   Collection Time: 10/30/20  1:00 PM  Result Value Ref Range   Sodium 137 135 - 145 mmol/L   Potassium 4.1 3.5 - 5.1 mmol/L   Chloride 100 98 - 111 mmol/L   CO2 13 (L) 22 - 32 mmol/L   Glucose, Bld 140 (H) 70 - 99 mg/dL   BUN 17 8 - 23 mg/dL   Creatinine, Ser 1.29 (H) 0.44 - 1.00 mg/dL   Calcium 9.5 8.9 - 10.3 mg/dL   GFR, Estimated 46 (L) >60 mL/min   Anion gap 24 (H) 5 - 15  CBC with Differential     Status: Abnormal   Collection Time: 10/30/20  1:00 PM  Result Value Ref Range   WBC 16.6 (H) 4.0 - 10.5 K/uL   RBC 4.87 3.87 - 5.11 MIL/uL   Hemoglobin 15.2 (H) 12.0 - 15.0 g/dL   HCT 45.0 36.0 - 46.0 %   MCV 92.4 80.0 -  100.0 fL   MCH 31.2 26.0 - 34.0 pg   MCHC 33.8 30.0 - 36.0 g/dL   RDW 16.1 (H) 11.5 - 15.5 %   Platelets 229 150 - 400 K/uL   nRBC 0.0 0.0 - 0.2 %   Neutrophils Relative % 86 %   Neutro Abs 14.3 (H) 1.7 - 7.7 K/uL   Lymphocytes Relative 7 %   Lymphs Abs 1.1 0.7 - 4.0 K/uL   Monocytes Relative 7 %   Monocytes Absolute 1.2 (H) 0.1 - 1.0 K/uL   Eosinophils Relative 0 %  Eosinophils Absolute 0.0 0.0 - 0.5 K/uL   Basophils Relative 0 %   Basophils Absolute 0.1 0.0 - 0.1 K/uL   Immature Granulocytes 0 %   Abs Immature Granulocytes 0.06 0.00 - 0.07 K/uL  SARS Coronavirus 2 by RT PCR (hospital order, performed in [place] hospital lab) Nasopharyngeal Nasopharyngeal Swab     Status: None   Collection Time: 10/30/20  1:01 PM   Specimen: Nasopharyngeal Swab  Result Value Ref Range   SARS Coronavirus 2 NEGATIVE NEGATIVE  Beta-hydroxybutyric acid     Status: Abnormal   Collection Time: 10/30/20  2:55 PM  Result Value Ref Range   Beta-Hydroxybutyric Acid >8.00 (H) 0.05 - 0.27 mmol/L  Lactic acid, plasma     Status: None   Collection Time: 10/30/20  2:55 PM  Result Value Ref Range   Lactic Acid, Venous 1.0 0.5 - 1.9 mmol/L  I-Stat venous blood gas, ([place] [facility])     Status: Abnormal   Collection Time: 10/30/20  3:06 PM  Result Value Ref Range   pH, Ven 7.306 7.250 - 7.430   pCO2, Ven 30.4 (L) 44.0 - 60.0 mmHg   pO2, Ven 48.0 (H) 32.0 - 45.0 mmHg   Bicarbonate 15.2 (L) 20.0 - 28.0 mmol/L   TCO2 16 (L) 22 - 32 mmol/L   O2 Saturation 80.0 %   Acid-base deficit 10.0 (H) 0.0 - 2.0 mmol/L   Sodium 138 135 - 145 mmol/L   Potassium 4.0 3.5 - 5.1 mmol/L   Calcium, Ion 1.21 1.15 - 1.40 mmol/L   HCT 43.0 36.0 - 46.0 %   Hemoglobin 14.6 12.0 - 15.0 g/dL   Collection site IV start    Drawn by Nurse    Sample type VENOUS   Lactic acid, plasma     Status: None   Collection Time: 10/30/20  5:00 PM  Result Value Ref Range   Lactic Acid, Venous 0.8 0.5 - 1.9 mmol/L  CBG monitoring, ED     Status:  Abnormal   Collection Time: 10/30/20  7:58 PM  Result Value Ref Range   Glucose-Capillary 129 (H) 70 - 99 mg/dL  Basic metabolic panel     Status: Abnormal   Collection Time: 10/30/20  8:25 PM  Result Value Ref Range   Sodium 139 135 - 145 mmol/L   Potassium 4.1 3.5 - 5.1 mmol/L   Chloride 105 98 - 111 mmol/L   CO2 14 (L) 22 - 32 mmol/L   Glucose, Bld 140 (H) 70 - 99 mg/dL   BUN 15 8 - 23 mg/dL   Creatinine, Ser 1.04 (H) 0.44 - 1.00 mg/dL   Calcium 8.9 8.9 - 10.3 mg/dL   GFR, Estimated 59 (L) >60 mL/min   Anion gap 20 (H) 5 - 15  Glucose, capillary     Status: Abnormal   Collection Time: 10/30/20 10:44 PM  Result Value Ref Range   Glucose-Capillary 124 (H) 70 - 99 mg/dL  Basic metabolic panel     Status: Abnormal   Collection Time: 10/30/20 11:14 PM  Result Value Ref Range   Sodium 140 135 - 145 mmol/L   Potassium 4.8 3.5 - 5.1 mmol/L   Chloride 109 98 - 111 mmol/L   CO2 13 (L) 22 - 32 mmol/L   Glucose, Bld 132 (H) 70 - 99 mg/dL   BUN 14 8 - 23 mg/dL   Creatinine, Ser 1.14 (H) 0.44 - 1.00 mg/dL   Calcium 8.8 (L) 8.9 - 10.3 mg/dL   GFR,  Estimated 53 (L) >60 mL/min   Anion gap 18 (H) 5 - 15  Beta-hydroxybutyric acid     Status: Abnormal   Collection Time: 10/30/20 11:14 PM  Result Value Ref Range   Beta-Hydroxybutyric Acid 7.19 (H) 0.05 - 0.27 mmol/L  Hemoglobin A1c     Status: Abnormal   Collection Time: 10/30/20 11:14 PM  Result Value Ref Range   Hgb A1c MFr Bld 6.7 (H) 4.8 - 5.6 %   Mean Plasma Glucose 145.59 mg/dL  Magnesium     Status: None   Collection Time: 10/30/20 11:14 PM  Result Value Ref Range   Magnesium 1.7 1.7 - 2.4 mg/dL  Phosphorus     Status: None   Collection Time: 10/30/20 11:14 PM  Result Value Ref Range   Phosphorus 2.6 2.5 - 4.6 mg/dL  Hepatic function panel     Status: Abnormal   Collection Time: 10/30/20 11:14 PM  Result Value Ref Range   Total Protein 6.5 6.5 - 8.1 g/dL   Albumin 3.4 (L) 3.5 - 5.0 g/dL   AST 57 (H) 15 - 41 U/L   ALT 44  0 - 44 U/L   Alkaline Phosphatase 110 38 - 126 U/L   Total Bilirubin 2.1 (H) 0.3 - 1.2 mg/dL   Bilirubin, Direct 0.4 (H) 0.0 - 0.2 mg/dL   Indirect Bilirubin 1.7 (H) 0.3 - 0.9 mg/dL  Type and screen [place]     Status: None   Collection Time: 10/30/20 11:24 PM  Result Value Ref Range   ABO/RH(D) A POS    Antibody Screen NEG    Sample Expiration      11/02/2020,2359 Performed at [facility], [place] [address] [place], [place] [address]   Glucose, capillary     Status: Abnormal   Collection Time: 10/31/20 12:06 AM  Result Value Ref Range   Glucose-Capillary 134 (H) 70 - 99 mg/dL  Glucose, capillary     Status: Abnormal   Collection Time: 10/31/20  1:03 AM  Result Value Ref Range   Glucose-Capillary 135 (H) 70 - 99 mg/dL  Surgical PCR screen     Status: Abnormal   Collection Time: 10/31/20  1:08 AM   Specimen: Nasal Mucosa; Nasal Swab  Result Value Ref Range   MRSA, PCR NEGATIVE NEGATIVE   Staphylococcus aureus POSITIVE (A) NEGATIVE  Glucose, capillary     Status: Abnormal   Collection Time: 10/31/20  2:04 AM  Result Value Ref Range   Glucose-Capillary 146 (H) 70 - 99 mg/dL  Glucose, capillary     Status: Abnormal   Collection Time: 10/31/20  3:07 AM  Result Value Ref Range   Glucose-Capillary 138 (H) 70 - 99 mg/dL  Glucose, capillary     Status: Abnormal   Collection Time: 10/31/20  3:58 AM  Result Value Ref Range   Glucose-Capillary 138 (H) 70 - 99 mg/dL  Glucose, capillary     Status: Abnormal   Collection Time: 10/31/20  5:02 AM  Result Value Ref Range   Glucose-Capillary 158 (H) 70 - 99 mg/dL  Basic metabolic panel     Status: Abnormal   Collection Time: 10/31/20  5:29 AM  Result Value Ref Range   Sodium 139 135 - 145 mmol/L   Potassium 3.8 3.5 - 5.1 mmol/L   Chloride 110 98 - 111 mmol/L   CO2 15 (L) 22 - 32 mmol/L   Glucose, Bld 156 (H) 70 - 99 mg/dL   BUN 12 8 -  23 mg/dL   Creatinine, Ser 1.02 (H) 0.44 - 1.00 mg/dL   Calcium 8.5 (L)  8.9 - 10.3 mg/dL   GFR, Estimated >60 >60 mL/min   Anion gap 14 5 - 15  CBC     Status: Abnormal   Collection Time: 10/31/20  5:29 AM  Result Value Ref Range   WBC 9.2 4.0 - 10.5 K/uL   RBC 4.56 3.87 - 5.11 MIL/uL   Hemoglobin 14.1 12.0 - 15.0 g/dL   HCT 43.0 36.0 - 46.0 %   MCV 94.3 80.0 - 100.0 fL   MCH 30.9 26.0 - 34.0 pg   MCHC 32.8 30.0 - 36.0 g/dL   RDW 16.3 (H) 11.5 - 15.5 %   Platelets 161 150 - 400 K/uL   nRBC 0.0 0.0 - 0.2 %  Beta-hydroxybutyric acid     Status: Abnormal   Collection Time: 10/31/20  5:29 AM  Result Value Ref Range   Beta-Hydroxybutyric Acid 3.72 (H) 0.05 - 0.27 mmol/L  Glucose, capillary     Status: Abnormal   Collection Time: 10/31/20  6:02 AM  Result Value Ref Range   Glucose-Capillary 143 (H) 70 - 99 mg/dL  Glucose, capillary     Status: Abnormal   Collection Time: 10/31/20  7:15 AM  Result Value Ref Range   Glucose-Capillary 138 (H) 70 - 99 mg/dL    Imaging or Labs ordered: None  Medical history and chart was reviewed and case discussed with medical provider.  Assessment/Plan: 65 year old female with a history of type 2 diabetes on insulin with hypertension that presents with a left valgus impacted femoral neck fracture.  I feel that she is indicated for percutaneous fixation of her left femoral neck.  I discussed risks and benefits with the patient.  Risks included but not limited to bleeding, infection, malunion, nonunion, hardware failure, hardware irritation, nerve and blood vessel injury, posttraumatic arthritis, need for hip arthroplasty, DVT, even the possibility of anesthetic complications.  Patient agreed to proceed with surgery.  I discussed this over the phone with her husband as well.  All questions were answered to the patient and her husband satisfaction.  Shona Needles, MD [facility] [phone] (office) [email or web address]

## 2020-10-31 NOTE — Consult Note (Signed)
Orthopaedic Trauma Service (OTS) Consult   Patient ID: [name] MRN: [id number] DOB/AGE: Apr 18, 1956 65 y.o.  Reason for Consult:Left femoral neck fracture Referring Physician: Dr. Phylliss Bob, MD [name]  HPI: [name] is an 65 y.o. female who is being seen in consultation at the request of Dr. Lynann Bologna for evaluation of left impacted femoral neck fracture.  Patient fell out of bed yesterday evening.  She had left hip pain and was unable to bear weight.  She presented to the emergency room where x-rays and CT scan showed a valgus impacted femoral neck fracture.  Dr. Lynann Bologna had asked that I take over care due to my expertise in orthopedic trauma.  Patient was transferred to [facility] overnight.  Patient was seen and evaluated in the preoperative holding area.  Currently having pain in her left hip.  Was incidentally found to have compression fractures of her spine for which she is in a TLSO.  The patient states that she has a history of diabetes and she is on insulin.  She does have neuropathy.  She ambulates without assist device at baseline.  She has three steps to enter her one-story home.  She lives with her husband.  She denies any other significant major medical problems.  She has a history of a cancer for which she is in remission.  Denies any numbness or tingling.  Denies any significant pain in her back bilateral upper extremities or right lower extremity.  Her left hip hurts with any attempted range of motion or weightbearing.  Past Medical History:  Diagnosis Date  . Alcohol abuse   . Anxiety   . Cancer ([place])    lung  . Diabetes mellitus   . Headache(784.0)   . Hypertension   . Pancreatitis, acute     Past Surgical History:  Procedure Laterality Date  . APPENDECTOMY    . CESAREAN SECTION    . JOINT REPLACEMENT     rt shoulder  . LUNG LOBECTOMY    . ORIF HUMERUS FRACTURE  04/13/2012   Procedure: OPEN REDUCTION INTERNAL FIXATION (ORIF)  PROXIMAL HUMERUS FRACTURE;  Surgeon: Augustin Schooling, MD;  Location: [facility];  Service: Orthopedics;  Laterality: Left;  open reduction internal fixation left proximal humerus fracture  . SHOULDER SURGERY      Family History  Problem Relation Age of Onset  . Hypertension Mother   . COPD Father   . Diabetes Father     Social History:  reports that she has never smoked. She has never used smokeless tobacco. She reports current alcohol use. She reports that she does not use drugs.  Allergies:  Allergies  Allergen Reactions  . Ciprofloxacin Diarrhea    Medications:  No current facility-administered medications on file prior to encounter.   Current Outpatient Medications on File Prior to Encounter  Medication Sig Dispense Refill  . atorvastatin (LIPITOR) 10 MG tablet Take 10 mg by mouth daily.     . Candesartan Cilexetil-HCTZ 32-25 MG TABS Take 1 tablet by mouth every morning.    . diltiazem (CARDIZEM CD) 180 MG 24 hr capsule Take 180 mg by mouth daily.    . insulin aspart (NOVOLOG FLEXPEN) 100 UNIT/ML FlexPen Inject 0-10 Units into the skin 3 (three) times daily as needed for high blood sugar.    . Insulin Pen Needle 29G X 12MM MISC Dispense insulin pen needles 100 each 0  . metFORMIN (GLUCOPHAGE-XR) 500 MG 24 hr tablet Take 500 mg by mouth  daily with breakfast.     . TOUJEO SOLOSTAR 300 UNIT/ML Solostar Pen Inject 24 Units into the skin daily.     . vitamin B-12 (CYANOCOBALAMIN) 1000 MCG tablet Take 1,000 mcg by mouth daily.    . vitamin C (ASCORBIC ACID) 500 MG tablet Take 500 mg by mouth daily.      ROS: Constitutional: No fever or chills Vision: No changes in vision ENT: No difficulty swallowing CV: No chest pain Pulm: No SOB or wheezing GI: No nausea or vomiting GU: No urgency or inability to hold urine Skin: No poor wound healing Neurologic: No numbness or tingling Psychiatric: No depression or anxiety Heme: No bruising Allergic: No reaction to medications or  food   Exam: Blood pressure (!) 146/68, pulse 60, temperature 97.9 F (36.6 C), temperature source Oral, resp. rate 20, height 5\' 8"  (1.727 m), weight 65.3 kg, SpO2 98 %. General: No acute distress Orientation: Awake alert and oriented x3 Mood and Affect: Cooperative and pleasant Gait: Unable to assess secondary to pain Coordination and balance: Within normal limits  Left lower extremity: No leg length discrepancy.  Patient able to actively dorsiflex and plantarflex her foot and ankle.  She has sensation intact to light touch to her leg although it is somewhat diminished due to her diabetes.  She does have abrasions of her leg from her shaving her legs and her dry skin she states.  No instability about the knee.  Unable to tolerate significant range of motion of the left hip.  Right lower extremity: Skin without lesions. No tenderness to palpation. Full painless ROM, full strength in each muscle groups without evidence of instability.   Medical Decision Making: Data: Imaging: X-rays and CT scan are reviewed which shows a valgus impacted left femoral neck fracture no significant pre-existing arthritis.  Labs:  Results for orders placed or performed during the hospital encounter of 10/30/20 (from the past 24 hour(s))  Basic metabolic panel     Status: Abnormal   Collection Time: 10/30/20  1:00 PM  Result Value Ref Range   Sodium 137 135 - 145 mmol/L   Potassium 4.1 3.5 - 5.1 mmol/L   Chloride 100 98 - 111 mmol/L   CO2 13 (L) 22 - 32 mmol/L   Glucose, Bld 140 (H) 70 - 99 mg/dL   BUN 17 8 - 23 mg/dL   Creatinine, Ser 1.29 (H) 0.44 - 1.00 mg/dL   Calcium 9.5 8.9 - 10.3 mg/dL   GFR, Estimated 46 (L) >60 mL/min   Anion gap 24 (H) 5 - 15  CBC with Differential     Status: Abnormal   Collection Time: 10/30/20  1:00 PM  Result Value Ref Range   WBC 16.6 (H) 4.0 - 10.5 K/uL   RBC 4.87 3.87 - 5.11 MIL/uL   Hemoglobin 15.2 (H) 12.0 - 15.0 g/dL   HCT 45.0 36.0 - 46.0 %   MCV 92.4 80.0 -  100.0 fL   MCH 31.2 26.0 - 34.0 pg   MCHC 33.8 30.0 - 36.0 g/dL   RDW 16.1 (H) 11.5 - 15.5 %   Platelets 229 150 - 400 K/uL   nRBC 0.0 0.0 - 0.2 %   Neutrophils Relative % 86 %   Neutro Abs 14.3 (H) 1.7 - 7.7 K/uL   Lymphocytes Relative 7 %   Lymphs Abs 1.1 0.7 - 4.0 K/uL   Monocytes Relative 7 %   Monocytes Absolute 1.2 (H) 0.1 - 1.0 K/uL   Eosinophils Relative 0 %  Eosinophils Absolute 0.0 0.0 - 0.5 K/uL   Basophils Relative 0 %   Basophils Absolute 0.1 0.0 - 0.1 K/uL   Immature Granulocytes 0 %   Abs Immature Granulocytes 0.06 0.00 - 0.07 K/uL  SARS Coronavirus 2 by RT PCR (hospital order, performed in [place] hospital lab) Nasopharyngeal Nasopharyngeal Swab     Status: None   Collection Time: 10/30/20  1:01 PM   Specimen: Nasopharyngeal Swab  Result Value Ref Range   SARS Coronavirus 2 NEGATIVE NEGATIVE  Beta-hydroxybutyric acid     Status: Abnormal   Collection Time: 10/30/20  2:55 PM  Result Value Ref Range   Beta-Hydroxybutyric Acid >8.00 (H) 0.05 - 0.27 mmol/L  Lactic acid, plasma     Status: None   Collection Time: 10/30/20  2:55 PM  Result Value Ref Range   Lactic Acid, Venous 1.0 0.5 - 1.9 mmol/L  I-Stat venous blood gas, ([place] [facility])     Status: Abnormal   Collection Time: 10/30/20  3:06 PM  Result Value Ref Range   pH, Ven 7.306 7.250 - 7.430   pCO2, Ven 30.4 (L) 44.0 - 60.0 mmHg   pO2, Ven 48.0 (H) 32.0 - 45.0 mmHg   Bicarbonate 15.2 (L) 20.0 - 28.0 mmol/L   TCO2 16 (L) 22 - 32 mmol/L   O2 Saturation 80.0 %   Acid-base deficit 10.0 (H) 0.0 - 2.0 mmol/L   Sodium 138 135 - 145 mmol/L   Potassium 4.0 3.5 - 5.1 mmol/L   Calcium, Ion 1.21 1.15 - 1.40 mmol/L   HCT 43.0 36.0 - 46.0 %   Hemoglobin 14.6 12.0 - 15.0 g/dL   Collection site IV start    Drawn by Nurse    Sample type VENOUS   Lactic acid, plasma     Status: None   Collection Time: 10/30/20  5:00 PM  Result Value Ref Range   Lactic Acid, Venous 0.8 0.5 - 1.9 mmol/L  CBG monitoring, ED     Status:  Abnormal   Collection Time: 10/30/20  7:58 PM  Result Value Ref Range   Glucose-Capillary 129 (H) 70 - 99 mg/dL  Basic metabolic panel     Status: Abnormal   Collection Time: 10/30/20  8:25 PM  Result Value Ref Range   Sodium 139 135 - 145 mmol/L   Potassium 4.1 3.5 - 5.1 mmol/L   Chloride 105 98 - 111 mmol/L   CO2 14 (L) 22 - 32 mmol/L   Glucose, Bld 140 (H) 70 - 99 mg/dL   BUN 15 8 - 23 mg/dL   Creatinine, Ser 1.04 (H) 0.44 - 1.00 mg/dL   Calcium 8.9 8.9 - 10.3 mg/dL   GFR, Estimated 59 (L) >60 mL/min   Anion gap 20 (H) 5 - 15  Glucose, capillary     Status: Abnormal   Collection Time: 10/30/20 10:44 PM  Result Value Ref Range   Glucose-Capillary 124 (H) 70 - 99 mg/dL  Basic metabolic panel     Status: Abnormal   Collection Time: 10/30/20 11:14 PM  Result Value Ref Range   Sodium 140 135 - 145 mmol/L   Potassium 4.8 3.5 - 5.1 mmol/L   Chloride 109 98 - 111 mmol/L   CO2 13 (L) 22 - 32 mmol/L   Glucose, Bld 132 (H) 70 - 99 mg/dL   BUN 14 8 - 23 mg/dL   Creatinine, Ser 1.14 (H) 0.44 - 1.00 mg/dL   Calcium 8.8 (L) 8.9 - 10.3 mg/dL   GFR,  Estimated 53 (L) >60 mL/min   Anion gap 18 (H) 5 - 15  Beta-hydroxybutyric acid     Status: Abnormal   Collection Time: 10/30/20 11:14 PM  Result Value Ref Range   Beta-Hydroxybutyric Acid 7.19 (H) 0.05 - 0.27 mmol/L  Hemoglobin A1c     Status: Abnormal   Collection Time: 10/30/20 11:14 PM  Result Value Ref Range   Hgb A1c MFr Bld 6.7 (H) 4.8 - 5.6 %   Mean Plasma Glucose 145.59 mg/dL  Magnesium     Status: None   Collection Time: 10/30/20 11:14 PM  Result Value Ref Range   Magnesium 1.7 1.7 - 2.4 mg/dL  Phosphorus     Status: None   Collection Time: 10/30/20 11:14 PM  Result Value Ref Range   Phosphorus 2.6 2.5 - 4.6 mg/dL  Hepatic function panel     Status: Abnormal   Collection Time: 10/30/20 11:14 PM  Result Value Ref Range   Total Protein 6.5 6.5 - 8.1 g/dL   Albumin 3.4 (L) 3.5 - 5.0 g/dL   AST 57 (H) 15 - 41 U/L   ALT 44  0 - 44 U/L   Alkaline Phosphatase 110 38 - 126 U/L   Total Bilirubin 2.1 (H) 0.3 - 1.2 mg/dL   Bilirubin, Direct 0.4 (H) 0.0 - 0.2 mg/dL   Indirect Bilirubin 1.7 (H) 0.3 - 0.9 mg/dL  Type and screen [place]     Status: None   Collection Time: 10/30/20 11:24 PM  Result Value Ref Range   ABO/RH(D) A POS    Antibody Screen NEG    Sample Expiration      11/02/2020,2359 Performed at [facility], [place] [address] [place], [place] [address]   Glucose, capillary     Status: Abnormal   Collection Time: 10/31/20 12:06 AM  Result Value Ref Range   Glucose-Capillary 134 (H) 70 - 99 mg/dL  Glucose, capillary     Status: Abnormal   Collection Time: 10/31/20  1:03 AM  Result Value Ref Range   Glucose-Capillary 135 (H) 70 - 99 mg/dL  Surgical PCR screen     Status: Abnormal   Collection Time: 10/31/20  1:08 AM   Specimen: Nasal Mucosa; Nasal Swab  Result Value Ref Range   MRSA, PCR NEGATIVE NEGATIVE   Staphylococcus aureus POSITIVE (A) NEGATIVE  Glucose, capillary     Status: Abnormal   Collection Time: 10/31/20  2:04 AM  Result Value Ref Range   Glucose-Capillary 146 (H) 70 - 99 mg/dL  Glucose, capillary     Status: Abnormal   Collection Time: 10/31/20  3:07 AM  Result Value Ref Range   Glucose-Capillary 138 (H) 70 - 99 mg/dL  Glucose, capillary     Status: Abnormal   Collection Time: 10/31/20  3:58 AM  Result Value Ref Range   Glucose-Capillary 138 (H) 70 - 99 mg/dL  Glucose, capillary     Status: Abnormal   Collection Time: 10/31/20  5:02 AM  Result Value Ref Range   Glucose-Capillary 158 (H) 70 - 99 mg/dL  Basic metabolic panel     Status: Abnormal   Collection Time: 10/31/20  5:29 AM  Result Value Ref Range   Sodium 139 135 - 145 mmol/L   Potassium 3.8 3.5 - 5.1 mmol/L   Chloride 110 98 - 111 mmol/L   CO2 15 (L) 22 - 32 mmol/L   Glucose, Bld 156 (H) 70 - 99 mg/dL   BUN 12 8 -  23 mg/dL   Creatinine, Ser 1.02 (H) 0.44 - 1.00 mg/dL   Calcium 8.5 (L)  8.9 - 10.3 mg/dL   GFR, Estimated >60 >60 mL/min   Anion gap 14 5 - 15  CBC     Status: Abnormal   Collection Time: 10/31/20  5:29 AM  Result Value Ref Range   WBC 9.2 4.0 - 10.5 K/uL   RBC 4.56 3.87 - 5.11 MIL/uL   Hemoglobin 14.1 12.0 - 15.0 g/dL   HCT 43.0 36.0 - 46.0 %   MCV 94.3 80.0 - 100.0 fL   MCH 30.9 26.0 - 34.0 pg   MCHC 32.8 30.0 - 36.0 g/dL   RDW 16.3 (H) 11.5 - 15.5 %   Platelets 161 150 - 400 K/uL   nRBC 0.0 0.0 - 0.2 %  Beta-hydroxybutyric acid     Status: Abnormal   Collection Time: 10/31/20  5:29 AM  Result Value Ref Range   Beta-Hydroxybutyric Acid 3.72 (H) 0.05 - 0.27 mmol/L  Glucose, capillary     Status: Abnormal   Collection Time: 10/31/20  6:02 AM  Result Value Ref Range   Glucose-Capillary 143 (H) 70 - 99 mg/dL  Glucose, capillary     Status: Abnormal   Collection Time: 10/31/20  7:15 AM  Result Value Ref Range   Glucose-Capillary 138 (H) 70 - 99 mg/dL    Imaging or Labs ordered: None  Medical history and chart was reviewed and case discussed with medical provider.  Assessment/Plan: 65 year old female with a history of type 2 diabetes on insulin with hypertension that presents with a left valgus impacted femoral neck fracture.  I feel that she is indicated for percutaneous fixation of her left femoral neck.  I discussed risks and benefits with the patient.  Risks included but not limited to bleeding, infection, malunion, nonunion, hardware failure, hardware irritation, nerve and blood vessel injury, posttraumatic arthritis, need for hip arthroplasty, DVT, even the possibility of anesthetic complications.  Patient agreed to proceed with surgery.  I discussed this over the phone with her husband as well.  All questions were answered to the patient and her husband satisfaction.  Shona Needles, MD [facility] [phone] (office) [email or web address]

## 2020-10-31 NOTE — Anesthesia Procedure Notes (Addendum)
Procedure Name: Intubation Date/Time: 10/31/2020 7:52 AM Performed by: Thelma Comp, CRNA Pre-anesthesia Checklist: Patient identified, Emergency Drugs available, Suction available and Patient being monitored Patient Re-evaluated:Patient Re-evaluated prior to induction Oxygen Delivery Method: Circle system utilized Preoxygenation: Pre-oxygenation with 100% oxygen Induction Type: IV induction Ventilation: Mask ventilation without difficulty Laryngoscope Size: Mac and 4 Grade View: Grade I Tube type: Oral Tube size: 7.0 mm Number of attempts: 1 Airway Equipment and Method: Stylet and Oral airway Placement Confirmation: ETT inserted through vocal cords under direct vision,  positive ETCO2 and breath sounds checked- equal and bilateral Secured at: 23 cm Tube secured with: Tape Dental Injury: Teeth and Oropharynx as per pre-operative assessment

## 2020-10-31 NOTE — OR Nursing (Signed)
Patient arrived in OR 5 wearing back brace. Dr. Doreatha Martin consulted about removing brace. Dr. Doreatha Martin stated it was ok to remove back brace. All this was discussed prior to moving patient to OR table for surgical procedure.

## 2020-10-31 NOTE — Anesthesia Postprocedure Evaluation (Signed)
Anesthesia Post Note  Patient: [name]  Procedure(s) Performed: CANNULATED HIP PINNING (Left Hip)     Patient location during evaluation: PACU Anesthesia Type: General Level of consciousness: sedated and patient cooperative Pain management: pain level controlled Vital Signs Assessment: post-procedure vital signs reviewed and stable Respiratory status: spontaneous breathing Cardiovascular status: stable Anesthetic complications: no   No complications documented.  Last Vitals:  Vitals:   10/31/20 1000 10/31/20 1105  BP: (!) 160/77 (!) 170/91  Pulse:  97  Resp:  20  Temp:  36.9 C  SpO2:  100%    Last Pain:  Vitals:   10/31/20 1105  TempSrc: Oral  PainSc:                  Nolon Nations

## 2020-10-31 NOTE — Evaluation (Signed)
Physical Therapy Evaluation Patient Details Name: [name] MRN: [id number] DOB: October 10, 1955 Today's Date: 10/31/2020   History of Present Illness  65 y.o. 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 Patient found to have L femoral neck fx and T9-10 compression fxs. Patient s/p L hip pinning on 2/5.  Clinical Impression  PTA, patient lives with husband and independent with mobility. Educated patient on back precautions with difficulty maintaining with cues due to significant hip pain. Patient currently requires minA for bed mobility and modA for sit to stand transfer. Patient ambulated 25' with RW and min guard for safety. Patient presents with impaired balance, generalized weakness, decreased activity tolerance, and impaired functional mobility. Patient will benefit from skilled PT services during acute stay to address listed deficits. Recommend HHPT following discharge to maximize functional mobility and safety in the home.      Follow Up Recommendations Home health PT;Supervision for mobility/OOB    Equipment Recommendations  Rolling Avelynn Sellin with 5" wheels;3in1 (PT)    Recommendations for Other Services       Precautions / Restrictions Precautions Precautions: Fall;Back Precaution Booklet Issued: No Precaution Comments: verbally reviewed back precautions, difficulty maintaining even with cueing due to significant hip pain Required Braces or Orthoses: Spinal Brace Spinal Brace: Thoracolumbosacral orthotic;Applied in sitting position Restrictions Weight Bearing Restrictions: Yes LLE Weight Bearing: Weight bearing as tolerated      Mobility  Bed Mobility Overal bed mobility: Needs Assistance Bed Mobility: Supine to Sit;Sit to Supine     Supine to sit: Min assist Sit to supine: Min guard    General bed mobility comments: minA to assist LLE off bed and trunk elevation, however min guard for return to supine as patient was able to bring LLE back onto bed with no physical assist    Transfers Overall transfer level: Needs assistance Equipment used: Rolling Xandra Laramee (2 wheeled) Transfers: Sit to/from Stand Sit to Stand: Mod assist         General transfer comment: modA for boost up from low surface, cues for hand placement  Ambulation/Gait Ambulation/Gait assistance: Min guard Gait Distance (Feet): 25 Feet Assistive device: Rolling Babyboy Loya (2 wheeled) Gait Pattern/deviations: Step-to pattern;Decreased stride length;Decreased stance time - left;Antalgic Gait velocity: decreased   General Gait Details: min guard for safety and management of lines, no overt LOB noted, however reliant on UE support  Stairs            Wheelchair Mobility    Modified Rankin (Stroke Patients Only)       Balance Overall balance assessment: Needs assistance Sitting-balance support: Bilateral upper extremity supported;Feet supported Sitting balance-Leahy Scale: Fair     Standing balance support: Bilateral upper extremity supported;During functional activity Standing balance-Leahy Scale: Poor Standing balance comment: reliant on UE support                             Pertinent Vitals/Pain Pain Assessment: Faces Faces Pain Scale: Hurts whole lot Pain Location: L hip Pain Descriptors / Indicators: Grimacing;Guarding;Sore Pain Intervention(s): Monitored during session    Home Living Family/patient expects to be discharged to:: Private residence Living Arrangements: Spouse/significant other Available Help at Discharge: Family;Available PRN/intermittently Type of Home: House Home Access: Stairs to enter Entrance Stairs-Rails: None Entrance Stairs-Number of Steps: 2 Home  Layout: One level Home Equipment: Traeton Bordas - 2 wheels;Cane - single point;Bedside commode       Prior Function Level of Independence: Independent               Hand Dominance        Extremity/Trunk Assessment   Upper Extremity Assessment Upper Extremity Assessment: Defer to OT evaluation    Lower Extremity Assessment Lower Extremity Assessment: Generalized weakness       Communication   Communication: No difficulties  Cognition Arousal/Alertness: Awake/alert Behavior During Therapy: WFL for tasks assessed/performed Overall Cognitive Status: Within Functional Limits for tasks assessed                                        General Comments      Exercises     Assessment/Plan    PT Assessment Patient needs continued PT services  PT Problem List Decreased strength;Decreased activity tolerance;Decreased range of motion;Decreased balance;Decreased mobility;Decreased knowledge of precautions       PT Treatment Interventions DME instruction;Gait training;Stair training;Functional mobility training;Therapeutic activities;Therapeutic exercise;Balance training;Patient/family education    PT Goals (Current goals can be found in the Care Plan section)  Acute Rehab PT Goals Patient Stated Goal: to get better and go home PT Goal Formulation: With patient Time For Goal Achievement: 11/14/20 Potential to Achieve Goals: Good    Frequency Min 5X/week   Barriers to discharge        Co-evaluation               AM-PAC PT "6 Clicks" Mobility  Outcome Measure Help needed turning from your back to your side while in a flat bed without using bedrails?: A Little Help needed moving from lying on your back to sitting on the side of a flat bed without using bedrails?: A Little Help needed moving to and from a bed to a chair (including a wheelchair)?: A Little Help needed standing up from a chair using your arms (e.g., wheelchair or bedside chair)?: A Lot Help needed to walk in hospital room?: A Little Help needed climbing 3-5 steps with a railing?  : A Lot 6 Click Score: 16    End of Session Equipment Utilized During Treatment: Gait belt;Back brace Activity Tolerance: Patient tolerated treatment well Patient left: in bed;with call bell/phone within reach;with nursing/sitter in room;with family/visitor present Nurse Communication: Mobility status PT Visit Diagnosis: Unsteadiness on feet (R26.81);Muscle weakness (generalized) (M62.81);History of falling (Z91.81);Other abnormalities of gait and mobility (R26.89)    Time: 8841-6606 PT Time Calculation (min) (ACUTE ONLY): 36 min   Charges:   PT Evaluation $PT Eval Moderate Complexity: 1 Mod PT Treatments $Therapeutic Activity: 8-22 mins        Abdulkadir Emmanuel A. Gilford Rile PT, DPT Acute Rehabilitation Services Pager [phone] Office [phone]   Alda Lea 10/31/2020, 4:59 PM

## 2020-10-31 NOTE — Op Note (Signed)
Orthopaedic Surgery Operative Note (CSN: [id number] ) Date of Surgery:  10/31/2020  Admit Date: 10/30/2020   Diagnoses: Pre-Op Diagnoses: Left valgus impacted femoral neck fracture  Post-Op Diagnosis: Same  Procedures: CPT 27235-Percutaneous fixation of left femoral neck fracture  Surgeons : Primary: Shona Needles, MD  Assistant: Patrecia Pace, PA-C  Location: OR 5   Anesthesia:General  Antibiotics: Ancef 2g preop   Tourniquet time:None  Estimated Blood KNLZ:76 mL  Complications:None   Specimens:None   Implants: Implant Name Type Inv. Item Serial No. Manufacturer Lot No. LRB No. Used Action  SCREW CANN 6.5X80 HEXAGONAL - [id number] Screw SCREW CANN 6.5X80 HEXAGONAL  [employer]  Left 1 Implanted  WASHER FOR 5.0 SCREWS - [id number] Washer WASHER FOR 5.0 SCREWS  [employer]  Left 1 Implanted  SCREW CANN FT 6.5X70 FT - [id number] Screw SCREW CANN FT 6.5X70 FT  [employer]  Left 1 Implanted  SCREW CANN F/THREAD 6.5X75 - [id number] Screw SCREW CANN F/THREAD 6.5X75  [employer]  Left 1 Implanted     Indications for Surgery: 65 year old female who fell off her bed sustaining a left valgus impacted femoral neck fracture.  Due to the location of the fracture I recommended proceeding with percutaneous fixation of her left femoral neck.  Risks and benefits were discussed with the patient.  Risks include but not limited to bleeding, infection, malunion, nonunion, hardware failure, hardware irritation, nerve and blood vessel injury, DVT, posttraumatic arthritis, need for hip arthroplasty, even possibility anesthetic complications.  She agreed to proceed with surgery and consent was obtained.  Operative Findings: Percutaneous fixation of left femoral neck fracture using Synthes 6.5 mm fully threaded cannulated screws.  Procedure: The patient was identified in the preoperative holding area. Consent was confirmed with the patient and their family and all questions  were answered. The operative extremity was marked after confirmation with the patient. she was then brought back to the operating room by our anesthesia colleagues.  She was placed under general anesthetic and carefully transferred over to a radiolucent flat top table.  A bump was placed under her operative hip.  Fluoroscopic imaging was obtained to make sure that the fracture had not displaced.  The left lower extremity was then prepped and draped in usual sterile fashion.  A timeout was performed to verify the patient, the procedure, and the extremity.  Preoperative antibiotics were dosed.  A small incision was made lateral to the femur carried down through skin subcutaneous tissue and through the IT band.  I then directed threaded guidewires for the cannulated screw set up into the head neck segment.  I used an inverted triangle pattern with 1 posterior and inferior and 2 proximal one anterior and one posterior.  I confirmed positioning with AP and lateral fluoroscopic imaging.  I then performed a approach withdrawal imaging with fluoroscopy to make sure none of them violated the femoral head.  I then measured drilled and placed a fully threaded 6.5 mm cannulated screws.  The most inferior one was placed with a washer.  Excellent purchase was obtained.  Final fluoroscopic imaging was obtained.  The incision was copiously irrigated.  The skin was closed with 2-0 Vicryl and 3-0 Monocryl.  It was sealed with Dermabond.  A sterile dressing was placed.  The patient was then awoken from anesthesia and taken to the PACU in stable condition.   Post Op Plan/Instructions: Patient will be weightbearing as tolerated to left lower extremity.  She will receive postoperative Ancef.  She will receive  Lovenox for DVT prophylaxis while inpatient and discharged home on aspirin 325 mg daily.  We will have her mobilize with physical and Occupational Therapy.  I was present and performed the entire surgery.  Katha Hamming,  MD [facility]

## 2020-10-31 NOTE — Progress Notes (Signed)
Initial Nutrition Assessment  DOCUMENTATION CODES:   Not applicable  INTERVENTION:  Ensure Max po daily, each supplement provides 150 kcal and 30 grams of protein  Juven BID, each packet provides 95 calories, 2.5 grams of protein (collagen), and 9.8 grams of carbohydrate (3 grams sugar); also contains 7 grams of L-arginine and L-glutamine, 300 mg vitamin C, 15 mg vitamin E, 1.2 mcg vitamin B-12, 9.5 mg zinc, 200 mg calcium, and 1.5 g  Calcium Beta-hydroxy-Beta-methylbutyrate to support wound healing  Continue MVI with minerals daily  NUTRITION DIAGNOSIS:   Increased nutrient needs related to post-op healing,hip fracture as evidenced by estimated needs.   GOAL:   Patient will meet greater than or equal to 90% of their needs   MONITOR:   Labs,I & O's,Supplement acceptance,PO intake,Weight trends,Skin  REASON FOR ASSESSMENT:   Consult Assessment of nutrition requirement/status (hip fx)  ASSESSMENT:  65 year old female with history significant of EtOH abuse, DM2, NSCLC in remission s/p lobectomy, and HTN. Pt presented with left hip pain after falling out of bed at home and found to have left hip fracture as well as incidental findings of mediastinal adenopathy, pulmonary nodule, and pancreatic calcifications on CT chest and abdomen.  Pt is s/p percutaneous fixation of left femoral neck fracture on 2/5  RD working remotely.  Pt currently off floor, unable to contact via phone to obtain nutrition history at this time. Diet has been advanced, will order Ensure Max to help her meet increased needs as well as Juven to support post-op wound healing.   Per chart, weight 65.3 kg (143.66 lbs) on 10/30/20 decreased ~22 lbs from 75.4 kg (165.88 lbs) on 01/21/20; significant (13.4%) Given history significant of NSCLC with incidental findings on CT as well as history of EtOH abuse, highly suspect malnutrition, however unable to identify at this time. Will plan to complete exam at obtain  nutrition at follow-up.   Medications reviewed and include: Folic acid, Dilaudid, MVI, Thiamine, IV Ancef, IV Myxredlin @ 1.7 units/hr  D5 in lactated ringers @ 125 ml/hr  Labs: CBGs 376,283,151, K 3.8 (WNL) 2/4 Mg/P (WNL) A1c 6.7 on 10/30/20 (well controlled)  NUTRITION - FOCUSED PHYSICAL EXAM: Unable to complete at this time  Diet Order:   Diet Order            Diet Carb Modified Fluid consistency: Thin; Room service appropriate? Yes  Diet effective now                 EDUCATION NEEDS:   No education needs have been identified at this time  Skin:  Skin Assessment: Skin Integrity Issues: Skin Integrity Issues:: Incisions Incisions: closed; L hip  Last BM:  pta  Height:   Ht Readings from Last 1 Encounters:  10/30/20 5\' 8"  (1.727 m)    Weight:   Wt Readings from Last 1 Encounters:  10/30/20 65.3 kg    BMI:  Body mass index is 21.89 kg/m.  Estimated Nutritional Needs:   Kcal:  2000-2200  Protein:  100-115  Fluid:  2L/day   Lajuan Lines, RD, LDN Clinical Nutrition After Hours/Weekend Pager # in [place]

## 2020-10-31 NOTE — Transfer of Care (Signed)
Immediate Anesthesia Transfer of Care Note  Patient: [name]  Procedure(s) Performed: CANNULATED HIP PINNING (Left Hip)  Patient Location: PACU  Anesthesia Type:General  Level of Consciousness: awake, alert  and patient cooperative  Airway & Oxygen Therapy: Patient Spontanous Breathing  Post-op Assessment: Report given to RN and Post -op Vital signs reviewed and stable  Post vital signs: Reviewed and stable  Last Vitals:  Vitals Value Taken Time  BP 165/84   Temp 36.3 C 10/31/20 0900  Pulse 25 10/31/20 0900  Resp 19 10/31/20 0900  SpO2 67 % 10/31/20 0900  Vitals shown include unvalidated device data.  Last Pain:  Vitals:   10/31/20 0605  TempSrc:   PainSc: 6       Patients Stated Pain Goal: 2 (48/54/62 7035)  Complications: No complications documented.

## 2020-11-01 ENCOUNTER — Inpatient Hospital Stay (HOSPITAL_COMMUNITY): Payer: Medicare HMO

## 2020-11-01 DIAGNOSIS — S72002A Fracture of unspecified part of neck of left femur, initial encounter for closed fracture: Secondary | ICD-10-CM | POA: Diagnosis not present

## 2020-11-01 LAB — CBC WITH DIFFERENTIAL/PLATELET
Abs Immature Granulocytes: 0.03 10*3/uL (ref 0.00–0.07)
Basophils Absolute: 0.1 10*3/uL (ref 0.0–0.1)
Basophils Relative: 1 %
Eosinophils Absolute: 0.2 10*3/uL (ref 0.0–0.5)
Eosinophils Relative: 2 %
HCT: 43.9 % (ref 36.0–46.0)
Hemoglobin: 15.5 g/dL — ABNORMAL HIGH (ref 12.0–15.0)
Immature Granulocytes: 0 %
Lymphocytes Relative: 18 %
Lymphs Abs: 1.6 10*3/uL (ref 0.7–4.0)
MCH: 32.1 pg (ref 26.0–34.0)
MCHC: 35.3 g/dL (ref 30.0–36.0)
MCV: 90.9 fL (ref 80.0–100.0)
Monocytes Absolute: 1.3 10*3/uL — ABNORMAL HIGH (ref 0.1–1.0)
Monocytes Relative: 14 %
Neutro Abs: 5.8 10*3/uL (ref 1.7–7.7)
Neutrophils Relative %: 65 %
Platelets: 176 10*3/uL (ref 150–400)
RBC: 4.83 MIL/uL (ref 3.87–5.11)
RDW: 16.5 % — ABNORMAL HIGH (ref 11.5–15.5)
WBC: 9 10*3/uL (ref 4.0–10.5)
nRBC: 0 % (ref 0.0–0.2)

## 2020-11-01 LAB — MAGNESIUM: Magnesium: 2 mg/dL (ref 1.7–2.4)

## 2020-11-01 LAB — BASIC METABOLIC PANEL
Anion gap: 13 (ref 5–15)
BUN: 6 mg/dL — ABNORMAL LOW (ref 8–23)
CO2: 24 mmol/L (ref 22–32)
Calcium: 8.9 mg/dL (ref 8.9–10.3)
Chloride: 104 mmol/L (ref 98–111)
Creatinine, Ser: 0.76 mg/dL (ref 0.44–1.00)
GFR, Estimated: >60 mL/min (ref >60)
Glucose, Bld: 125 mg/dL — ABNORMAL HIGH (ref 70–99)
Potassium: 3.7 mmol/L (ref 3.5–5.1)
Sodium: 141 mmol/L (ref 135–145)

## 2020-11-01 LAB — GLUCOSE, CAPILLARY
Glucose-Capillary: 128 mg/dL — ABNORMAL HIGH (ref 70–99)
Glucose-Capillary: 206 mg/dL — ABNORMAL HIGH (ref 70–99)
Glucose-Capillary: 110 mg/dL — ABNORMAL HIGH (ref 70–99)
Glucose-Capillary: 551 mg/dL (ref 70–99)
Glucose-Capillary: 167 mg/dL — ABNORMAL HIGH (ref 70–99)

## 2020-11-01 LAB — VITAMIN D 25 HYDROXY (VIT D DEFICIENCY, FRACTURES): Vit D, 25-Hydroxy: 21.39 ng/mL — ABNORMAL LOW (ref 30–100)

## 2020-11-01 LAB — PHOSPHORUS: Phosphorus: 1.5 mg/dL — ABNORMAL LOW (ref 2.5–4.6)

## 2020-11-01 MED ORDER — POTASSIUM PHOSPHATES 15 MMOLE/5ML IV SOLN
20.0000 mmol | Freq: Once | INTRAVENOUS | Status: AC
  Administered 2020-11-01: 20 mmol via INTRAVENOUS
  Filled 2020-11-01: qty 6.67

## 2020-11-01 MED ORDER — SUMATRIPTAN SUCCINATE 50 MG PO TABS
50.0000 mg | ORAL_TABLET | ORAL | Status: DC | PRN
  Administered 2020-11-01: 50 mg via ORAL
  Filled 2020-11-01 (×2): qty 1

## 2020-11-01 NOTE — Progress Notes (Signed)
Physical Therapy Treatment Patient Details Name: [name] MRN: [id number] DOB: 1956-02-17 Today's Date: 11/01/2020    History of Present Illness 65 y.o. 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 Patient found to have L femoral neck fx and T9-10 compression fxs. Patient s/p L hip pinning on 2/5.    PT Comments    Pt is making progress with mobility as her L hip and back pain decrease. However, she still heavily relies on her UEs on the RW to decrease her pain. Thus, educated pt and her husband on utilizing RW for safety at this time. Her pain in her L hip results in her having an antalgic step-to gait pattern, but noted improved R step length as distance progressed. Pt requires reminders and cues to maintain her spinal precautions with bed mobility and needs physical assistance of minA to roll, minA to come to stand, and modA to ascend her trunk to sit up due to her generalized weakness. Will continue to follow acutely. Current recommendations remain appropriate.  Follow Up Recommendations  Home health PT;Supervision for mobility/OOB     Equipment Recommendations  Rolling walker with 5" wheels;3in1 (PT) (narrow RW to fit through narrow doorways at home)    Recommendations for Other Services       Precautions / Restrictions Precautions Precautions: Fall;Back Precaution Booklet Issued: Yes (comment) Precaution Comments: educated in back precautions and provided another written handout Required Braces or Orthoses: Spinal Brace Spinal Brace: Thoracolumbosacral orthotic;Applied in sitting position Restrictions Weight Bearing Restrictions: Yes LLE Weight Bearing: Weight bearing as tolerated    Mobility  Bed Mobility Overal bed mobility: Needs Assistance Bed Mobility: Rolling;Sidelying to  Sit Rolling: Min assist Sidelying to sit: Mod assist       General bed mobility comments: Cued pt for log roll technique towards R with minA at hips. Cued pt to bring legs anteriorly towards EOB and bring R elbow under trunk to ascend trunk to sit up, modA to initiate at trunk.  Transfers Overall transfer level: Needs assistance Equipment used: Rolling walker (2 wheeled) Transfers: Sit to/from Stand Sit to Stand: Min assist         General transfer comment: extra time and minA to rise and steady from elevated bed. Cues for hand placement  Ambulation/Gait Ambulation/Gait assistance: Min guard Gait Distance (Feet): 50 Feet Assistive device: Rolling walker (2 wheeled) Gait Pattern/deviations: Step-to pattern;Decreased stride length;Decreased stance time - left;Antalgic;Step-through pattern;Decreased step length - right Gait velocity: decreased Gait velocity interpretation: <1.31 ft/sec, indicative of household ambulator General Gait Details: Min guard for safety, no overt LOB. Provided verbal and tactile cues to increase L stance time and foot clearance and R step length, with mod success and carryover. Pt reliant on UEs to push through RW to unweight leg and back from pain. Educated pt and pt's husband on RW being safer option at this time versus an "upright" walker or rollator.   Stairs             Wheelchair Mobility    Modified Rankin (Stroke Patients Only)       Balance Overall balance assessment: Needs assistance Sitting-balance support: Feet supported;No upper extremity supported Sitting balance-Leahy Scale: Fair Sitting balance - Comments: Static sitting EOB no LOB, min guard for safety.   Standing balance support: Bilateral upper extremity  supported;During functional activity Standing balance-Leahy Scale: Poor Standing balance comment: reliant on UE support and min guard assist                            Cognition Arousal/Alertness:  Awake/alert Behavior During Therapy: WFL for tasks assessed/performed Overall Cognitive Status: Within Functional Limits for tasks assessed                                        Exercises      General Comments        Pertinent Vitals/Pain Pain Assessment: Faces Pain Score: 9  Faces Pain Scale: Hurts whole lot Pain Location: L hip Pain Descriptors / Indicators: Grimacing;Guarding;Sore Pain Intervention(s): Limited activity within patient's tolerance;Monitored during session;Repositioned    Home Living Family/patient expects to be discharged to:: Private residence Living Arrangements: Spouse/significant other Available Help at Discharge: Family;Available PRN/intermittently Type of Home: House Home Access: Stairs to enter Entrance Stairs-Rails: None Home Layout: One level Home Equipment: Environmental consultant - 2 wheels;Cane - single point;Bedside commode;Adaptive equipment      Prior Function Level of Independence: Independent      Comments: reports falls   PT Goals (current goals can now be found in the care plan section) Acute Rehab PT Goals Patient Stated Goal: to get better and go home PT Goal Formulation: With patient/family Time For Goal Achievement: 11/14/20 Potential to Achieve Goals: Good Progress towards PT goals: Progressing toward goals    Frequency    Min 5X/week      PT Plan Current plan remains appropriate    Co-evaluation              AM-PAC PT "6 Clicks" Mobility   Outcome Measure  Help needed turning from your back to your side while in a flat bed without using bedrails?: A Little Help needed moving from lying on your back to sitting on the side of a flat bed without using bedrails?: A Lot Help needed moving to and from a bed to a chair (including a wheelchair)?: A Little Help needed standing up from a chair using your arms (e.g., wheelchair or bedside chair)?: A Little Help needed to walk in hospital room?: A Little Help needed  climbing 3-5 steps with a railing? : A Lot 6 Click Score: 16    End of Session Equipment Utilized During Treatment: Gait belt;Back brace Activity Tolerance: Patient tolerated treatment well Patient left: in chair;with call bell/phone within reach;with chair alarm set;with family/visitor present   PT Visit Diagnosis: Unsteadiness on feet (R26.81);Muscle weakness (generalized) (M62.81);History of falling (Z91.81);Other abnormalities of gait and mobility (R26.89);Difficulty in walking, not elsewhere classified (R26.2);Pain Pain - Right/Left: Left Pain - part of body: Hip (and back)     Time: 8527-7824 PT Time Calculation (min) (ACUTE ONLY): 26 min  Charges:  $Gait Training: 8-22 mins $Therapeutic Activity: 8-22 mins                     Moishe Spice, PT, DPT Acute Rehabilitation Services  Pager: [phone] Office: [place] 11/01/2020, 12:39 PM

## 2020-11-01 NOTE — Social Work (Signed)
CSW provided patient with SA resources and patient stated that she was unsure if she would utilize them because this was a small set back for her. Patient agreed to accept the resources.

## 2020-11-01 NOTE — Evaluation (Signed)
Occupational Therapy Evaluation Patient Details Name: [name] MRN: [id number] DOB: [birth year] Today's Date: 11/01/2020    History of Present Illness 65 y.o. 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 Patient found to have L femoral neck fx and T9-10 compression fxs. Patient s/p L hip pinning on 2/5.   Clinical Impression   Pt reports functioning independently prior to admission, but endorses many falls. Pt presents with generalized weakness, L hip pain and impaired standing balance. She requires min to mod assist for all mobility and set up to total assist for ADL. Pt has a reacher and long handled bath sponge at home. Will follow for ADL training, to reinforce back precautions, instruct in use of back brace and address ADL transfers. Recommending HHOT.     Follow Up Recommendations  Home health OT    Equipment Recommendations  Tub/shower bench    Recommendations for Other Services       Precautions / Restrictions Precautions Precautions: Fall;Back Precaution Booklet Issued: Yes (comment) Precaution Comments: educated in back precautions and provided written handout Required Braces or Orthoses: Spinal Brace Spinal Brace: Thoracolumbosacral orthotic;Applied in sitting position Restrictions Weight Bearing Restrictions: Yes LLE Weight Bearing: Weight bearing as tolerated      Mobility Bed Mobility Overal bed mobility: Needs Assistance Bed Mobility: Rolling;Sidelying to Sit Rolling: Mod assist         General bed mobility comments: cues for log roll technique assist for LEs over EOB and to raise trunk    Transfers Overall transfer level: Needs assistance Equipment used: Rolling walker (2 wheeled) Transfers: Sit to/from Stand Sit to Stand: Mod assist         General transfer  comment: assist to rise and steady from elevated bed    Balance Overall balance assessment: Needs assistance   Sitting balance-Leahy Scale: Fair     Standing balance support: Bilateral upper extremity supported;During functional activity Standing balance-Leahy Scale: Poor Standing balance comment: reliant on UE support and min to min guard assist                           ADL either performed or assessed with clinical judgement   ADL Overall ADL's : Needs assistance/impaired Eating/Feeding: Independent;Bed level   Grooming: Wash/dry hands;Wash/dry face;Sitting;Set up   Upper Body Bathing: Minimal assistance;Sitting   Lower Body Bathing: Total assistance;Sit to/from stand   Upper Body Dressing : Set up;Sitting Upper Body Dressing Details (indicate cue type and reason): max assist for TLSO Lower Body Dressing: Total assistance;Sit to/from stand   Toilet Transfer: Minimal assistance;Stand-pivot;RW   Toileting- Clothing Manipulation and Hygiene: Minimal assistance;Sitting/lateral lean         General ADL Comments: Began educating pt in back precautions and compensatory strategies for ADL. Pt has a reacher and long handled bath sponge, she is interested in sock aid.     Vision Baseline Vision/History: Wears glasses Patient Visual Report: No change from baseline       Perception     Praxis      Pertinent Vitals/Pain Pain Assessment: 0-10 Pain Score: 9  Pain Location: L hip Pain Descriptors / Indicators: Grimacing;Guarding;Sore Pain Intervention(s): Monitored during session;Premedicated before session;Ice applied     Hand Dominance Right   Extremity/Trunk Assessment Upper Extremity Assessment Upper Extremity  Assessment: Overall WFL for tasks assessed   Lower Extremity Assessment Lower Extremity Assessment: Defer to PT evaluation   Cervical / Trunk Assessment Cervical / Trunk Assessment: Other exceptions Cervical / Trunk Exceptions: fractures    Communication Communication Communication: No difficulties   Cognition Arousal/Alertness: Awake/alert Behavior During Therapy: WFL for tasks assessed/performed Overall Cognitive Status: Within Functional Limits for tasks assessed                                     General Comments       Exercises     Shoulder Instructions      Home Living Family/patient expects to be discharged to:: Private residence Living Arrangements: Spouse/significant other Available Help at Discharge: Family;Available PRN/intermittently Type of Home: House Home Access: Stairs to enter [employer] of Steps: 2 Entrance Stairs-Rails: None Home Layout: One level     Bathroom Shower/Tub: Teacher, early years/pre: Handicapped height     Home Equipment: Environmental consultant - 2 wheels;Cane - single point;Bedside commode;Adaptive equipment Adaptive Equipment: Reacher;Long-handled sponge        Prior Functioning/Environment Level of Independence: Independent        Comments: reports falls        OT Problem List: Decreased strength;Decreased activity tolerance;Impaired balance (sitting and/or standing);Decreased knowledge of use of DME or AE;Pain      OT Treatment/Interventions: Self-care/ADL training;DME and/or AE instruction;Patient/family education;Therapeutic activities;Balance training    OT Goals(Current goals can be found in the care plan section) Acute Rehab OT Goals Patient Stated Goal: to get better and go home OT Goal Formulation: With patient Time For Goal Achievement: 11/15/20 Potential to Achieve Goals: Good ADL Goals Pt Will Perform Grooming: with supervision;standing Pt Will Perform Lower Body Bathing: with supervision;with adaptive equipment;sit to/from stand Pt Will Perform Lower Body Dressing: with supervision;sit to/from stand;with adaptive equipment Pt Will Transfer to Toilet: with supervision;ambulating;bedside commode (over toilet) Pt Will  Perform Toileting - Clothing Manipulation and hygiene: with supervision;sit to/from stand Pt Will Perform Tub/Shower Transfer: with min assist;Tub transfer;tub bench;rolling walker;ambulating Additional ADL Goal #1: Pt and husband will be independent in donning and doffing back brace.  OT Frequency: Min 2X/week   Barriers to D/C:            Co-evaluation              AM-PAC OT "6 Clicks" Daily Activity     Outcome Measure Help from another person eating meals?: None Help from another person taking care of personal grooming?: A Little Help from another person toileting, which includes using toliet, bedpan, or urinal?: A Lot Help from another person bathing (including washing, rinsing, drying)?: A Lot Help from another person to put on and taking off regular upper body clothing?: A Little Help from another person to put on and taking off regular lower body clothing?: Total 6 Click Score: 15   End of Session Equipment Utilized During Treatment: Gait belt;Rolling walker;Back brace Nurse Communication: Mobility status  Activity Tolerance: Patient tolerated treatment well Patient left: in chair;with call bell/phone within reach;with chair alarm set;with family/visitor present  OT Visit Diagnosis: Unsteadiness on feet (R26.81);Other abnormalities of gait and mobility (R26.89);Pain;Muscle weakness (generalized) (M62.81)                Time: 9528-4132 OT Time Calculation (min): 40 min Charges:  OT General Charges $OT Visit: 1 Visit OT Evaluation $OT Eval Moderate Complexity: 1  Mod OT Treatments $Self Care/Home Management : 23-37 mins  Nestor Lewandowsky, OTR/L Acute Rehabilitation Services Pager: [phone] Office: [phone]  [name] 11/01/2020, 11:42 AM

## 2020-11-01 NOTE — Progress Notes (Signed)
SPORTS MEDICINE AND JOINT REPLACEMENT  Lara Mulch, MD    Carlyon Shadow, PA-C [address], [place], [place]  [address]                             [phone]   PROGRESS NOTE  Subjective:  negative for Chest Pain  negative for Shortness of Breath  negative for Nausea/Vomiting   negative for Calf Pain  negative for Bowel Movement   Tolerating Diet: yes         Patient reports pain as 4 on 0-10 scale.    Objective: Vital signs in last 24 hours:   Patient Vitals for the past 24 hrs:  BP Temp Temp src Pulse Resp SpO2  11/01/20 0523 - - Oral - 20 -  11/01/20 0100 (!) 157/78 98.8 F (37.1 C) Oral 85 19 98 %  10/31/20 2300 (!) 183/101 - - 87 - -  10/31/20 2000 - 98.7 F (37.1 C) Oral - - -  10/31/20 1900 (!) 169/86 98.8 F (37.1 C) Oral (!) 105 19 95 %  10/31/20 1600 (!) 155/82 99.1 F (37.3 C) Oral 100 18 95 %  10/31/20 1105 (!) 170/91 98.5 F (36.9 C) Oral 97 20 100 %  10/31/20 1000 (!) 160/77 - - - - -  10/31/20 0938 (!) 161/94 (!) 97.5 F (36.4 C) Oral 92 16 100 %  10/31/20 0930 (!) 151/79 - - 75 12 100 %  10/31/20 0915 (!) 146/101 - - 87 20 100 %  10/31/20 0900 (!) 165/84 (!) 97.3 F (36.3 C) - 87 (!) 21 98 %    @flow {1959:LAST@   Intake/Output from previous day:   02/05 0701 - 02/06 0700 In: 2083.4 [P.O.:240; I.V.:1843.4] Out: 9147 [Urine:5150]   Intake/Output this shift:   No intake/output data recorded.   Intake/Output      02/05 0701 02/06 0700 02/06 0701 02/07 0700   P.O. 240    I.V. (mL/kg) 1843.4 (28.2)    IV Piggyback     Total Intake(mL/kg) 2083.4 (31.9)    Urine (mL/kg/hr) 5150 (3.3)    Stool 0    Blood 25    Total Output 5175    Net -3091.6         Urine Occurrence 1 x    Stool Occurrence 1 x       LABORATORY DATA: Recent Labs    10/30/20 1300 10/30/20 1506 10/31/20 0529 11/01/20 0237  WBC 16.6*  --  9.2 9.0  HGB 15.2* 14.6 14.1 15.5*  HCT 45.0 43.0 43.0 43.9  PLT 229  --  161 176   Recent Labs    10/30/20 1300  10/30/20 1506 10/30/20 2025 10/30/20 2314 10/31/20 0529 11/01/20 0237  NA 137 138 139 140 139 141  K 4.1 4.0 4.1 4.8 3.8 3.7  CL 100  --  105 109 110 104  CO2 13*  --  14* 13* 15* 24  BUN 17  --  15 14 12  6*  CREATININE 1.29*  --  1.04* 1.14* 1.02* 0.76  GLUCOSE 140*  --  140* 132* 156* 125*  CALCIUM 9.5  --  8.9 8.8* 8.5* 8.9   Lab Results  Component Value Date   INR 1.7 (H) 01/17/2020    Examination:  General appearance: alert, cooperative and no distress Extremities: extremities normal, atraumatic, no cyanosis or edema  Wound Exam: clean, dry, intact   Drainage:  None: wound tissue dry  Motor Exam: Quadriceps and Hamstrings Intact  Sensory Exam: Superficial Peroneal, Deep Peroneal and Tibial normal   Assessment:    1 Day Post-Op  Procedure(s) (LRB): CANNULATED HIP PINNING (Left)  ADDITIONAL DIAGNOSIS:  Principal Problem:   Closed left hip fracture (HCC) Active Problems:   DM type 2 causing complication (HCC)   HTN (hypertension)   Alcohol abuse   Hx of cancer of lung   Ketoacidosis   Compression fracture of thoracic vertebra (HCC)     Plan: Physical Therapy as ordered Weight Bearing as Tolerated (WBAT)  DVT Prophylaxis:  Lovenox  DISCHARGE PLAN: Home  DISCHARGE NEEDS: HHPT    Patient alert and resting comfortably in bed. Mild amount of discomfort. Continue to progress with PT with anticipated D/C with [facility] 11/01/2020, 7:11 AM

## 2020-11-01 NOTE — Progress Notes (Signed)
PROGRESS NOTE    [name]  [id number] DOB: 1955-12-05 DOA: 10/30/2020 PCP: System, Provider Not In    Brief Narrative:  65 year old female with history of alcohol use, type 2 diabetes on insulin at home, non-small cell lung cancer in remission and status post lobectomy, hypertension presented to ER with left hip pain following a fall from bed at middle of the night.  She fell in the midnight, went to bed but continued to hurt and could not walk so came to ER.  In the emergency room, she was found to have impacted left femoral neck fracture. In the emergency room, she was also found to have anion gap metabolic acidosis and ketosis with hydroxybutyrate level of more than 8.  Blood glucose under 140.  Lactic normal.  pH 7.3.  Thoracic 9 and T10 compression fracture probably old. Patient was also noted to have mediastinal adenopathy, pulmonary nodule and pancreatic calcification with history of non-small cell lung cancer.  Assessment & Plan:   Principal Problem:   Closed left hip fracture (HCC) Active Problems:   DM type 2 causing complication (HCC)   HTN (hypertension)   Alcohol abuse   Hx of cancer of lung   Ketoacidosis   Compression fracture of thoracic vertebra (HCC)  Closed traumatic left hip fracture: Status post ORIF Dr. Doreatha Martin 2/5 Continue to work with PT OT today. Pain controlled with oral pain medications, IV morphine as needed and Robaxin. Lovenox for DVT prophylaxis.  Planning for aspirin 325 mg daily for DVT prophylaxis on discharge. Postop wound care and follow-up as per surgery. Therapy suggested home health PT OT.  Will be able to be discharged tomorrow.  Diabetes type 2 on insulin, ketoacidosis: Probably due to combination of dehydration, acute renal failure: On presentation she had anion gap.  Treated with aggressive IV fluids and IV insulin with clinical improvement.  Blood sugars normal. Transition to subcu insulin.  Blood sugar stable.  A1c 6.7.  T9 and  T10 compression fractures: Probably chronic.  Continue mobility.  Hypertension: Blood pressure is stable.  Will resume home medication including diltiazem.  Alcohol abuse: With evidence of chronic pancreatitis.  Patient denies regular alcohol use. No evidence of alcohol withdrawal.  On CIWA scale.  Continue low-dose benzodiazepine.  On multivitamins.  Currently no evidence of withdrawal.  Mediastinal lymphadenopathy with history of of non-small cell lung cancer status post resection 2004.   Probably chronic.   We will check noncontrast CT scan today.   If positive, will send her to pulmonary for follow-up.    Anxiety/depression: Resumed her home medications including SSRI. Stable.   Hypophosphatemia: Replaced today.  Acute urinary retention: Due to perioperative pelvic pain and immobility.  Significant retention.  Will give voiding trial tomorrow before discharge after improved mobility.   DVT prophylaxis: enoxaparin (LOVENOX) injection 40 mg Start: 11/01/20 0900 SCDs Start: 10/30/20 2303 SCDs Start: 10/30/20 2259   Code Status: Full code Family Communication: None Disposition Plan: Status is: Inpatient  Remains inpatient appropriate because:Inpatient level of care appropriate due to severity of illness   Dispo: The patient is from: Home              Anticipated d/c is to: Home with home health PT/OT              Anticipated d/c date is: tomorrow               Patient currently is not medically stable to d/c.   Difficult to place patient  No  Discontinue telemetry.  Transfer to MedSurg bed.  Continue mobility.  Anticipate discharge home tomorrow.   Consultants:   Orthopedics   Procedures:   ORIF left hip   Antimicrobials:   Cefazolin peri op.     Subjective: Patient seen and examined.  Overnight events noted.  After 1400 mL in a straight cath, she again had distended urinary bladder, Foley catheter was placed with more than a liter drained.  Feels relieved.  Does  not have a history of urinary retention. She thinks he will probably able to go home tomorrow.   Objective: Vitals:   10/31/20 2300 11/01/20 0100 11/01/20 0523 11/01/20 0800  BP: (!) 183/101 (!) 157/78  109/82  Pulse: 87 85  93  Resp:  19 20 20   Temp:  98.8 F (37.1 C)  98.5 F (36.9 C)  TempSrc:  Oral Oral Oral  SpO2:  98%  99%  Weight:      Height:        Intake/Output Summary (Last 24 hours) at 11/01/2020 1154 Last data filed at 11/01/2020 0000 Gross per 24 hour  Intake 1383.38 ml  Output 5000 ml  Net -3616.62 ml   Filed Weights   10/30/20 1142 10/30/20 2232  Weight: 68 kg 65.3 kg    Examination:  General exam: Appears comfortable. Respiratory system: Clear to auscultation. Respiratory effort normal.  No added sounds. Cardiovascular system: S1 & S2 heard, RRR. No JVD, murmurs, rubs, gallops or clicks. No pedal edema. Gastrointestinal system: soft.  Bowel sounds present.  Foley catheter with clear urine. Central nervous system: Alert and oriented. No focal neurological deficits. Extremities: Symmetric 5 x 5 power. Skin: No rashes, lesions or ulcers Left hip lateral incision clean and dry.  Distal neurovascular status intact.    Data Reviewed: I have personally reviewed following labs and imaging studies  CBC: Recent Labs  Lab 10/30/20 1300 10/30/20 1506 10/31/20 0529 11/01/20 0237  WBC 16.6*  --  9.2 9.0  NEUTROABS 14.3*  --   --  5.8  HGB 15.2* 14.6 14.1 15.5*  HCT 45.0 43.0 43.0 43.9  MCV 92.4  --  94.3 90.9  PLT 229  --  161 542   Basic Metabolic Panel: Recent Labs  Lab 10/30/20 1300 10/30/20 1506 10/30/20 2025 10/30/20 2314 10/31/20 0529 11/01/20 0237  NA 137 138 139 140 139 141  K 4.1 4.0 4.1 4.8 3.8 3.7  CL 100  --  105 109 110 104  CO2 13*  --  14* 13* 15* 24  GLUCOSE 140*  --  140* 132* 156* 125*  BUN 17  --  15 14 12  6*  CREATININE 1.29*  --  1.04* 1.14* 1.02* 0.76  CALCIUM 9.5  --  8.9 8.8* 8.5* 8.9  MG  --   --   --  1.7  --  2.0   PHOS  --   --   --  2.6  --  1.5*   GFR: Estimated Creatinine Clearance: 69.8 mL/min (by C-G formula based on SCr of 0.76 mg/dL). Liver Function Tests: Recent Labs  Lab 10/30/20 2314  AST 57*  ALT 44  ALKPHOS 110  BILITOT 2.1*  PROT 6.5  ALBUMIN 3.4*   No results for input(s): LIPASE, AMYLASE in the last 168 hours. No results for input(s): AMMONIA in the last 168 hours. Coagulation Profile: No results for input(s): INR, PROTIME in the last 168 hours. Cardiac Enzymes: No results for input(s): CKTOTAL, CKMB, CKMBINDEX, TROPONINI in the last  168 hours. BNP (last 3 results) No results for input(s): PROBNP in the last 8760 hours. HbA1C: Recent Labs    10/30/20 2314  HGBA1C 6.7*   CBG: Recent Labs  Lab 10/31/20 1238 10/31/20 1428 10/31/20 1619 10/31/20 2146 11/01/20 0611  GLUCAP 148* 181* 123* 165* 128*   Lipid Profile: No results for input(s): CHOL, HDL, LDLCALC, TRIG, CHOLHDL, LDLDIRECT in the last 72 hours. Thyroid Function Tests: No results for input(s): TSH, T4TOTAL, FREET4, T3FREE, THYROIDAB in the last 72 hours. Anemia Panel: No results for input(s): VITAMINB12, FOLATE, FERRITIN, TIBC, IRON, RETICCTPCT in the last 72 hours. Sepsis Labs: Recent Labs  Lab 10/30/20 1455 10/30/20 1700  LATICACIDVEN 1.0 0.8    Recent Results (from the past 240 hour(s))  SARS Coronavirus 2 by RT PCR (hospital order, performed in [facility] hospital lab) Nasopharyngeal Nasopharyngeal Swab     Status: None   Collection Time: 10/30/20  1:01 PM   Specimen: Nasopharyngeal Swab  Result Value Ref Range Status   SARS Coronavirus 2 NEGATIVE NEGATIVE Final    Comment: (NOTE) SARS-CoV-2 target nucleic acids are NOT DETECTED.  The SARS-CoV-2 RNA is generally detectable in upper and lower respiratory specimens during the acute phase of infection. The lowest concentration of SARS-CoV-2 viral copies this assay can detect is 250 copies / mL. A negative result does not preclude  SARS-CoV-2 infection and should not be used as the sole basis for treatment or other patient management decisions.  A negative result may occur with improper specimen collection / handling, submission of specimen other than nasopharyngeal swab, presence of viral mutation(s) within the areas targeted by this assay, and inadequate number of viral copies (<250 copies / mL). A negative result must be combined with clinical observations, patient history, and epidemiological information.  Fact Sheet for Patients:   StrictlyIdeas.no  Fact Sheet for Healthcare Providers: BankingDealers.co.za  This test is not yet approved or  cleared by the [place] FDA and has been authorized for detection and/or diagnosis of SARS-CoV-2 by FDA under an Emergency Use Authorization (EUA).  This EUA will remain in effect (meaning this test can be used) for the duration of the COVID-19 declaration under Section 564(b)(1) of the Act, 21 U.S.C. section 360bbb-3(b)(1), unless the authorization is terminated or revoked sooner.  Performed at [facility], [address], [place], [place] [address]   Surgical PCR screen     Status: Abnormal   Collection Time: 10/31/20  1:08 AM   Specimen: Nasal Mucosa; Nasal Swab  Result Value Ref Range Status   MRSA, PCR NEGATIVE NEGATIVE Final   Staphylococcus aureus POSITIVE (A) NEGATIVE Final    Comment: (NOTE) The Xpert SA Assay (FDA approved for NASAL specimens in patients 23 years of age and older), is one component of a comprehensive surveillance program. It is not intended to diagnose infection nor to guide or monitor treatment. Performed at [facility], [place] [address] [place], [place] [address]          Radiology Studies: CT Thoracic Spine Wo Contrast  Result Date: 10/30/2020 CLINICAL DATA:  Back trauma.  Fell a bed with pain. EXAM: CT THORACIC SPINE WITHOUT CONTRAST TECHNIQUE:  Multidetector CT images of the thoracic were obtained using the standard protocol without intravenous contrast. COMPARISON:  Chest radiograph 05/01/2013. FINDINGS: Alignment: Normal. Vertebrae: Compression deformities of the T9 and T10 vertebral bodies with approximately 40-50% height loss at T9 and 20% height loss at T10. There is no trabecular sclerosis, discrete  fracture lucency, or substantial paraspinal soft tissue stranding. Otherwise, vertebral body heights are maintained. Diffuse osteopenia. Paraspinal and other soft tissues: Small right pleural effusion with opacities in the medial aspect of the right lower lobe.There are two partially imaged 9 mm perifissural nodule in the the left upper lobe and lingula, which appear increased in size relative to prior from 2007. Mediastinal adenopathy. Partially imaged pancreatic calcifications, likely related to chronic pancreatitis. There is an adjacent peripherally calcified 3.4 cm lesion along the tail the pancreas. Disc levels: Mild multilevel degenerative change without evidence of significant bony canal stenosis. IMPRESSION: 1. T9 and T10 compression deformities with approximately 40-50% height loss of T9 and 20% height loss of T10. These are favored remote given similar appearance on prior chest radiograph from 05/01/2013 and given no trabecular sclerosis, discrete fracture lucency or paraspinal edema. If the patient is focally tender, an MRI could further evaluate for marrow edema. 2. Small right pleural effusion with overlying medial right lower lobe opacities, which could represent atelectasis, aspiration, or pneumonia. 3. Mediastinal adenopathy and two partially imaged 9 mm perifissural nodule in the the left upper lobe and lingula. The nodules appear increased in size relative to prior from 2007. Recommend non urgent chest CT with contrast for further evaluation. 4. Partially imaged pancreatic calcifications, likely related to chronic pancreatitis. There is  an adjacent peripherally calcified 3.4 cm lesion along the tail the pancreas. While this is favored to reflect a pseudocyst secondary to prior pancreatitis, it is incompletely evaluated. Recommend CT of the abdomen with contrast or MRI to further characterize and exclude solid component or aneurysm. Electronically Signed   By: Margaretha Sheffield MD   On: 10/30/2020 14:08   CT Hip Left Wo Contrast  Result Date: 10/30/2020 CLINICAL DATA:  Status post fall out of bed last night. EXAM: CT OF THE LEFT HIP WITHOUT CONTRAST TECHNIQUE: Multidetector CT imaging of the left hip was performed according to the standard protocol. Multiplanar CT image reconstructions were also generated. COMPARISON:  None. FINDINGS: Bones/Joint/Cartilage Nondisplaced impacted left subcapital femoral neck fracture. No other acute fracture or dislocation. Normal alignment. No joint effusion. No aggressive osseous lesion. Ligaments Ligaments are suboptimally evaluated by CT. Muscles and Tendons Muscles are normal.  No intramuscular fluid collection or hematoma. Soft tissue No fluid collection or hematoma. No soft tissue mass. Mild soft tissue edema in the subcutaneous fat overlying the left hip. IMPRESSION: Acute nondisplaced impacted left subcapital femoral neck fracture. Electronically Signed   By: Kathreen Devoid   On: 10/30/2020 13:34   Chest Portable 1 View  Result Date: 10/30/2020 CLINICAL DATA:  Preop EXAM: PORTABLE CHEST 1 VIEW COMPARISON:  01/17/2020 FINDINGS: Small right pleural effusion versus pleural thickening. No confluent opacities. Heart is normal size. No acute bony abnormality visualized. IMPRESSION: Small right pleural effusion versus pleural thickening. Electronically Signed   By: Rolm Baptise M.D.   On: 10/30/2020 23:33   DG C-Arm 1-60 Min  Result Date: 10/31/2020 CLINICAL DATA:  Hip pinning. EXAM: DG C-ARM 1-60 MIN; LEFT FEMUR 2 VIEWS FLUOROSCOPY TIME:  Fluoroscopy Time:  1 minutes and 9 seconds Port radiation dose: 11.11  mGy COMPARISON:  CT hip October 30, 2020. FINDINGS: Eight C-arm fluoroscopic images were obtained intraoperatively and submitted for post operative interpretation. These images demonstrate pinning of the left subcapital femoral neck fracture with 3 screws. No unexpected findings. Please see the performing provider's procedural report for further detail. IMPRESSION: Intraoperative fluoroscopic imaging, as detailed above. Electronically Signed   By: Albertina Parr  Adah Salvage MD   On: 10/31/2020 09:40   DG HIP PORT UNILAT W OR W/O PELVIS 1V LEFT  Result Date: 10/31/2020 CLINICAL DATA:  Left hip pinning. EXAM: DG HIP (WITH OR WITHOUT PELVIS) 1V PORT LEFT COMPARISON:  October 30, 2020. FINDINGS: Status post surgical internal fixation of proximal left femoral neck fracture. Good alignment of the fracture components is noted. IMPRESSION: Status post surgical internal fixation of proximal left femoral neck fracture. Electronically Signed   By: Marijo Conception M.D.   On: 10/31/2020 10:54   DG FEMUR MIN 2 VIEWS LEFT  Result Date: 10/31/2020 CLINICAL DATA:  Hip pinning. EXAM: DG C-ARM 1-60 MIN; LEFT FEMUR 2 VIEWS FLUOROSCOPY TIME:  Fluoroscopy Time:  1 minutes and 9 seconds Port radiation dose: 11.11 mGy COMPARISON:  CT hip October 30, 2020. FINDINGS: Eight C-arm fluoroscopic images were obtained intraoperatively and submitted for post operative interpretation. These images demonstrate pinning of the left subcapital femoral neck fracture with 3 screws. No unexpected findings. Please see the performing provider's procedural report for further detail. IMPRESSION: Intraoperative fluoroscopic imaging, as detailed above. Electronically Signed   By: Margaretha Sheffield MD   On: 10/31/2020 09:40   DG FEMUR MIN 2 VIEWS LEFT  Result Date: 10/30/2020 CLINICAL DATA:  Fall, left leg pain EXAM: LEFT FEMUR 2 VIEWS COMPARISON:  None. FINDINGS: Two view radiograph left femur demonstrates an impacted left subcapital femoral neck fracture,  likely acute. The femoral head is still seated within the left acetabulum. Mild left hip degenerative arthritis. The remainder of the femur appears intact. No lytic or blastic bone lesion. Soft tissues are unremarkable. IMPRESSION: Suspected acute left subcapital femoral neck fracture. Correlation for point tenderness is recommended. This could be confirmed with CT imaging if physical examination findings are equivocal. Electronically Signed   By: Fidela Salisbury MD   On: 10/30/2020 12:38        Scheduled Meds: . atorvastatin  10 mg Oral Daily  . Chlorhexidine Gluconate Cloth  6 each Topical Daily  . diltiazem  360 mg Oral Daily  . DULoxetine  20 mg Oral Daily  . enoxaparin (LOVENOX) injection  40 mg Subcutaneous Q24H  . folic acid  1 mg Oral Daily  . insulin aspart  0-15 Units Subcutaneous TID WC  . insulin aspart  0-5 Units Subcutaneous QHS  . insulin detemir  10 Units Subcutaneous Daily  . multivitamin with minerals  1 tablet Oral Daily  . mupirocin ointment  1 application Nasal BID  . nutrition supplement (JUVEN)  1 packet Oral BID BM  . Ensure Max Protein  11 oz Oral Daily  . thiamine  100 mg Oral Daily   Or  . thiamine  100 mg Intravenous Daily   Continuous Infusions: . potassium PHOSPHATE IVPB (in mmol) 20 mmol (11/01/20 0828)     LOS: 2 days    Time spent: 30 minutes    Barb Merino, MD [facility] Pager [phone]

## 2020-11-01 NOTE — Progress Notes (Signed)
Mobility Specialist - Progress Note   11/01/20 1654  Mobility  Activity Transferred:  Bed to chair  Level of Assistance Moderate assist, patient does 50-74%  Assistive Device Front wheel walker  Mobility Response Tolerated well  Mobility performed by Mobility specialist  $Mobility charge 1 Mobility   Pt assisted to chair at request of RN. Pt mod assist to log roll and sit up on edge of bed, no assist to stand w/ elevated bed. VSS. She c/o 9/10 L hip pain throughout.   Pricilla Handler Mobility Specialist Mobility Specialist Phone: [phone]

## 2020-11-01 NOTE — Progress Notes (Signed)
Notified MD of 928cc bladder scan. Orders given and carried out. Foley placed using sterile technique with securing device placed on left leg with clear/yellow urine noted in collection bag. See PCR for vitals/Output. Will continue to monitor.

## 2020-11-02 ENCOUNTER — Encounter (HOSPITAL_COMMUNITY): Payer: Self-pay | Admitting: Student

## 2020-11-02 DIAGNOSIS — E118 Type 2 diabetes mellitus with unspecified complications: Secondary | ICD-10-CM | POA: Diagnosis not present

## 2020-11-02 DIAGNOSIS — R590 Localized enlarged lymph nodes: Secondary | ICD-10-CM | POA: Diagnosis present

## 2020-11-02 DIAGNOSIS — S22070D Wedge compression fracture of T9-T10 vertebra, subsequent encounter for fracture with routine healing: Secondary | ICD-10-CM | POA: Diagnosis not present

## 2020-11-02 DIAGNOSIS — S72002A Fracture of unspecified part of neck of left femur, initial encounter for closed fracture: Secondary | ICD-10-CM | POA: Diagnosis not present

## 2020-11-02 LAB — GLUCOSE, CAPILLARY
Glucose-Capillary: 148 mg/dL — ABNORMAL HIGH (ref 70–99)
Glucose-Capillary: 289 mg/dL — ABNORMAL HIGH (ref 70–99)

## 2020-11-02 MED ORDER — VITAMIN D3 25 MCG PO TABS: 1000.0000 [IU] | ORAL_TABLET | Freq: Every day | ORAL | 1 refills | Status: AC

## 2020-11-02 MED ORDER — HYDROCODONE-ACETAMINOPHEN 7.5-325 MG PO TABS: 1.0000 | ORAL_TABLET | Freq: Four times a day (QID) | ORAL | 0 refills | Status: AC | PRN

## 2020-11-02 MED ORDER — VITAMIN D 25 MCG (1000 UNIT) PO TABS
1000.0000 [IU] | ORAL_TABLET | Freq: Every day | ORAL | Status: DC
  Administered 2020-11-02: 1000 [IU] via ORAL
  Filled 2020-11-02: qty 1

## 2020-11-02 MED ORDER — ASPIRIN EC 325 MG PO TBEC: 325.0000 mg | DELAYED_RELEASE_TABLET | Freq: Every day | ORAL | 0 refills | Status: AC

## 2020-11-02 NOTE — TOC Transition Note (Signed)
Transition of Care (TOC) - CM/SW Discharge Note Marvetta Gibbons RN, BSN Transitions of Care Unit 4E- RN Case Manager See Treatment Team for direct phone #    Patient Details  Name: NKECHI LINEHAN MRN: [id number] Date of Birth: 02-09-1956  [facility]) CM/SW Contact:  Dawayne Patricia, RN Phone Number: 11/02/2020, 11:50 AM   Clinical Narrative:    Pt stable for transition home today, orders for HHPT/OT and DME have been placed, Pt home with spouse who will transport home.  CM spoke with pt at bedside- list provided for [facility] choice- Per CMS guidelines from medicare.gov website with star ratings (copy placed in shadow chart)- per pt she has used [facility] in past and would prefer to use them again. Discussed with pt DME needs- per pt she has needed DME at home that includes RW, BSC and tub chair. No other DME need noted at this time.  Per pt she has a PCP with [facility].   Referral called to [facility] with [facility] for HHPT/OT needs- referral has been accepted.    Final next level of care: [place] Barriers to Discharge: No Barriers Identified   Patient Goals and CMS Choice Patient states their goals for this hospitalization and ongoing recovery are:: return home and get back to doing normal things/walking CMS Medicare.gov Compare Post Acute Care list provided to:: Patient Choice offered to / list presented to : Patient  Discharge Placement               Home with [facility]        Discharge Plan and Services   Discharge Planning Services: CM Consult Post Acute Care Choice: Home Health,Durable Medical Equipment          DME Arranged: N/A DME Agency: NA       HH Arranged: PT,OT [place] Agency: [place] Date [facility] Agency Contacted: 11/02/20 Time [place]: 1115 Representative spoke with at [place]: [place] ([place]) Interventions     Readmission Risk Interventions Readmission Risk  Prevention Plan 11/02/2020  Post Dischage Appt Complete  Medication Screening Complete  Transportation Screening Complete  Some recent data might be hidden

## 2020-11-02 NOTE — Progress Notes (Signed)
Physical Therapy Treatment Patient Details Name: [name] MRN: [id number] DOB: 21-Sep-1956 Today's Date: 11/02/2020    History of Present Illness 65 y.o. 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 Patient found to have L femoral neck fx and T9-10 compression fxs. Patient s/p L hip pinning on 2/5.    PT Comments    Pt received in supine, agreeable to therapy session and with good participation and fair tolerance for mobility. Pt remains limited primarily due to LLE pain (denies any back pain), and noted to be slow processing cues at times and with decreased recall of safety with mobility including log rolling technique. Pt given handouts due to decreased short term memory recall of precautions/techniques and reviewed seated/supine HEP for pt to perform at home/BID, pt receptive to all instruction. Pt continues to benefit from PT services to progress toward functional mobility goals. Continue to recommend HHPT, will plan to have pt perform stair trial next session if she remains hospitalized following day.  Follow Up Recommendations  Home health PT;Supervision for mobility/OOB     Equipment Recommendations  Rolling walker with 5" wheels;3in1 (PT) (narrow RW to fit through narrow doorways at home)    Recommendations for Other Services       Precautions / Restrictions Precautions Precautions: Fall;Back Precaution Booklet Issued: Yes (comment) Precaution Comments: educated in back precautions and provided another written handout Required Braces or Orthoses: Spinal Brace Spinal Brace: Thoracolumbosacral orthotic;Applied in sitting position Restrictions Weight Bearing Restrictions: Yes LLE Weight Bearing: Weight bearing as tolerated    Mobility  Bed Mobility Overal bed mobility: Needs  Assistance Bed Mobility: Rolling;Sidelying to Sit Rolling: Min assist Sidelying to sit: Mod assist       General bed mobility comments: Cued pt for log roll technique towards L with minA at hips. Cued pt to bring legs anteriorly towards EOB and bring R elbow under trunk to ascend trunk to sit up, modA to initiate at trunk. Very slow to perform and decreased carryover of cues noted, needs multimodal cues for proper technique.  Transfers Overall transfer level: Needs assistance Equipment used: Rolling walker (2 wheeled) Transfers: Sit to/from Stand Sit to Stand: Min assist         General transfer comment: extra time and minA to rise and steady from elevated bed. Cues for hand placement  Ambulation/Gait Ambulation/Gait assistance: Min guard Gait Distance (Feet): 25 Feet Assistive device: Rolling walker (2 wheeled) Gait Pattern/deviations: Step-to pattern;Decreased stride length;Decreased stance time - left;Antalgic;Step-through pattern;Decreased step length - right Gait velocity: decreased   General Gait Details: Min guard for safety, no overt LOB. Provided verbal and tactile cues to increase L stance time and foot clearance and R step length, with mod success and carryover. Pt reliant on UEs to push through RW to deweight leg and back from pain. Educated pt on safety with RW use and needs step by step cues for proper sequencing (RW, sore leg, good leg) to improve RW use for pain reduction   Stairs             Wheelchair Mobility    Modified Rankin (Stroke Patients Only)       Balance Overall balance assessment: Needs assistance Sitting-balance support: Feet supported;No upper extremity supported Sitting balance-Leahy Scale: Fair Sitting balance - Comments: Static sitting EOB no LOB,  min guard for safety.   Standing balance support: Bilateral upper extremity supported;During functional activity Standing balance-Leahy Scale: Poor Standing balance comment: reliant on  UE support and min guard assist                            Cognition Arousal/Alertness: Awake/alert Behavior During Therapy: WFL for tasks assessed/performed Overall Cognitive Status: Within Functional Limits for tasks assessed                                 General Comments: slow processing at times and decreased recall of precautions/safety and brace use      Exercises General Exercises - Lower Extremity Ankle Circles/Pumps: AROM;Both;10 reps;Supine Quad Sets: AROM;Both;5 reps;Supine    General Comments General comments (skin integrity, edema, etc.): reviewed back precautions, pt needs totalA to don brace but reviewed proper donning/doffing of brace and also technique for log rolling. Pt with limited carryover so given printed precaution instructions as well as handout for stair sequencing and HEP handout      Pertinent Vitals/Pain Pain Assessment: 0-10 Pain Score: 9  Faces Pain Scale: Hurts whole lot Pain Location: L hip Pain Descriptors / Indicators: Grimacing;Guarding;Sore Pain Intervention(s): Monitored during session;Premedicated before session;Repositioned (pt asking for ice pack, RN notified ~1 hr later (forgot initially))    Home Living                      Prior Function            PT Goals (current goals can now be found in the care plan section) Acute Rehab PT Goals Patient Stated Goal: to get better and go home PT Goal Formulation: With patient/family Time For Goal Achievement: 11/14/20 Potential to Achieve Goals: Good Progress towards PT goals: Progressing toward goals    Frequency    Min 5X/week      PT Plan Current plan remains appropriate    Co-evaluation              AM-PAC PT "6 Clicks" Mobility   Outcome Measure  Help needed turning from your back to your side while in a flat bed without using bedrails?: A Little Help needed moving from lying on your back to sitting on the side of a flat bed  without using bedrails?: A Lot Help needed moving to and from a bed to a chair (including a wheelchair)?: A Little Help needed standing up from a chair using your arms (e.g., wheelchair or bedside chair)?: A Little Help needed to walk in hospital room?: A Little Help needed climbing 3-5 steps with a railing? : A Lot 6 Click Score: 16    End of Session Equipment Utilized During Treatment: Gait belt;Back brace Activity Tolerance: Patient tolerated treatment well Patient left: with call bell/phone within reach;in chair (pt seated on toilet, RN/NT aware, pt knows how to use bathroom pull cord) Nurse Communication: Mobility status;Other (comment) (pt requesting ice pack) PT Visit Diagnosis: Unsteadiness on feet (R26.81);Muscle weakness (generalized) (M62.81);History of falling (Z91.81);Other abnormalities of gait and mobility (R26.89);Difficulty in walking, not elsewhere classified (R26.2);Pain Pain - Right/Left: Left Pain - part of body: Hip (and back)     Time: 3875-6433 PT Time Calculation (min) (ACUTE ONLY): 31 min  Charges:  $Gait Training: 8-22 mins $Therapeutic Activity: 8-22 mins  Houston Siren., PTA Acute Rehabilitation Services Pager: [phone] Office: [place] 11/02/2020, 1:43 PM

## 2020-11-02 NOTE — Progress Notes (Signed)
Occupational Therapy Treatment Patient Details Name: [name] MRN: [id number] DOB: 08-08-1956 Today's Date: 11/02/2020    History of present illness 65 y.o. 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 Patient found to have L femoral neck fx and T9-10 compression fxs. Patient s/p L hip pinning on 2/5.   OT comments  Pt progressing towards OT goals this session, Pt sitting EOB when OT arrived. Reviewed back handout with special focus on ADL (and LB ADL) including AE. Special attention to brace education for Pt AND husband, including sequencing, positioning, and importance of wearing when upright and especially when moving. Lt hip continues to be source of pain and no pain in back at this time. HHOT essential post-acute.    Follow Up Recommendations  Home health OT    Equipment Recommendations  Tub/shower bench    Recommendations for Other Services      Precautions / Restrictions Precautions Precautions: Fall;Back Precaution Booklet Issued: Yes (comment) Precaution Comments: educated in back precautions and provided another written handout Required Braces or Orthoses: Spinal Brace Spinal Brace: Thoracolumbosacral orthotic;Applied in sitting position Restrictions Weight Bearing Restrictions: Yes LLE Weight Bearing: Weight bearing as tolerated       Mobility Bed Mobility               General bed mobility comments: Pt sitting EOB without brace when OT walked in  Transfers Overall transfer level: Needs assistance Equipment used: Rolling walker (2 wheeled) Transfers: Sit to/from Stand Sit to Stand: Min assist         General transfer comment: extra time and minA to rise and steady from elevated bed. Cues for hand placement    Balance Overall balance assessment: Needs  assistance Sitting-balance support: Feet supported;No upper extremity supported Sitting balance-Leahy Scale: Fair Sitting balance - Comments: Static sitting EOB no LOB, min guard for safety.   Standing balance support: Bilateral upper extremity supported;During functional activity Standing balance-Leahy Scale: Poor Standing balance comment: reliant on UE support and min guard assist                           ADL either performed or assessed with clinical judgement   ADL Overall ADL's : Needs assistance/impaired                 Upper Body Dressing : Maximal assistance;Sitting Upper Body Dressing Details (indicate cue type and reason): max assist for TLSO; reducated Pt and husband Lower Body Dressing: Maximal assistance;Sit to/from stand   Toilet Transfer: Min guard;RW;Ambulation             General ADL Comments: continued education for AE with LB ADL and back precautions     Vision       Perception     Praxis      Cognition Arousal/Alertness: Awake/alert Behavior During Therapy: WFL for tasks assessed/performed Overall Cognitive Status: Within Functional Limits for tasks assessed                                 General Comments: slow processing at times and decreased recall of precautions/safety and brace use        Exercises     Shoulder Instructions  General Comments      Pertinent Vitals/ Pain       Pain Assessment: 0-10 Pain Score: 8  Pain Location: L hip Pain Descriptors / Indicators: Grimacing;Guarding;Sore Pain Intervention(s): Monitored during session;Repositioned;Premedicated before session  Home Living                                          Prior Functioning/Environment              Frequency  Min 2X/week        Progress Toward Goals  OT Goals(current goals can now be found in the care plan section)  Progress towards OT goals: Progressing toward goals  Acute Rehab OT  Goals Patient Stated Goal: to get better and go home OT Goal Formulation: With patient Time For Goal Achievement: 11/15/20 Potential to Achieve Goals: Good  Plan Discharge plan remains appropriate;Frequency remains appropriate    Co-evaluation                 AM-PAC OT "6 Clicks" Daily Activity     Outcome Measure   Help from another person eating meals?: None Help from another person taking care of personal grooming?: A Little Help from another person toileting, which includes using toliet, bedpan, or urinal?: A Lot Help from another person bathing (including washing, rinsing, drying)?: A Lot Help from another person to put on and taking off regular upper body clothing?: A Little Help from another person to put on and taking off regular lower body clothing?: A Lot 6 Click Score: 16    End of Session Equipment Utilized During Treatment: Back brace;Rolling walker  OT Visit Diagnosis: Unsteadiness on feet (R26.81);Other abnormalities of gait and mobility (R26.89);Pain;Muscle weakness (generalized) (M62.81)   Activity Tolerance Patient tolerated treatment well   Patient Left Other (comment) (in [place] wheeling out for DC)   Nurse Communication Mobility status        Time: 9562-1308 OT Time Calculation (min): 25 min  Charges: OT General Charges $OT Visit: 1 Visit OT Treatments $Self Care/Home Management : 23-37 mins  Jesse Sans OTR/L Acute Rehabilitation Services Pager: [phone] Office: [place] 11/02/2020, 3:06 PM

## 2020-11-02 NOTE — Discharge Summary (Signed)
Physician Discharge Summary  [name] [id number] DOB: 09/02/1956 DOA: 10/30/2020  PCP: System, Provider Not In  Admit date: 10/30/2020 Discharge date: 11/02/2020  Admitted From: Home Disposition: Home with home health  Recommendations for Outpatient Follow-up:  1. Follow up with PCP in 1-2 weeks 2. Follow-up with orthopedic surgery as a scheduled in 2 weeks.  Home Health: PT/OT Equipment/Devices: As recommended by PT  Discharge Condition: Stable CODE STATUS: Full code Diet recommendation: Low-salt, low-carb diet  Discharge summary:  65 year old female with history of type 2 diabetes on insulin, non-small cell lung cancer status post resection in 2004, sarcoidosis, hypertension presented to the ER with left hip pain following fall from bed at middle of the night.  In the emergency room she was found to have anion gap metabolic acidosis and ketosis with hydroxybutyrate level more than 8, blood glucose less than 140.  Lactic acid was normal.  Hip x-ray with left femoral neck fracture.  T9 and T10 compression fracture which is probably old.  She was also found to have midsternal adenopathy, pulmonary nodule and pancreatic calcification with history of chronic pancreatitis.   Close traumatic left hip fracture: Status post ORIF Dr. Doreatha Martin 2/5 PT OT recommended home health PT OT Weightbearing as tolerated DVT prophylaxis with aspirin 325 mg once daily on discharge.  Surgically stable as per surgery.  Type 2 diabetes on insulin, combined ketoacidosis.  Dehydration, acute renal failure.  Presented with a high anion gap.  Blood sugar was 140.  Treated aggressively with IV fluids and initially with IV insulin.  Hemoglobin A1c 6.7.  Renal function normalized.  Bicarbonate normalized. Fairly controlled diabetes.  She will go back on sliding scale insulin with meal as well as Toujeo that is she is taking.  Mediastinal lymphadenopathy with history of non-small cell lung cancer status post  resection in 2004: Patient has history of non-small cell lung cancer resected.  She had surveillance CT scan until 2010.  Currently no pulmonary symptoms. CT scan showed multiple lymphadenopathy, some of them with central calcification consistent with granulomatous disease.  Does have history of sarcoidosis. We will send patient to pulmonary clinic for follow-up, may need surveillance CT scans. ACE level was sent, results pending.  However, patient is stated that she does have history of sarcoid.   Multiple other medical issues including hypertension, anxiety and depression that are stable and she will just resume home medications.  Patient had acute urinary retention perioperative, needing Foley catheter placement that has been removed and patient has successfully voided.    Discharge Diagnoses:  Principal Problem:   Closed left hip fracture ([place]) Active Problems:   DM type 2 causing complication (HCC)   HTN (hypertension)   Alcohol abuse   Hx of cancer of lung   Ketoacidosis   Compression fracture of thoracic vertebra (HCC)   Lymphadenopathy, mediastinal    Discharge Instructions  Discharge Instructions    Ambulatory referral to Pulmonology   Complete by: As directed    Reason for referral: Lung Mass/Lung Nodule   Call MD for:  redness, tenderness, or signs of infection (pain, swelling, redness, odor or green/yellow discharge around incision site)   Complete by: As directed    Call MD for:  severe uncontrolled pain   Complete by: As directed    Diet - low sodium heart healthy   Complete by: As directed    Diet Carb Modified   Complete by: As directed    Increase activity slowly   Complete by: As  directed    No dressing needed   Complete by: As directed      Allergies as of 11/02/2020      Reactions   Ciprofloxacin Diarrhea      Medication List    TAKE these medications   aspirin EC 325 MG tablet Take 1 tablet (325 mg total) by mouth daily.   atorvastatin 10  MG tablet Commonly known as: LIPITOR Take 10 mg by mouth daily.   ciclopirox 8 % solution Commonly known as: PENLAC Apply 1 application topically at bedtime.   diltiazem 180 MG 24 hr capsule Commonly known as: CARDIZEM CD Take 360 mg by mouth daily.   DULoxetine 20 MG capsule Commonly known as: CYMBALTA Take 20 mg by mouth daily.   HYDROcodone-acetaminophen 7.5-325 MG tablet Commonly known as: NORCO Take 1-2 tablets by mouth every 6 (six) hours as needed for severe pain.   Insulin Pen Needle 29G X 12MM Misc Dispense insulin pen needles   ketoconazole 2 % cream Commonly known as: NIZORAL Apply 1 application topically daily.   naltrexone 50 MG tablet Commonly known as: DEPADE Take 1 tablet by mouth daily.   NovoLOG FlexPen 100 UNIT/ML FlexPen Generic drug: insulin aspart Inject 0-10 Units into the skin daily as needed for high blood sugar.   rizatriptan 10 MG tablet Commonly known as: MAXALT Take 10 mg by mouth as needed for migraine.   Toujeo SoloStar 300 UNIT/ML Solostar Pen Generic drug: insulin glargine (1 Unit Dial) Inject 24 Units into the skin daily.   vitamin B-12 1000 MCG tablet Commonly known as: CYANOCOBALAMIN Take 1,000 mcg by mouth daily.   vitamin C 500 MG tablet Commonly known as: ASCORBIC ACID Take 500 mg by mouth daily.   Vitamin D3 25 MCG tablet Commonly known as: Vitamin D Take 1 tablet (1,000 Units total) by mouth daily.            Durable Medical Equipment  (From admission, onward)         Start     Ordered   11/01/20 1315  For home use only DME 3 n 1  Once        11/01/20 1315   11/01/20 1313  For home use only DME Tub bench  Once        11/01/20 1315   11/01/20 1313  For home use only DME Walker rolling  Once       Question Answer Comment  Walker: With 5 Inch Wheels   Patient needs a walker to treat with the following condition Fracture of femoral neck, left, closed ([place])      11/01/20 1315           Discharge Care  Instructions  (From admission, onward)         Start     Ordered   11/02/20 0000  No dressing needed        11/02/20 1049          Follow-up Information    Haddix, Thomasene Lot, MD. Schedule an appointment as soon as possible for a visit in 2 week(s).   Specialty: Orthopedic Surgery Why: for repeat x-rays and wound check  Contact information: [place] [address] [phone]              Allergies  Allergen Reactions  . Ciprofloxacin Diarrhea    Consultations:  Orthopedics   Procedures/Studies: CT CHEST WO CONTRAST  Result Date: 11/01/2020 CLINICAL DATA:  Non-small cell lung cancer,  history of lung cancer resection in 2004 and recent hip fracture by report. EXAM: CT CHEST WITHOUT CONTRAST TECHNIQUE: Multidetector CT imaging of the chest was performed following the standard protocol without IV contrast. 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 Mild calcific atheromatous plaque in the thoracic aorta. Ascending thoracic aorta 3.3 cm. Central pulmonary vasculature dilated to 3.0 cm. Heart size normal without pericardial effusion. Mediastinum/Nodes: Bulky mediastinal lymph nodes with internal calcification bilaterally, RIGHT paratracheal, prevascular, LEFT hilar and RIGHT perihilar as well as subcarinal nodal enlargement. Some areas without calcification, for instance subcarinal nodal tissue on image 66 of series 3 measuring 11 mm. LEFT hilar nodal enlargement as large as 14 mm displaying calcification (image 68, series 3) RIGHT paratracheal lymph node on image 50 of series 3 measuring 13 mm. LEFT paratracheal and prevascular lymph nodes slightly smaller but numerous. Esophagus grossly normal. Lungs/Pleura: Irregular nodule on image 50 of series 5 question of tiny central calcification measuring 9 x 9 mm. Adjacent 3 mm nodule in the RIGHT upper lobe on image 51 of series 5 with central calcification.  Paramediastinal 6 mm nodule on image 43 also with question of central calcification. Postoperative changes of RIGHT middle and RIGHT lower lobectomy. 9 x 6 mm pulmonary nodule along the fissure in the LEFT chest, at this site on the study from 2007 there is a 5 mm nodule. Nodule in the LEFT upper lobe without calcification 7 mm. Not present on the previous study (image 38, series 5) Nodule along the LEFT paramediastinal border 10 x 5 mm. (Image 69, series 5) Another small nodule along the inferior pleural surface on image 110 of series 5 measuring 10 mm. Postoperative changes in the RIGHT lower lobe with septal thickening and pleural thickening present to some degree on previous imaging as far back as 2000 and 7 Upper Abdomen: Incidental imaging of the upper abdomen without acute process. Cystic lesions about the pancreas were seen on previous imaging with peripherally calcified cystic area seen about the tail of the pancreas and coarse calcifications in the pancreatic parenchyma on the current study. Mildly nodular hepatic contour. Suggestion of background steatosis. No pericholecystic stranding. Imaged portions of spleen and adrenal glands are normal. No acute upper abdominal process. Musculoskeletal: Signs of prior RIGHT thoracotomy. Osteopenia. No acute or destructive bone process. Compression fractures in the thoracic spine again favored to be chronic based on surrounding degenerative change. IMPRESSION: 1. Bulky mediastinal and bilateral hilar nodal enlargement with internal calcification. Findings could be seen in sarcoid but given the history of carcinoid tumor of the lung biochemical correlation, pulmonary consultation and PET imaging may be helpful. Review of prior pathology may be helpful to determine type of PET imaging obtained. 2. Small pulmonary nodules some with and some without calcification as above. Differential similar to above provided differential. 3. It should also be noted that there was  reportedly granulomatous change about lymph nodes in the chest which may further support the possibility of sarcoid. Further evaluation is however suggested. 4. Postoperative changes of RIGHT middle and RIGHT lower lobectomy pleural scarring and small pleural effusion increased since previous imaging. 5. Cystic lesions about the pancreas were seen on previous imaging with peripherally calcified cystic area about the tail of the pancreas and coarse calcifications in the pancreatic parenchyma on the current study. Finding strongly favor sequela of chronic pancreatitis appearing improved when compared to imaging from 2007. Consider three-month follow-up with dedicated abdominal imaging or attention on PET scan  if performed. 6. Mildly nodular hepatic contour. Correlate with any clinical or laboratory evidence of liver disease. 7. Aortic atherosclerosis. Hepatic steatosis and nodular contours, correlate with any clinical or laboratory evidence of liver disease. 8. Aortic Atherosclerosis (ICD10-I70.0). Electronically Signed   By: Zetta Bills M.D.   On: 11/01/2020 12:44   CT Thoracic Spine Wo Contrast  Result Date: 10/30/2020 CLINICAL DATA:  Back trauma.  Fell a bed with pain. EXAM: CT THORACIC SPINE WITHOUT CONTRAST TECHNIQUE: Multidetector CT images of the thoracic were obtained using the standard protocol without intravenous contrast. COMPARISON:  Chest radiograph 05/01/2013. FINDINGS: Alignment: Normal. Vertebrae: Compression deformities of the T9 and T10 vertebral bodies with approximately 40-50% height loss at T9 and 20% height loss at T10. There is no trabecular sclerosis, discrete fracture lucency, or substantial paraspinal soft tissue stranding. Otherwise, vertebral body heights are maintained. Diffuse osteopenia. Paraspinal and other soft tissues: Small right pleural effusion with opacities in the medial aspect of the right lower lobe.There are two partially imaged 9 mm perifissural nodule in the the left  upper lobe and lingula, which appear increased in size relative to prior from 2007. Mediastinal adenopathy. Partially imaged pancreatic calcifications, likely related to chronic pancreatitis. There is an adjacent peripherally calcified 3.4 cm lesion along the tail the pancreas. Disc levels: Mild multilevel degenerative change without evidence of significant bony canal stenosis. IMPRESSION: 1. T9 and T10 compression deformities with approximately 40-50% height loss of T9 and 20% height loss of T10. These are favored remote given similar appearance on prior chest radiograph from 05/01/2013 and given no trabecular sclerosis, discrete fracture lucency or paraspinal edema. If the patient is focally tender, an MRI could further evaluate for marrow edema. 2. Small right pleural effusion with overlying medial right lower lobe opacities, which could represent atelectasis, aspiration, or pneumonia. 3. Mediastinal adenopathy and two partially imaged 9 mm perifissural nodule in the the left upper lobe and lingula. The nodules appear increased in size relative to prior from 2007. Recommend non urgent chest CT with contrast for further evaluation. 4. Partially imaged pancreatic calcifications, likely related to chronic pancreatitis. There is an adjacent peripherally calcified 3.4 cm lesion along the tail the pancreas. While this is favored to reflect a pseudocyst secondary to prior pancreatitis, it is incompletely evaluated. Recommend CT of the abdomen with contrast or MRI to further characterize and exclude solid component or aneurysm. Electronically Signed   By: Margaretha Sheffield MD   On: 10/30/2020 14:08   CT Hip Left Wo Contrast  Result Date: 10/30/2020 CLINICAL DATA:  Status post fall out of bed last night. EXAM: CT OF THE LEFT HIP WITHOUT CONTRAST TECHNIQUE: Multidetector CT imaging of the left hip was performed according to the standard protocol. Multiplanar CT image reconstructions were also generated. COMPARISON:   None. FINDINGS: Bones/Joint/Cartilage Nondisplaced impacted left subcapital femoral neck fracture. No other acute fracture or dislocation. Normal alignment. No joint effusion. No aggressive osseous lesion. Ligaments Ligaments are suboptimally evaluated by CT. Muscles and Tendons Muscles are normal.  No intramuscular fluid collection or hematoma. Soft tissue No fluid collection or hematoma. No soft tissue mass. Mild soft tissue edema in the subcutaneous fat overlying the left hip. IMPRESSION: Acute nondisplaced impacted left subcapital femoral neck fracture. Electronically Signed   By: Kathreen Devoid   On: 10/30/2020 13:34   Chest Portable 1 View  Result Date: 10/30/2020 CLINICAL DATA:  Preop EXAM: PORTABLE CHEST 1 VIEW COMPARISON:  01/17/2020 FINDINGS: Small right pleural effusion versus pleural thickening. No  confluent opacities. Heart is normal size. No acute bony abnormality visualized. IMPRESSION: Small right pleural effusion versus pleural thickening. Electronically Signed   By: Rolm Baptise M.D.   On: 10/30/2020 23:33   DG C-Arm 1-60 Min  Result Date: 10/31/2020 CLINICAL DATA:  Hip pinning. EXAM: DG C-ARM 1-60 MIN; LEFT FEMUR 2 VIEWS FLUOROSCOPY TIME:  Fluoroscopy Time:  1 minutes and 9 seconds Port radiation dose: 11.11 mGy COMPARISON:  CT hip October 30, 2020. FINDINGS: Eight C-arm fluoroscopic images were obtained intraoperatively and submitted for post operative interpretation. These images demonstrate pinning of the left subcapital femoral neck fracture with 3 screws. No unexpected findings. Please see the performing provider's procedural report for further detail. IMPRESSION: Intraoperative fluoroscopic imaging, as detailed above. Electronically Signed   By: Margaretha Sheffield MD   On: 10/31/2020 09:40   DG HIP PORT UNILAT W OR W/O PELVIS 1V LEFT  Result Date: 10/31/2020 CLINICAL DATA:  Left hip pinning. EXAM: DG HIP (WITH OR WITHOUT PELVIS) 1V PORT LEFT COMPARISON:  October 30, 2020. FINDINGS:  Status post surgical internal fixation of proximal left femoral neck fracture. Good alignment of the fracture components is noted. IMPRESSION: Status post surgical internal fixation of proximal left femoral neck fracture. Electronically Signed   By: Marijo Conception M.D.   On: 10/31/2020 10:54   DG FEMUR MIN 2 VIEWS LEFT  Result Date: 10/31/2020 CLINICAL DATA:  Hip pinning. EXAM: DG C-ARM 1-60 MIN; LEFT FEMUR 2 VIEWS FLUOROSCOPY TIME:  Fluoroscopy Time:  1 minutes and 9 seconds Port radiation dose: 11.11 mGy COMPARISON:  CT hip October 30, 2020. FINDINGS: Eight C-arm fluoroscopic images were obtained intraoperatively and submitted for post operative interpretation. These images demonstrate pinning of the left subcapital femoral neck fracture with 3 screws. No unexpected findings. Please see the performing provider's procedural report for further detail. IMPRESSION: Intraoperative fluoroscopic imaging, as detailed above. Electronically Signed   By: Margaretha Sheffield MD   On: 10/31/2020 09:40   DG FEMUR MIN 2 VIEWS LEFT  Result Date: 10/30/2020 CLINICAL DATA:  Fall, left leg pain EXAM: LEFT FEMUR 2 VIEWS COMPARISON:  None. FINDINGS: Two view radiograph left femur demonstrates an impacted left subcapital femoral neck fracture, likely acute. The femoral head is still seated within the left acetabulum. Mild left hip degenerative arthritis. The remainder of the femur appears intact. No lytic or blastic bone lesion. Soft tissues are unremarkable. IMPRESSION: Suspected acute left subcapital femoral neck fracture. Correlation for point tenderness is recommended. This could be confirmed with CT imaging if physical examination findings are equivocal. Electronically Signed   By: Fidela Salisbury MD   On: 10/30/2020 12:38    (Echo, Carotid, EGD, Colonoscopy, ERCP)    Subjective: Patient seen and examined.  Minimal pain on mobility.  She was able to go to bathroom with help.  Urinated after Foley catheter removal.  Eager  to go home.   Discharge Exam: Vitals:   11/02/20 0413 11/02/20 0838  BP: (!) 144/75 (!) 145/74  Pulse: 84 88  Resp: 20 18  Temp: 98.5 F (36.9 C) 98.9 F (37.2 C)  SpO2: 95% 96%   Vitals:   11/01/20 1934 11/01/20 2300 11/02/20 0413 11/02/20 0838  BP: 134/76 133/74 (!) 144/75 (!) 145/74  Pulse: 98 81 84 88  Resp: 15 11 20 18   Temp: 99.3 F (37.4 C) 98.8 F (37.1 C) 98.5 F (36.9 C) 98.9 F (37.2 C)  TempSrc: Oral Oral Oral Oral  SpO2: 95% 98% 95% 96%  Weight:      Height:        General: Pt is alert, awake, not in acute distress Cardiovascular: RRR, S1/S2 +, no rubs, no gallops Respiratory: CTA bilaterally, no wheezing, no rhonchi Abdominal: Soft, NT, ND, bowel sounds + Extremities: no edema, no cyanosis Left hip incision clean and dry.  Minimal tenderness as expected.  Distal neurovascular status intact.    The results of significant diagnostics from this hospitalization (including imaging, microbiology, ancillary and laboratory) are listed below for reference.     Microbiology: Recent Results (from the past 240 hour(s))  SARS Coronavirus 2 by RT PCR (hospital order, performed in [facility] hospital lab) Nasopharyngeal Nasopharyngeal Swab     Status: None   Collection Time: 10/30/20  1:01 PM   Specimen: Nasopharyngeal Swab  Result Value Ref Range Status   SARS Coronavirus 2 NEGATIVE NEGATIVE Final    Comment: (NOTE) SARS-CoV-2 target nucleic acids are NOT DETECTED.  The SARS-CoV-2 RNA is generally detectable in upper and lower respiratory specimens during the acute phase of infection. The lowest concentration of SARS-CoV-2 viral copies this assay can detect is 250 copies / mL. A negative result does not preclude SARS-CoV-2 infection and should not be used as the sole basis for treatment or other patient management decisions.  A negative result may occur with improper specimen collection / handling, submission of specimen other than nasopharyngeal swab,  presence of viral mutation(s) within the areas targeted by this assay, and inadequate number of viral copies (<250 copies / mL). A negative result must be combined with clinical observations, patient history, and epidemiological information.  Fact Sheet for Patients:   StrictlyIdeas.no  Fact Sheet for Healthcare Providers: BankingDealers.co.za  This test is not yet approved or  cleared by the [place] FDA and has been authorized for detection and/or diagnosis of SARS-CoV-2 by FDA under an Emergency Use Authorization (EUA).  This EUA will remain in effect (meaning this test can be used) for the duration of the COVID-19 declaration under Section 564(b)(1) of the Act, 21 U.S.C. section 360bbb-3(b)(1), unless the authorization is terminated or revoked sooner.  Performed at [facility], [address], [place], [place] [address]   Surgical PCR screen     Status: Abnormal   Collection Time: 10/31/20  1:08 AM   Specimen: Nasal Mucosa; Nasal Swab  Result Value Ref Range Status   MRSA, PCR NEGATIVE NEGATIVE Final   Staphylococcus aureus POSITIVE (A) NEGATIVE Final    Comment: (NOTE) The Xpert SA Assay (FDA approved for NASAL specimens in patients 32 years of age and older), is one component of a comprehensive surveillance program. It is not intended to diagnose infection nor to guide or monitor treatment. Performed at [facility], [place] [address], [place], [place] [address]      Labs: BNP (last 3 results) Recent Labs    01/17/20 1600  BNP [id number]*   Basic Metabolic Panel: Recent Labs  Lab 10/30/20 1300 10/30/20 1506 10/30/20 2025 10/30/20 2314 10/31/20 0529 11/01/20 0237  NA 137 138 139 140 139 141  K 4.1 4.0 4.1 4.8 3.8 3.7  CL 100  --  105 109 110 104  CO2 13*  --  14* 13* 15* 24  GLUCOSE 140*  --  140* 132* 156* 125*  BUN 17  --  15 14 12  6*  CREATININE 1.29*  --  1.04* 1.14* 1.02* 0.76  CALCIUM  9.5  --  8.9 8.8* 8.5* 8.9  MG  --   --   --  1.7  --  2.0  PHOS  --   --   --  2.6  --  1.5*   Liver Function Tests: Recent Labs  Lab 10/30/20 2314  AST 57*  ALT 44  ALKPHOS 110  BILITOT 2.1*  PROT 6.5  ALBUMIN 3.4*   No results for input(s): LIPASE, AMYLASE in the last 168 hours. No results for input(s): AMMONIA in the last 168 hours. CBC: Recent Labs  Lab 10/30/20 1300 10/30/20 1506 10/31/20 0529 11/01/20 0237  WBC 16.6*  --  9.2 9.0  NEUTROABS 14.3*  --   --  5.8  HGB 15.2* 14.6 14.1 15.5*  HCT 45.0 43.0 43.0 43.9  MCV 92.4  --  94.3 90.9  PLT 229  --  161 176   Cardiac Enzymes: No results for input(s): CKTOTAL, CKMB, CKMBINDEX, TROPONINI in the last 168 hours. BNP: Invalid input(s): POCBNP CBG: Recent Labs  Lab 11/01/20 1202 11/01/20 1802 11/01/20 2121 11/01/20 2300 11/02/20 0604  GLUCAP 206* 110* 551* 167* 148*   D-Dimer No results for input(s): DDIMER in the last 72 hours. Hgb A1c Recent Labs    10/30/20 2314  HGBA1C 6.7*   Lipid Profile No results for input(s): CHOL, HDL, LDLCALC, TRIG, CHOLHDL, LDLDIRECT in the last 72 hours. Thyroid function studies No results for input(s): TSH, T4TOTAL, T3FREE, THYROIDAB in the last 72 hours.  Invalid input(s): FREET3 Anemia work up No results for input(s): VITAMINB12, FOLATE, FERRITIN, TIBC, IRON, RETICCTPCT in the last 72 hours. Urinalysis    Component Value Date/Time   COLORURINE YELLOW 01/17/2020 1728   APPEARANCEUR CLOUDY (A) 01/17/2020 1728   LABSPEC 1.023 01/17/2020 1728   PHURINE 5.0 01/17/2020 1728   GLUCOSEU 50 (A) 01/17/2020 1728   HGBUR LARGE (A) 01/17/2020 1728   BILIRUBINUR NEGATIVE 01/17/2020 1728   KETONESUR NEGATIVE 01/17/2020 1728   PROTEINUR 30 (A) 01/17/2020 1728   NITRITE NEGATIVE 01/17/2020 1728   LEUKOCYTESUR NEGATIVE 01/17/2020 1728   Sepsis Labs Invalid input(s): PROCALCITONIN,  WBC,  LACTICIDVEN Microbiology Recent Results (from the past 240 hour(s))  SARS Coronavirus  2 by RT PCR (hospital order, performed in [place] hospital lab) Nasopharyngeal Nasopharyngeal Swab     Status: None   Collection Time: 10/30/20  1:01 PM   Specimen: Nasopharyngeal Swab  Result Value Ref Range Status   SARS Coronavirus 2 NEGATIVE NEGATIVE Final    Comment: (NOTE) SARS-CoV-2 target nucleic acids are NOT DETECTED.  The SARS-CoV-2 RNA is generally detectable in upper and lower respiratory specimens during the acute phase of infection. The lowest concentration of SARS-CoV-2 viral copies this assay can detect is 250 copies / mL. A negative result does not preclude SARS-CoV-2 infection and should not be used as the sole basis for treatment or other patient management decisions.  A negative result may occur with improper specimen collection / handling, submission of specimen other than nasopharyngeal swab, presence of viral mutation(s) within the areas targeted by this assay, and inadequate number of viral copies (<250 copies / mL). A negative result must be combined with clinical observations, patient history, and epidemiological information.  Fact Sheet for Patients:   StrictlyIdeas.no  Fact Sheet for Healthcare Providers: BankingDealers.co.za  This test is not yet approved or  cleared by the [place] FDA and has been authorized for detection and/or diagnosis of SARS-CoV-2 by FDA under an Emergency Use Authorization (EUA).  This EUA will remain in effect (meaning this test can be  used) for the duration of the COVID-19 declaration under Section 564(b)(1) of the Act, 21 U.S.C. section 360bbb-3(b)(1), unless the authorization is terminated or revoked sooner.  Performed at [facility], [address] [place], [place] [address]   Surgical PCR screen     Status: Abnormal   Collection Time: 10/31/20  1:08 AM   Specimen: Nasal Mucosa; Nasal Swab  Result Value Ref Range Status   MRSA, PCR NEGATIVE NEGATIVE  Final   Staphylococcus aureus POSITIVE (A) NEGATIVE Final    Comment: (NOTE) The Xpert SA Assay (FDA approved for NASAL specimens in patients 52 years of age and older), is one component of a comprehensive surveillance program. It is not intended to diagnose infection nor to guide or monitor treatment. Performed at [facility], [place] [address], [place], [place] [address]      Time coordinating discharge:  35 minutes  SIGNED:   Barb Merino, MD  [facility] 11/02/2020, 10:50 AM

## 2020-11-02 NOTE — Progress Notes (Signed)
Discharge instructions (including medications) discussed with and copy provided to patient/caregiver 

## 2020-11-02 NOTE — Discharge Instructions (Signed)
Orthopaedic Trauma Service Discharge Instructions   General Discharge Instructions  WEIGHT BEARING STATUS: Weightbearing as tolerated  RANGE OF MOTION/ACTIVITY: Ok for hip motion as tolerated  Wound Care: Incisions can be left open to air if there is no drainage. Okay to shower if no drainage from incisions.  - Do NOT apply any ointments, solutions or lotions to pin sites or surgical wounds.  These prevent needed drainage and even though solutions like hydrogen peroxide kill bacteria, they also damage cells lining the pin sites that help fight infection.  Applying lotions or ointments can keep the wounds moist and can cause them to breakdown and open up as well. This can increase the risk for infection. When in doubt call the office.  -Once the incision is completely dry and without drainage, it may be left open to air out.  Showering may begin 36-48 hours later.  Cleaning gently with soap and water.  DVT/PE prophylaxis: Aspirin 325 mg daily  Diet: as you were eating previously.  Can use over the counter stool softeners and bowel preparations, such as Miralax, to help with bowel movements.  Narcotics can be constipating.  Be sure to drink plenty of fluids  PAIN MEDICATION USE AND EXPECTATIONS  You have likely been given narcotic medications to help control your pain.  After a traumatic event that results in an fracture (broken bone) with or without surgery, it is ok to use narcotic pain medications to help control one's pain.  We understand that everyone responds to pain differently and each individual patient will be evaluated on a regular basis for the continued need for narcotic medications. Ideally, narcotic medication use should last no more than 6-8 weeks (coinciding with fracture healing).   As a patient it is your responsibility as well to monitor narcotic medication use and report the amount and frequency you use these medications when you come to your office visit.   We would also  advise that if you are using narcotic medications, you should take a dose prior to therapy to maximize you participation.  IF YOU ARE ON NARCOTIC MEDICATIONS IT IS NOT PERMISSIBLE TO OPERATE A MOTOR VEHICLE (MOTORCYCLE/CAR/TRUCK/MOPED) OR HEAVY MACHINERY DO NOT MIX NARCOTICS WITH OTHER CNS (CENTRAL NERVOUS SYSTEM) DEPRESSANTS SUCH AS ALCOHOL   STOP SMOKING OR USING NICOTINE PRODUCTS!!!!  As discussed nicotine severely impairs your body's ability to heal surgical and traumatic wounds but also impairs bone healing.  Wounds and bone heal by forming microscopic blood vessels (angiogenesis) and nicotine is a vasoconstrictor (essentially, shrinks blood vessels).  Therefore, if vasoconstriction occurs to these microscopic blood vessels they essentially disappear and are unable to deliver necessary nutrients to the healing tissue.  This is one modifiable factor that you can do to dramatically increase your chances of healing your injury.    (This means no smoking, no nicotine gum, patches, etc)  DO NOT USE NONSTEROIDAL ANTI-INFLAMMATORY DRUGS (NSAID'S)  Using products such as Advil (ibuprofen), Aleve (naproxen), Motrin (ibuprofen) for additional pain control during fracture healing can delay and/or prevent the healing response.  If you would like to take over the counter (OTC) medication, Tylenol (acetaminophen) is ok.  However, some narcotic medications that are given for pain control contain acetaminophen as well. Therefore, you should not exceed more than 4000 mg of tylenol in a day if you do not have liver disease.  Also note that there are may OTC medicines, such as cold medicines and allergy medicines that my contain tylenol as well.  If  you have any questions about medications and/or interactions please ask your doctor/PA or your pharmacist.      ICE AND ELEVATE INJURED/OPERATIVE EXTREMITY  Using ice and elevating the injured extremity above your heart can help with swelling and pain control.  Icing in  a pulsatile fashion, such as 20 minutes on and 20 minutes off, can be followed.    Do not place ice directly on skin. Make sure there is a barrier between to skin and the ice pack.    Using frozen items such as frozen peas works well as the conform nicely to the are that needs to be iced.  USE AN ACE WRAP OR TED HOSE FOR SWELLING CONTROL  In addition to icing and elevation, Ace wraps or TED hose are used to help limit and resolve swelling.  It is recommended to use Ace wraps or TED hose until you are informed to stop.    When using Ace Wraps start the wrapping distally (farthest away from the body) and wrap proximally (closer to the body)   Example: If you had surgery on your leg or thing and you do not have a splint on, start the ace wrap at the toes and work your way up to the thigh        If you had surgery on your upper extremity and do not have a splint on, start the ace wrap at your fingers and work your way up to the upper arm   [place]: [phone]   VISIT OUR WEBSITE FOR ADDITIONAL INFORMATION: [email or web address]

## 2020-11-02 NOTE — Plan of Care (Signed)
  Problem: Education: Goal: Knowledge of General Education information will improve Description: Including pain rating scale, medication(s)/side effects and non-pharmacologic comfort measures Outcome: Adequate for Discharge   

## 2020-11-02 NOTE — Progress Notes (Signed)
Orthopaedic Trauma Progress Note  SUBJECTIVE: Reports mild pain about operative site. No chest pain. No SOB. No nausea/vomiting. No other complaints. Has been moving well with therapies. Scheduled to be discharged later today  OBJECTIVE:  Vitals:   11/02/20 0413 11/02/20 0838  BP: (!) 144/75 (!) 145/74  Pulse: 84 88  Resp: 20 18  Temp: 98.5 F (36.9 C) 98.9 F (37.2 C)  SpO2: 95% 96%    General: Sitting up in bed, no acute distress Respiratory: No increased work of breathing.  Left lower extremity: Dressing clean, dry, intact. Tenderness over the hip as expected. Nontender in the knee or lower leg. Compartments soft and compressible. Tolerates gentle knee and hip motion. Ankle dorsiflexion and plantarflexion intact. Endorses sensation to light touch distally. Neurovascularly intact  IMAGING: Stable post op imaging.   LABS:  Results for orders placed or performed during the hospital encounter of 10/30/20 (from the past 24 hour(s))  Glucose, capillary     Status: Abnormal   Collection Time: 11/01/20 12:02 PM  Result Value Ref Range   Glucose-Capillary 206 (H) 70 - 99 mg/dL   Comment 1 Notify RN    Comment 2 Document in Chart   Glucose, capillary     Status: Abnormal   Collection Time: 11/01/20  6:02 PM  Result Value Ref Range   Glucose-Capillary 110 (H) 70 - 99 mg/dL  Glucose, capillary     Status: Abnormal   Collection Time: 11/01/20  9:21 PM  Result Value Ref Range   Glucose-Capillary 551 (HH) 70 - 99 mg/dL   Comment 1 Notify RN   Glucose, capillary     Status: Abnormal   Collection Time: 11/01/20 11:00 PM  Result Value Ref Range   Glucose-Capillary 167 (H) 70 - 99 mg/dL  Glucose, capillary     Status: Abnormal   Collection Time: 11/02/20  6:04 AM  Result Value Ref Range   Glucose-Capillary 148 (H) 70 - 99 mg/dL    ASSESSMENT: [name] is a 65 y.o. female, 2 Days Post-Op s/p CANNULATED LEFT HIP PINNING  CV/Blood loss: Hemoglobin  stable  PLAN: Weightbearing: WBAT LLE Incisional and dressing care: Okay to remove dressings and leave incisions open to air Showering: Okay to begin showering, incisions can get wet Orthopedic device(s): None  Pain management:  1. Tylenol 325-650 mg q 6 hours PRN 2. Robaxin 500 mg q 6 hours PRN 3. Norco 5-325 mg q 6 hours PRN moderate pain/Norco 7.5-325 mg q 6 hours PRN severe pain VTE prophylaxis: Lovenox, SCDs. Plan to discharge on aspirin 325 mg daily ID: Ancef 2gm post op completed Foley/Lines: No foley, KVO IVFs Impediments to Fracture Healing: Vit D level 22, start on D3 supplementation and continue this at discharge Dispo: Therapies as tolerated, PT/OT recommending HH therapies. Okay for discharge from ortho standpoint once cleared by medicine team and therapies. Have sent discharge Rx for pain medication, DVT prophylaxis, vitamin D supplementation to pharmacy Follow - up plan: 2 weeks  Contact information:  Katha Hamming MD, Patrecia Pace PA-C. After hours and holidays please check [email or web address] for group call information for [facility]   Mickie Kozikowski A. Ricci Barker, PA-C [phone] (office) [email or web address]

## 2020-11-03 LAB — ANGIOTENSIN CONVERTING ENZYME: Angiotensin-Converting Enzyme: 27 U/L (ref 14–82)

## 2021-03-10 ENCOUNTER — Other Ambulatory Visit: Payer: Medicare HMO

## 2021-05-19 ENCOUNTER — Emergency Department (HOSPITAL_COMMUNITY): Payer: Medicare HMO

## 2021-05-19 ENCOUNTER — Inpatient Hospital Stay (HOSPITAL_COMMUNITY): Payer: Medicare HMO

## 2021-05-19 ENCOUNTER — Inpatient Hospital Stay (HOSPITAL_COMMUNITY)
Admission: EM | Admit: 2021-05-19 | Discharge: 2021-05-27 | DRG: 208 | Disposition: E | Payer: Medicare HMO | Attending: Pulmonary Disease | Admitting: Pulmonary Disease

## 2021-05-19 DIAGNOSIS — I4891 Unspecified atrial fibrillation: Secondary | ICD-10-CM | POA: Diagnosis present

## 2021-05-19 DIAGNOSIS — F419 Anxiety disorder, unspecified: Secondary | ICD-10-CM | POA: Diagnosis present

## 2021-05-19 DIAGNOSIS — K852 Alcohol induced acute pancreatitis without necrosis or infection: Secondary | ICD-10-CM | POA: Diagnosis present

## 2021-05-19 DIAGNOSIS — E872 Acidosis: Secondary | ICD-10-CM | POA: Diagnosis present

## 2021-05-19 DIAGNOSIS — I469 Cardiac arrest, cause unspecified: Secondary | ICD-10-CM | POA: Diagnosis present

## 2021-05-19 DIAGNOSIS — I493 Ventricular premature depolarization: Secondary | ICD-10-CM | POA: Diagnosis present

## 2021-05-19 DIAGNOSIS — D696 Thrombocytopenia, unspecified: Secondary | ICD-10-CM | POA: Diagnosis not present

## 2021-05-19 DIAGNOSIS — Z8249 Family history of ischemic heart disease and other diseases of the circulatory system: Secondary | ICD-10-CM

## 2021-05-19 DIAGNOSIS — R4182 Altered mental status, unspecified: Secondary | ICD-10-CM

## 2021-05-19 DIAGNOSIS — Z515 Encounter for palliative care: Secondary | ICD-10-CM

## 2021-05-19 DIAGNOSIS — E1165 Type 2 diabetes mellitus with hyperglycemia: Secondary | ICD-10-CM | POA: Diagnosis present

## 2021-05-19 DIAGNOSIS — N179 Acute kidney failure, unspecified: Secondary | ICD-10-CM | POA: Diagnosis present

## 2021-05-19 DIAGNOSIS — J9690 Respiratory failure, unspecified, unspecified whether with hypoxia or hypercapnia: Secondary | ICD-10-CM

## 2021-05-19 DIAGNOSIS — E875 Hyperkalemia: Secondary | ICD-10-CM | POA: Diagnosis present

## 2021-05-19 DIAGNOSIS — Z96611 Presence of right artificial shoulder joint: Secondary | ICD-10-CM | POA: Diagnosis present

## 2021-05-19 DIAGNOSIS — R570 Cardiogenic shock: Secondary | ICD-10-CM | POA: Diagnosis present

## 2021-05-19 DIAGNOSIS — E876 Hypokalemia: Secondary | ICD-10-CM | POA: Diagnosis not present

## 2021-05-19 DIAGNOSIS — T50914A Poisoning by multiple unspecified drugs, medicaments and biological substances, undetermined, initial encounter: Secondary | ICD-10-CM | POA: Diagnosis present

## 2021-05-19 DIAGNOSIS — Z20822 Contact with and (suspected) exposure to covid-19: Secondary | ICD-10-CM | POA: Diagnosis present

## 2021-05-19 DIAGNOSIS — T17908A Unspecified foreign body in respiratory tract, part unspecified causing other injury, initial encounter: Secondary | ICD-10-CM | POA: Diagnosis not present

## 2021-05-19 DIAGNOSIS — Z825 Family history of asthma and other chronic lower respiratory diseases: Secondary | ICD-10-CM | POA: Diagnosis not present

## 2021-05-19 DIAGNOSIS — Z85118 Personal history of other malignant neoplasm of bronchus and lung: Secondary | ICD-10-CM

## 2021-05-19 DIAGNOSIS — Z66 Do not resuscitate: Secondary | ICD-10-CM | POA: Diagnosis present

## 2021-05-19 DIAGNOSIS — G928 Other toxic encephalopathy: Secondary | ICD-10-CM | POA: Diagnosis present

## 2021-05-19 DIAGNOSIS — Y848 Other medical procedures as the cause of abnormal reaction of the patient, or of later complication, without mention of misadventure at the time of the procedure: Secondary | ICD-10-CM | POA: Diagnosis not present

## 2021-05-19 DIAGNOSIS — E8729 Other acidosis: Secondary | ICD-10-CM

## 2021-05-19 DIAGNOSIS — I468 Cardiac arrest due to other underlying condition: Secondary | ICD-10-CM | POA: Diagnosis present

## 2021-05-19 DIAGNOSIS — Z882 Allergy status to sulfonamides status: Secondary | ICD-10-CM

## 2021-05-19 DIAGNOSIS — Z9152 Personal history of nonsuicidal self-harm: Secondary | ICD-10-CM

## 2021-05-19 DIAGNOSIS — E860 Dehydration: Secondary | ICD-10-CM | POA: Diagnosis present

## 2021-05-19 DIAGNOSIS — I10 Essential (primary) hypertension: Secondary | ICD-10-CM | POA: Diagnosis present

## 2021-05-19 DIAGNOSIS — Z833 Family history of diabetes mellitus: Secondary | ICD-10-CM

## 2021-05-19 DIAGNOSIS — K920 Hematemesis: Secondary | ICD-10-CM | POA: Diagnosis present

## 2021-05-19 DIAGNOSIS — R778 Other specified abnormalities of plasma proteins: Secondary | ICD-10-CM | POA: Diagnosis present

## 2021-05-19 DIAGNOSIS — R278 Other lack of coordination: Secondary | ICD-10-CM | POA: Diagnosis present

## 2021-05-19 DIAGNOSIS — D689 Coagulation defect, unspecified: Secondary | ICD-10-CM | POA: Diagnosis present

## 2021-05-19 DIAGNOSIS — F1010 Alcohol abuse, uncomplicated: Secondary | ICD-10-CM | POA: Diagnosis present

## 2021-05-19 DIAGNOSIS — G43909 Migraine, unspecified, not intractable, without status migrainosus: Secondary | ICD-10-CM | POA: Diagnosis present

## 2021-05-19 DIAGNOSIS — Z79899 Other long term (current) drug therapy: Secondary | ICD-10-CM

## 2021-05-19 DIAGNOSIS — J80 Acute respiratory distress syndrome: Secondary | ICD-10-CM | POA: Diagnosis present

## 2021-05-19 DIAGNOSIS — Z794 Long term (current) use of insulin: Secondary | ICD-10-CM

## 2021-05-19 DIAGNOSIS — I4901 Ventricular fibrillation: Secondary | ICD-10-CM | POA: Diagnosis present

## 2021-05-19 DIAGNOSIS — Z978 Presence of other specified devices: Secondary | ICD-10-CM | POA: Diagnosis present

## 2021-05-19 DIAGNOSIS — J95851 Ventilator associated pneumonia: Secondary | ICD-10-CM | POA: Diagnosis not present

## 2021-05-19 LAB — RESP PANEL BY RT-PCR (FLU A&B, COVID) ARPGX2
Influenza A by PCR: NEGATIVE
Influenza B by PCR: NEGATIVE
SARS Coronavirus 2 by RT PCR: NEGATIVE

## 2021-05-19 LAB — I-STAT CHEM 8, ED
BUN: 81 mg/dL — ABNORMAL HIGH (ref 8–23)
BUN: 76 mg/dL — ABNORMAL HIGH (ref 8–23)
Calcium, Ion: 0.93 mmol/L — ABNORMAL LOW (ref 1.15–1.40)
Calcium, Ion: 1.13 mmol/L — ABNORMAL LOW (ref 1.15–1.40)
Chloride: 106 mmol/L (ref 98–111)
Chloride: 105 mmol/L (ref 98–111)
Creatinine, Ser: 5.4 mg/dL — ABNORMAL HIGH (ref 0.44–1.00)
Creatinine, Ser: 4.8 mg/dL — ABNORMAL HIGH (ref 0.44–1.00)
Glucose, Bld: 125 mg/dL — ABNORMAL HIGH (ref 70–99)
Glucose, Bld: 287 mg/dL — ABNORMAL HIGH (ref 70–99)
HCT: 41 % (ref 36.0–46.0)
HCT: 32 % — ABNORMAL LOW (ref 36.0–46.0)
Hemoglobin: 13.9 g/dL (ref 12.0–15.0)
Hemoglobin: 10.9 g/dL — ABNORMAL LOW (ref 12.0–15.0)
Potassium: 7.1 mmol/L (ref 3.5–5.1)
Potassium: 5 mmol/L (ref 3.5–5.1)
Sodium: 127 mmol/L — ABNORMAL LOW (ref 135–145)
Sodium: 133 mmol/L — ABNORMAL LOW (ref 135–145)
TCO2: 8 mmol/L — ABNORMAL LOW (ref 22–32)
TCO2: 11 mmol/L — ABNORMAL LOW (ref 22–32)

## 2021-05-19 LAB — CBC WITH DIFFERENTIAL/PLATELET
Abs Immature Granulocytes: 0.41 10*3/uL — ABNORMAL HIGH (ref 0.00–0.07)
Basophils Absolute: 0.1 10*3/uL (ref 0.0–0.1)
Basophils Relative: 0 %
Eosinophils Absolute: 0 10*3/uL (ref 0.0–0.5)
Eosinophils Relative: 0 %
HCT: 37 % (ref 36.0–46.0)
Hemoglobin: 11.8 g/dL — ABNORMAL LOW (ref 12.0–15.0)
Immature Granulocytes: 2 %
Lymphocytes Relative: 8 %
Lymphs Abs: 2.1 10*3/uL (ref 0.7–4.0)
MCH: 32.8 pg (ref 26.0–34.0)
MCHC: 31.9 g/dL (ref 30.0–36.0)
MCV: 102.8 fL — ABNORMAL HIGH (ref 80.0–100.0)
Monocytes Absolute: 0.4 10*3/uL (ref 0.1–1.0)
Monocytes Relative: 2 %
Neutro Abs: 22.8 10*3/uL — ABNORMAL HIGH (ref 1.7–7.7)
Neutrophils Relative %: 88 %
Platelets: 184 10*3/uL (ref 150–400)
RBC: 3.6 MIL/uL — ABNORMAL LOW (ref 3.87–5.11)
RDW: 13.6 % (ref 11.5–15.5)
WBC: 25.8 10*3/uL — ABNORMAL HIGH (ref 4.0–10.5)
nRBC: 0.1 % (ref 0.0–0.2)

## 2021-05-19 LAB — RAPID URINE DRUG SCREEN, HOSP PERFORMED
Amphetamines: NOT DETECTED
Barbiturates: POSITIVE — AB
Benzodiazepines: NOT DETECTED
Cocaine: NOT DETECTED
Opiates: NOT DETECTED
Tetrahydrocannabinol: NOT DETECTED

## 2021-05-19 LAB — SALICYLATE LEVEL: Salicylate Lvl: <7 mg/dL — ABNORMAL LOW (ref 7.0–30.0)

## 2021-05-19 LAB — COMPREHENSIVE METABOLIC PANEL
ALT: 50 U/L — ABNORMAL HIGH (ref 0–44)
AST: 128 U/L — ABNORMAL HIGH (ref 15–41)
Albumin: 2.1 g/dL — ABNORMAL LOW (ref 3.5–5.0)
Alkaline Phosphatase: 132 U/L — ABNORMAL HIGH (ref 38–126)
BUN: 66 mg/dL — ABNORMAL HIGH (ref 8–23)
CO2: <7 mmol/L — ABNORMAL LOW (ref 22–32)
Calcium: 8.5 mg/dL — ABNORMAL LOW (ref 8.9–10.3)
Chloride: 98 mmol/L (ref 98–111)
Creatinine, Ser: 4.92 mg/dL — ABNORMAL HIGH (ref 0.44–1.00)
GFR, Estimated: 9 mL/min — ABNORMAL LOW (ref >60)
Glucose, Bld: 241 mg/dL — ABNORMAL HIGH (ref 70–99)
Potassium: 4.4 mmol/L (ref 3.5–5.1)
Sodium: 137 mmol/L (ref 135–145)
Total Bilirubin: 1 mg/dL (ref 0.3–1.2)
Total Protein: 4.4 g/dL — ABNORMAL LOW (ref 6.5–8.1)

## 2021-05-19 LAB — BASIC METABOLIC PANEL
BUN: 63 mg/dL — ABNORMAL HIGH (ref 8–23)
CO2: <7 mmol/L — ABNORMAL LOW (ref 22–32)
Calcium: 8 mg/dL — ABNORMAL LOW (ref 8.9–10.3)
Chloride: 97 mmol/L — ABNORMAL LOW (ref 98–111)
Creatinine, Ser: 4.79 mg/dL — ABNORMAL HIGH (ref 0.44–1.00)
GFR, Estimated: 9 mL/min — ABNORMAL LOW (ref >60)
Glucose, Bld: 187 mg/dL — ABNORMAL HIGH (ref 70–99)
Potassium: 5.2 mmol/L — ABNORMAL HIGH (ref 3.5–5.1)
Sodium: 134 mmol/L — ABNORMAL LOW (ref 135–145)

## 2021-05-19 LAB — I-STAT ARTERIAL BLOOD GAS, ED
Acid-base deficit: 30 mmol/L — ABNORMAL HIGH (ref 0.0–2.0)
Acid-base deficit: 23 mmol/L — ABNORMAL HIGH (ref 0.0–2.0)
Bicarbonate: 4.7 mmol/L — ABNORMAL LOW (ref 20.0–28.0)
Bicarbonate: 6.5 mmol/L — ABNORMAL LOW (ref 20.0–28.0)
Calcium, Ion: 1.07 mmol/L — ABNORMAL LOW (ref 1.15–1.40)
Calcium, Ion: 1.02 mmol/L — ABNORMAL LOW (ref 1.15–1.40)
HCT: 33 % — ABNORMAL LOW (ref 36.0–46.0)
HCT: 35 % — ABNORMAL LOW (ref 36.0–46.0)
Hemoglobin: 11.2 g/dL — ABNORMAL LOW (ref 12.0–15.0)
Hemoglobin: 11.9 g/dL — ABNORMAL LOW (ref 12.0–15.0)
O2 Saturation: 100 %
O2 Saturation: 96 %
Patient temperature: 95
Patient temperature: 95.7
Potassium: 3.8 mmol/L (ref 3.5–5.1)
Potassium: 4.3 mmol/L (ref 3.5–5.1)
Sodium: 134 mmol/L — ABNORMAL LOW (ref 135–145)
Sodium: 129 mmol/L — ABNORMAL LOW (ref 135–145)
TCO2: 6 mmol/L — ABNORMAL LOW (ref 22–32)
TCO2: 7 mmol/L — ABNORMAL LOW (ref 22–32)
pCO2 arterial: 30.3 mmHg — ABNORMAL LOW (ref 32.0–48.0)
pCO2 arterial: 23.5 mmHg — ABNORMAL LOW (ref 32.0–48.0)
pH, Arterial: 6.781 — CL (ref 7.350–7.450)
pH, Arterial: 7.038 — CL (ref 7.350–7.450)
pO2, Arterial: 460 mmHg — ABNORMAL HIGH (ref 83.0–108.0)
pO2, Arterial: 108 mmHg (ref 83.0–108.0)

## 2021-05-19 LAB — I-STAT VENOUS BLOOD GAS, ED
Acid-base deficit: 26 mmol/L — ABNORMAL HIGH (ref 0.0–2.0)
Bicarbonate: 7 mmol/L — ABNORMAL LOW (ref 20.0–28.0)
Calcium, Ion: 1.13 mmol/L — ABNORMAL LOW (ref 1.15–1.40)
HCT: 31 % — ABNORMAL LOW (ref 36.0–46.0)
Hemoglobin: 10.5 g/dL — ABNORMAL LOW (ref 12.0–15.0)
O2 Saturation: 64 %
Potassium: 5 mmol/L (ref 3.5–5.1)
Sodium: 132 mmol/L — ABNORMAL LOW (ref 135–145)
TCO2: 8 mmol/L — ABNORMAL LOW (ref 22–32)
pCO2, Ven: 39.4 mmHg — ABNORMAL LOW (ref 44.0–60.0)
pH, Ven: 6.861 — CL (ref 7.250–7.430)
pO2, Ven: 57 mmHg — ABNORMAL HIGH (ref 32.0–45.0)

## 2021-05-19 LAB — TYPE AND SCREEN
ABO/RH(D): A POS
Antibody Screen: NEGATIVE

## 2021-05-19 LAB — CBG MONITORING, ED
Glucose-Capillary: 150 mg/dL — ABNORMAL HIGH (ref 70–99)
Glucose-Capillary: 199 mg/dL — ABNORMAL HIGH (ref 70–99)

## 2021-05-19 LAB — URINALYSIS, ROUTINE W REFLEX MICROSCOPIC
Bilirubin Urine: NEGATIVE
Glucose, UA: 50 mg/dL — AB
Ketones, ur: 5 mg/dL — AB
Nitrite: NEGATIVE
Protein, ur: 100 mg/dL — AB
RBC / HPF: >50 RBC/hpf — ABNORMAL HIGH (ref 0–5)
Specific Gravity, Urine: 1.025 (ref 1.005–1.030)
pH: 5 (ref 5.0–8.0)

## 2021-05-19 LAB — TROPONIN I (HIGH SENSITIVITY)
Troponin I (High Sensitivity): 235 ng/L (ref <18)
Troponin I (High Sensitivity): 641 ng/L (ref <18)

## 2021-05-19 LAB — HIV ANTIBODY (ROUTINE TESTING W REFLEX): HIV Screen 4th Generation wRfx: NONREACTIVE

## 2021-05-19 LAB — PROTIME-INR
INR: 1.7 — ABNORMAL HIGH (ref 0.8–1.2)
Prothrombin Time: 20.3 seconds — ABNORMAL HIGH (ref 11.4–15.2)

## 2021-05-19 LAB — ACETAMINOPHEN LEVEL: Acetaminophen (Tylenol), Serum: <10 ug/mL — ABNORMAL LOW (ref 10–30)

## 2021-05-19 LAB — LACTIC ACID, PLASMA: Lactic Acid, Venous: >11 mmol/L (ref 0.5–1.9)

## 2021-05-19 MED ORDER — SODIUM CHLORIDE 0.9 % IV SOLN
INTRAVENOUS | Status: AC | PRN
  Administered 2021-05-19 (×2): 1000 mL via INTRAVENOUS

## 2021-05-19 MED ORDER — PANTOPRAZOLE SODIUM 40 MG IV SOLR
40.0000 mg | Freq: Every day | INTRAVENOUS | Status: DC
  Administered 2021-05-19: 40 mg via INTRAVENOUS
  Filled 2021-05-19: qty 40

## 2021-05-19 MED ORDER — FOLIC ACID 5 MG/ML IJ SOLN
1.0000 mg | Freq: Every day | INTRAMUSCULAR | Status: DC
  Administered 2021-05-20: 1 mg via INTRAVENOUS
  Filled 2021-05-19 (×3): qty 0.2

## 2021-05-19 MED ORDER — SODIUM BICARBONATE 8.4 % IV SOLN
INTRAVENOUS | Status: AC | PRN
  Administered 2021-05-19: 50 meq via INTRAVENOUS

## 2021-05-19 MED ORDER — DEXTROSE 50 % IV SOLN
INTRAVENOUS | Status: AC | PRN
  Administered 2021-05-19: 50 mL via INTRAVENOUS

## 2021-05-19 MED ORDER — SODIUM CHLORIDE 0.9 % IV SOLN: 250.0000 mL | INTRAVENOUS | Status: DC

## 2021-05-19 MED ORDER — VANCOMYCIN HCL 1500 MG/300ML IV SOLN
1500.0000 mg | Freq: Once | INTRAVENOUS | Status: AC
  Administered 2021-05-19: 1500 mg via INTRAVENOUS
  Filled 2021-05-19: qty 300

## 2021-05-19 MED ORDER — PROPOFOL 1000 MG/100ML IV EMUL: 0.0000 ug/kg/min | INTRAVENOUS | Status: DC

## 2021-05-19 MED ORDER — FENTANYL 2500MCG IN NS 250ML (10MCG/ML) PREMIX INFUSION
0.0000 ug/h | INTRAVENOUS | Status: DC
  Administered 2021-05-20: 300 ug/h via INTRAVENOUS
  Administered 2021-05-20: 100 ug/h via INTRAVENOUS
  Filled 2021-05-19: qty 250

## 2021-05-19 MED ORDER — SODIUM CHLORIDE 0.9 % IV SOLN
2.0000 g | INTRAVENOUS | Status: DC
  Administered 2021-05-19: 2 g via INTRAVENOUS
  Filled 2021-05-19: qty 2

## 2021-05-19 MED ORDER — NOREPINEPHRINE 4 MG/250ML-% IV SOLN
0.0000 ug/min | INTRAVENOUS | Status: DC
  Administered 2021-05-19 (×2): 20 ug/min via INTRAVENOUS
  Administered 2021-05-20: 50 ug/min via INTRAVENOUS
  Filled 2021-05-19 (×3): qty 250

## 2021-05-19 MED ORDER — THIAMINE HCL 100 MG/ML IJ SOLN
100.0000 mg | Freq: Every day | INTRAMUSCULAR | Status: DC
  Administered 2021-05-19 – 2021-05-20 (×2): 100 mg via INTRAVENOUS
  Filled 2021-05-19: qty 2

## 2021-05-19 MED ORDER — VANCOMYCIN VARIABLE DOSE PER UNSTABLE RENAL FUNCTION (PHARMACIST DOSING): Status: DC

## 2021-05-19 MED ORDER — POLYETHYLENE GLYCOL 3350 17 G PO PACK: 17.0000 g | PACK | Freq: Every day | ORAL | Status: DC

## 2021-05-19 MED ORDER — ONDANSETRON HCL 4 MG/2ML IJ SOLN: 4.0000 mg | Freq: Four times a day (QID) | INTRAMUSCULAR | Status: DC | PRN

## 2021-05-19 MED ORDER — MAGNESIUM SULFATE 50 % IJ SOLN
INTRAMUSCULAR | Status: AC | PRN
  Administered 2021-05-19: 2 g via INTRAVENOUS

## 2021-05-19 MED ORDER — DOCUSATE SODIUM 50 MG/5ML PO LIQD: 100.0000 mg | Freq: Two times a day (BID) | ORAL | Status: DC

## 2021-05-19 MED ORDER — LORAZEPAM 2 MG/ML IJ SOLN
INTRAMUSCULAR | Status: AC
  Administered 2021-05-19: 2 mg
  Filled 2021-05-19: qty 1

## 2021-05-19 MED ORDER — FENTANYL 2500MCG IN NS 250ML (10MCG/ML) PREMIX INFUSION
25.0000 ug/h | INTRAVENOUS | Status: DC
  Filled 2021-05-19: qty 250

## 2021-05-19 MED ORDER — SODIUM BICARBONATE 8.4 % IV SOLN
INTRAVENOUS | Status: AC | PRN
  Administered 2021-05-19 (×2): 50 meq via INTRAVENOUS

## 2021-05-19 MED ORDER — EPINEPHRINE 1 MG/10ML IJ SOSY
PREFILLED_SYRINGE | INTRAMUSCULAR | Status: AC | PRN
  Administered 2021-05-19 (×4): 1 mg via INTRAVENOUS

## 2021-05-19 MED ORDER — FENTANYL CITRATE PF 50 MCG/ML IJ SOSY
50.0000 ug | PREFILLED_SYRINGE | Freq: Once | INTRAMUSCULAR | Status: AC
  Administered 2021-05-19: 50 ug via INTRAVENOUS
  Filled 2021-05-19: qty 1

## 2021-05-19 MED ORDER — FENTANYL BOLUS VIA INFUSION
25.0000 ug | INTRAVENOUS | Status: DC | PRN
Start: 2021-05-19 — End: 2021-05-19
  Filled 2021-05-19: qty 25

## 2021-05-19 MED ORDER — ATROPINE SULFATE 1 MG/ML IJ SOLN
INTRAMUSCULAR | Status: AC | PRN
  Administered 2021-05-19: 1 mg via INTRAVENOUS

## 2021-05-19 MED ORDER — SODIUM BICARBONATE 4.2 % IV SOLN
50.0000 meq | Freq: Once | INTRAVENOUS | Status: DC
  Filled 2021-05-19: qty 100

## 2021-05-19 MED ORDER — EPINEPHRINE HCL 5 MG/250ML IV SOLN IN NS
0.5000 ug/min | INTRAVENOUS | Status: DC
  Administered 2021-05-19 (×2): 20 ug/min via INTRAVENOUS
  Administered 2021-05-20: 30 ug/min via INTRAVENOUS
  Filled 2021-05-19 (×3): qty 250

## 2021-05-19 MED ORDER — SODIUM BICARBONATE 8.4 % IV SOLN: INTRAVENOUS | Status: DC

## 2021-05-19 MED ORDER — FENTANYL BOLUS VIA INFUSION
25.0000 ug | INTRAVENOUS | Status: DC | PRN
  Filled 2021-05-19: qty 100

## 2021-05-19 MED ORDER — HEPARIN SODIUM (PORCINE) 5000 UNIT/ML IJ SOLN
5000.0000 [IU] | Freq: Three times a day (TID) | INTRAMUSCULAR | Status: DC
  Administered 2021-05-19: 5000 [IU] via SUBCUTANEOUS
  Filled 2021-05-19: qty 1

## 2021-05-19 MED ORDER — SODIUM BICARBONATE 8.4 % IV SOLN
INTRAVENOUS | Status: DC
  Filled 2021-05-19 (×3): qty 1000

## 2021-05-19 NOTE — Progress Notes (Signed)
Pharmacy Antibiotic Note  [name] is a 65 y.o. female admitted on 05/14/2021 with sepsis.  Pharmacy has been consulted for vancomycin and cefepime dosing.  Patient presenting to ED via EMS after being found unresponsive at her home with multiple medications all over the bed on EMS arrival. Patient coded shortly after arrival to ED with roughly 20 minutes prior to ROSC.  Patient's serum creatinine is 4.8 which is significantly above baseline.  Plan: Cefepime 2g q24h unless change in renal function Vancomycin 1500 mg once, subsequent dosing as indicated per random vancomycin level until renal function stable and/or improved, at which time scheduled dosing can be considered Follow-up cultures and de-escalate antibiotics as appropriate.     No data recorded.  Recent Labs  Lab 05/11/2021 1759 05/12/2021 1831  CREATININE 5.40* 4.80*    CrCl cannot be calculated (Unknown ideal weight.).    Allergies  Allergen Reactions   Ciprofloxacin Diarrhea    Antimicrobials this admission: cefepime 8/24 >>  vancomycin 8/24 >>   Microbiology results: Pending  Thank you for allowing pharmacy to be a part of this patient's care.  Lorelei Pont, PharmD, BCPS 05/22/2021 6:57 PM ED Clinical Pharmacist -  [phone]

## 2021-05-19 NOTE — ED Triage Notes (Signed)
Pt from home-EMS called earlier today because of anxiety this morning r/t cataract surgery and then family called EMS again later today d/t AMS. Pt has hx of overdose and had multiple medications just lying all over the bed on EMS arrival. Pt unresponsive and being paced on arrival to ED, less responsive than at the beginning of their transport.

## 2021-05-19 NOTE — Code Documentation (Signed)
10 U IV insulin given

## 2021-05-19 NOTE — ED Provider Notes (Signed)
[facility] EMERGENCY DEPARTMENT Provider Note   CSN: [id number] Arrival date & time: 05/10/2021  1737     History Chief Complaint  Patient presents with   Altered Mental Status    [name] is a 65 y.o. female.  65 year old female with prior medical history detailed below presents in extremis by EMS.  EMS reports that the patient was called for weakness.  She has been at least for today.  She refused transport by EMS earlier today.  Upon arrival to the ED the patient is unable to provide history secondary to her illness.  She is minimally responsive.  EMS reports that her initial heart rate was 30 with a systolic blood pressure of 50.  EMS initiated pacing while in route to the hospital.  Patient lost pulses within 1 minute of being transferred over to the ED stretcher.  CPR initiated immediately.  Patient was intubated.  Access obtained.  Resuscitation efforts were ultimately successful with ROSC at 20 minutes of CPR.  Patient's husband reports prior history of possible ODs in the past.  No witnessed or likely overdose today.  The history is provided by the patient.  Altered Mental Status Presenting symptoms: unresponsiveness   Severity:  Severe Most recent episode:  Today Episode history:  Unable to specify Timing:  Unable to specify Progression:  Worsening     Past Medical History:  Diagnosis Date   Alcohol abuse    Anxiety    Cancer ([place])    lung   Diabetes mellitus    Headache(784.0)    Hypertension    Pancreatitis, acute     Patient Active Problem List   Diagnosis Date Noted   Lymphadenopathy, mediastinal 11/02/2020   Closed left hip fracture ([place]) 10/30/2020   Compression fracture of thoracic vertebra ([place]) 10/30/2020   Sepsis ([place]) 01/17/2020   Ketoacidosis 01/17/2020   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) 123456   HTN (hypertension) 04/11/2012   Alcohol abuse 04/11/2012   Hx of cancer of lung 04/11/2012   Hyponatremia 04/11/2012   Fracture of left humerus 04/11/2012   Pancreatitis, acute     Past Surgical History:  Procedure Laterality Date   APPENDECTOMY     CESAREAN SECTION     HIP PINNING,CANNULATED Left 10/31/2020   Procedure: CANNULATED HIP PINNING;  Surgeon: Shona Needles, MD;  Location: [facility];  Service: Orthopedics;  Laterality: Left;   JOINT REPLACEMENT     rt shoulder   LUNG LOBECTOMY     ORIF HUMERUS FRACTURE  04/13/2012   Procedure: OPEN REDUCTION INTERNAL FIXATION (ORIF) PROXIMAL HUMERUS FRACTURE;  Surgeon: Augustin Schooling, MD;  Location: [place];  Service: Orthopedics;  Laterality: Left;  open reduction internal fixation left proximal humerus fracture   SHOULDER SURGERY       OB History   No obstetric history on file.     Family History  Problem Relation Age of Onset   Hypertension Mother    COPD Father    Diabetes Father     Social History   Tobacco Use   Smoking status: Never   Smokeless tobacco: Never  Substance Use Topics   Alcohol use: Yes   Drug use: No    Home Medications Prior to Admission medications   Medication Sig Start Date End Date Taking? Authorizing Provider  atorvastatin (LIPITOR) 10 MG tablet  Take 10 mg by mouth daily.  04/18/16   [provider]  cholecalciferol (VITAMIN D) 25 MCG tablet Take 1 tablet (1,000 Units total) by mouth daily. 11/02/20   Delray Alt, PA-C  ciclopirox (PENLAC) 8 % solution Apply 1 application topically at bedtime.    [provider]  diltiazem (CARDIZEM CD) 180 MG 24 hr capsule Take 360 mg by mouth daily.    [provider]  DULoxetine (CYMBALTA) 20 MG capsule Take 20 mg by mouth daily. 10/22/20   [provider]  HYDROcodone-acetaminophen (NORCO) 7.5-325 MG tablet Take 1-2 tablets by mouth every 6 (six) hours as needed for severe pain. 11/02/20   Delray Alt, PA-C  insulin aspart (NOVOLOG FLEXPEN) 100 UNIT/ML FlexPen Inject 0-10 Units into the skin daily as needed for high blood sugar.    [provider]  Insulin Pen Needle 29G X 12MM MISC Dispense insulin pen needles 04/16/12   Laza, Sorin C, MD  ketoconazole (NIZORAL) 2 % cream Apply 1 application topically daily. 05/11/20   [provider]  naltrexone (DEPADE) 50 MG tablet Take 1 tablet by mouth daily. 10/05/20   [provider]  rizatriptan (MAXALT) 10 MG tablet Take 10 mg by mouth as needed for migraine. 10/22/20 10/23/21  [provider]  TOUJEO SOLOSTAR 300 UNIT/ML Solostar Pen Inject 24 Units into the skin daily.  11/18/19   [provider]  vitamin B-12 (CYANOCOBALAMIN) 1000 MCG tablet Take 1,000 mcg by mouth daily.    [provider]  vitamin C (ASCORBIC ACID) 500 MG tablet Take 500 mg by mouth daily.    [provider]    Allergies    Ciprofloxacin  Review of Systems   Review of Systems  Unable to perform ROS: Acuity of condition   Physical Exam Updated Vital Signs BP (!) 120/49   Pulse 87   Resp (!) 26   SpO2 100%   Physical Exam Vitals and nursing note reviewed.  Constitutional:      General: She is not in acute distress.    Appearance: She is well-developed.     Comments: Ill-appearing, transcutaneous pacing in progress, unresponsive  HENT:     Head: Normocephalic and atraumatic.     Mouth/Throat:     Mouth: Mucous membranes are dry.  Eyes:     Pupils: Pupils are equal, round, and reactive to light.     Comments: Pale conjunctiva   Cardiovascular:     Rate and Rhythm: Normal rate and regular rhythm.     Heart sounds: Normal heart sounds.  Pulmonary:     Effort: Pulmonary effort is normal. No respiratory distress.     Breath sounds: Normal breath sounds.  Abdominal:     General: There is no distension.     Palpations: Abdomen is soft.     Tenderness: There is no abdominal tenderness.   Musculoskeletal:        General: No deformity. Normal range of motion.     Cervical back: Normal range of motion and neck supple.  Skin:    General: Skin is warm and dry.    ED Results / Procedures / Treatments   Labs (all labs ordered are listed, but only abnormal results are displayed) Labs Reviewed  I-STAT CHEM 8, ED - Abnormal; Notable for the following components:      Result Value   Sodium 127 (*)    Potassium 7.1 (*)    BUN 81 (*)    Creatinine,  Ser 5.40 (*)    Glucose, Bld 125 (*)    Calcium, Ion 0.93 (*)    TCO2 8 (*)    All other components within normal limits  I-STAT CHEM 8, ED - Abnormal; Notable for the following components:   Sodium 133 (*)    BUN 76 (*)    Creatinine, Ser 4.80 (*)    Glucose, Bld 287 (*)    Calcium, Ion 1.13 (*)    TCO2 11 (*)    Hemoglobin 10.9 (*)    HCT 32.0 (*)    All other components within normal limits  I-STAT VENOUS BLOOD GAS, ED - Abnormal; Notable for the following components:   pH, Ven 6.861 (*)    pCO2, Ven 39.4 (*)    pO2, Ven 57.0 (*)    Bicarbonate 7.0 (*)    TCO2 8 (*)    Acid-base deficit 26.0 (*)    Sodium 132 (*)    Calcium, Ion 1.13 (*)    HCT 31.0 (*)    Hemoglobin 10.5 (*)    All other components within normal limits  RESP PANEL BY RT-PCR (FLU A&B, COVID) ARPGX2  COMPREHENSIVE METABOLIC PANEL  ETHANOL  LACTIC ACID, PLASMA  LACTIC ACID, PLASMA  CBC WITH DIFFERENTIAL/PLATELET  PROTIME-INR  URINALYSIS, ROUTINE W REFLEX MICROSCOPIC  CBG MONITORING, ED  TYPE AND SCREEN  TROPONIN I (HIGH SENSITIVITY)    EKG EKG Interpretation  Date/Time:  Wednesday May 19 2021 18:48:33 EDT Ventricular Rate:  86 PR Interval:  248 QRS Duration: 107 QT Interval:  376 QTC Calculation: 450 R Axis:   -42 Text Interpretation: Sinus or ectopic atrial rhythm Prolonged PR interval Left axis deviation Anteroseptal infarct, age indeterminate ST elevation, consider inferior injury Lateral leads are also involved Confirmed by  Dene Gentry [phone]) on 05/03/2021 6:49:40 PM  Radiology No results found.  Procedures Procedures   CRITICAL CARE Performed by: Valarie Merino   Total critical care time: 60 minutes  Critical care time was exclusive of separately billable procedures and treating other patients.  Critical care was necessary to treat or prevent imminent or life-threatening deterioration.  Critical care was time spent personally by me on the following activities: development of treatment plan with patient and/or surrogate as well as nursing, discussions with consultants, evaluation of patient's response to treatment, examination of patient, obtaining history from patient or surrogate, ordering and performing treatments and interventions, ordering and review of laboratory studies, ordering and review of radiographic studies, pulse oximetry and re-evaluation of patient's condition.   Medications Ordered in ED Medications  sodium bicarbonate 150 mEq in dextrose 5 % 1,150 mL infusion ( Intravenous New Bag/Given 05/10/2021 1852)  EPINEPHrine (ADRENALIN) 5 mg in NS 250 mL (0.02 mg/mL) premix infusion (20 mcg/min Intravenous New Bag/Given 04/28/2021 1832)  norepinephrine (LEVOPHED) '4mg'$  in 265m premix infusion (20 mcg/min Intravenous New Bag/Given 05/14/2021 1831)  fentaNYL (SUBLIMAZE) injection 50 mcg (has no administration in time range)  fentaNYL 25075m in NS 25022m57m43ml) infusion-PREMIX (has no administration in time range)  fentaNYL (SUBLIMAZE) bolus via infusion 25 mcg (has no administration in time range)  atropine injection (1 mg Intravenous Given 05/22/2021 1744)  EPINEPHrine (ADRENALIN) 1 MG/10ML injection (1 mg Intravenous Given 05/08/2021 1758)  sodium bicarbonate injection (50 mEq Intravenous Given 05/06/2021 1803)  magnesium sulfate (IV Push/IM) injection (2 g Intravenous Given 05/06/2021 1800)  dextrose 50 % solution (50 mLs Intravenous Given 05/12/2021 1807)  sodium bicarbonate injection (50 mEq Intravenous  Given 05/06/2021 1807)  LORazepam (ATIVAN)  2 MG/ML injection (2 mg  Given 05/16/2021 1846)    ED Course  I have reviewed the triage vital signs and the nursing notes.  Pertinent labs & imaging results that were available during my care of the patient were reviewed by me and considered in my medical decision making (see chart for details).    MDM Rules/Calculators/A&P                           MDM  MSE complete  JET DIETIKER was evaluated in Emergency Department on 05/14/2021 for the symptoms described in the history of present illness. She was evaluated in the context of the global COVID-19 pandemic, which necessitated consideration that the patient might be at risk for infection with the SARS-CoV-2 virus that causes COVID-19. Institutional protocols and algorithms that pertain to the evaluation of patients at risk for COVID-19 are in a state of rapid change based on information released by regulatory bodies including the CDC and federal and state organizations. These policies and algorithms were followed during the patient's care in the ED.  Patient presented in extremis with apparent weakness, bradycardia, and hypotension  Patient arrested within minutes of arrival to ED.  Patient was intubated and CPR was initiated in the ED.  Patient's i-STAT suggested hyperkalemia which was consistent with the patient's bradycardia.  Aggressive treatment initiated for suspected hyperkalemic cardiac arrest.  ROSC was obtained at 20 minutes.  The patient's husband was updated as to the severity of the patient's illness.  He understands that she may not survive.  He request continued full CODE STATUS.  Critical care is aware of case.  Care transferred at the bedside at 6:50 PM.   Final Clinical Impression(s) / ED Diagnoses Final diagnoses:  Altered mental status, unspecified altered mental status type  Cardiac arrest ([place])  Hyperkalemia  AKI (acute kidney injury) [facility])    Rx / DC Orders ED  Discharge Orders     None        Valarie Merino, MD 05/05/2021 1859

## 2021-05-19 NOTE — ED Notes (Signed)
Called respiratory for pt, they will come down when finished with procedure

## 2021-05-19 NOTE — Progress Notes (Signed)
Chaplain responded to page to attend pt who is critically ill and near death and pt's family.  Chaplain found pt's family in [name] Consult Room.  Chaplain offered ministry of presence as husband cried, expressing deep sorrow his wife had taken an overdose, wondering how he was going to manage if she should die.   Husband advised his daughter and son on the way.  Chaplain checked back some time later after pt was situated and family could visit.  Chaplain escorted family to pt's bedside.  Chaplain available if needed for follow up care.  [place]

## 2021-05-19 NOTE — H&P (Addendum)
NAME:  [name], MRN:  [id number], DOB:  04-10-1956, LOS: 0 ADMISSION DATE:  04/28/2021, CONSULTATION DATE:  04/29/2021 REFERRING MD:  Francia Greaves CHIEF COMPLAINT:  Cardiac Arrest   History of Present Illness:  [name] is a 65 y.o. female who has a PMH as outlined below including but not limited to heavy EtOH abuse, prior hx of self harm by ingestion of hairspray in 2014 anxiety, DM, HTN, pancreatitis. She presented to [facility] ED 8/24 after being found unresponsive at her home with multiple medications all over the bed on EMS arrival.  She has a hx of overdose per report.  She apparently had anxiety earlier that morning from cataract surgery (unclear of date) then later found to have AMS by family.  Brought to ED where she coded for 20 minutes before ROSC.  Initial PEA but did have 2 shockable rhythms during code.  Initial VBG 6.8 / 39 / 57.  Initial iSTAT K 7.1, repeat 5.0.  SCr 5.0.  Profoundly hypotensive requiring Epinephrine and Norepinephrine infusions.  CT head pending.  EKG with A.fib and PVC's.  Pertinent  Medical History:  has Dehydration; Acute kidney injury (AKI) with acute tubular necrosis (ATN) ([place]); DM type 2 causing complication ([place]); HTN (hypertension); Alcohol abuse; Pancreatitis, acute; Hx of cancer of lung; Hyponatremia; Fracture of left humerus; Sepsis ([place]); Ketoacidosis; Ethylene glycol poisoning; Closed left hip fracture ([place]); Compression fracture of thoracic vertebra (HCC); and Lymphadenopathy, mediastinal on their problem list.  Significant Hospital Events: Including procedures, antibiotic start and stop dates in addition to other pertinent events   8/24 > admit.  Interim History / Subjective:  Sedated, not responsive.  On Epi and NE, SBP now responded to 120.  Objective:  Blood pressure (!) 120/95, pulse 89, resp. rate (!) 26, SpO2 90 %.       No intake or output data in the 24 hours ending 05/18/2021 1838 There were no vitals filed for this  visit.  Examination: General: Elderly female, critically ill. Neuro: Sedated, not responsive. HEENT: [place]/AT. Sclerae anicteric. ETT in place. Pupils 13m fixed. Cardiovascular: RRR, no M/R/G.  Lungs: Respirations even and unlabored.  CTA bilaterally, No W/R/R.  Abdomen: BS x 4, soft, NT/ND.  Musculoskeletal: No gross deformities, no edema.  Skin: Intact, warm, no rashes.  Labs/imaging personally reviewed:  CT head 8/24 >  Echo 8/24 >   Resolved Hospital Problem list:    Assessment & Plan:   Cardiac Arrest with shock, presumed cardiogenic - unclear etiology but presume initial insult overdose given hx of multiple medications found all over bed by EMS.  Initial iSTAT did show K of 7.1 however; therefore, hyperkalemia could explain her arrest. - TTM. - Continue Norepinephrine and Epinephrine infusions. - Continue bicarb infusion. - Reasonable to continue Cefepime, Vanc for now given unclear picture etc, but low threshold for early d/c if no signs of infection. - CT head. - Echo. - Trend troponins. - Send UDS, osmoles, volatile alcohol panel.  Acute hypoxic respiratory failure with inability to protect the airway - s/p intubation in ED. - Full vent support. - No weaning until has some hemodynamic improvement and stability. - Bronchial hygiene. - Follow CXR.  Hyperkalemia - s/p temporizing measures. AKI. Metabolic acidosis. - Would get BMP q2hrs x 4. - Continue bicarb infusion. - Might need emergent HD pending BMP results. - Check APAP, salicylates, volatile alcohols.  AMS - could have initially been 2/2 overdose but now complicated by arrest with concern for anoxic injury given  prolonged downtime. - Assess CT head, EEG.  EtOH abuse -strong hx per family with hx of self harm by hairspray ingestion 2014. - Thiamine, Folate. - Cessation counseling when able.  Hx HTN. - Hold home Atorvastatin, Diltiazem.  Hx DM. - SSI. 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  Hx  Anxiety, Migraines. - Hold home Duloxetine, Rizatriptan.   Best practice (evaluated daily):  Diet/type: NPO DVT prophylaxis: prophylactic heparin  GI prophylaxis: PPI Lines: Central line Foley:  Yes, and it is still needed Code Status:  full code Last date of multidisciplinary goals of care discussion: None  Labs   CBC: Recent Labs  Lab 04/28/2021 1759 05/24/2021 1831  HGB 13.9 10.5*  10.9*  HCT 41.0 31.0*  32.0*    Basic Metabolic Panel: Recent Labs  Lab 04/30/2021 1759 05/16/2021 1831  NA 127* 132*  133*  K 7.1* 5.0  5.0  CL 106 105  GLUCOSE 125* 287*  BUN 81* 76*  CREATININE 5.40* 4.80*   GFR: CrCl cannot be calculated (Unknown ideal weight.). No results for input(s): PROCALCITON, WBC, LATICACIDVEN in the last 168 hours.  Liver Function Tests: No results for input(s): AST, ALT, ALKPHOS, BILITOT, PROT, ALBUMIN in the last 168 hours. No results for input(s): LIPASE, AMYLASE in the last 168 hours. No results for input(s): AMMONIA in the last 168 hours.  ABG    Component Value Date/Time   PHART 7.302 (L) 01/17/2020 2327   PCO2ART 18.0 (LL) 01/17/2020 2327   PO2ART 86 01/17/2020 2327   HCO3 7.0 (L) 05/03/2021 1831   TCO2 11 (L) 05/05/2021 1831   TCO2 8 (L) 05/12/2021 1831   ACIDBASEDEF 26.0 (H) 04/29/2021 1831   O2SAT 64.0 05/08/2021 1831     Coagulation Profile: No results for input(s): INR, PROTIME in the last 168 hours.  Cardiac Enzymes: No results for input(s): CKTOTAL, CKMB, CKMBINDEX, TROPONINI in the last 168 hours.  HbA1C: Hgb A1c MFr Bld  Date/Time Value Ref Range Status  10/30/2020 11:14 PM 6.7 (H) 4.8 - 5.6 % Final    Comment:    (NOTE) Pre diabetes:          5.7%-6.4%  Diabetes:              >6.4%  Glycemic control for   <7.0% adults with diabetes   01/20/2020 05:40 AM 7.5 (H) 4.8 - 5.6 % Final    Comment:    (NOTE) Pre diabetes:          5.7%-6.4% Diabetes:              >6.4% Glycemic control for   <7.0% adults with diabetes      CBG: No results for input(s): GLUCAP in the last 168 hours.  Review of Systems:   Unable to obtain as pt is encephalopathic.  Past Medical History:  She,  has a past medical history of Alcohol abuse, Anxiety, Cancer ([place]), Diabetes mellitus, Headache(784.0), Hypertension, and Pancreatitis, acute.   Surgical History:   Past Surgical History:  Procedure Laterality Date   APPENDECTOMY     CESAREAN SECTION     HIP PINNING,CANNULATED Left 10/31/2020   Procedure: CANNULATED HIP PINNING;  Surgeon: Shona Needles, MD;  Location: [facility];  Service: Orthopedics;  Laterality: Left;   JOINT REPLACEMENT     rt shoulder   LUNG LOBECTOMY     ORIF HUMERUS FRACTURE  04/13/2012   Procedure: OPEN REDUCTION INTERNAL FIXATION (ORIF) PROXIMAL HUMERUS FRACTURE;  Surgeon: Augustin Schooling, MD;  Location: [facility]  OR;  Service: Orthopedics;  Laterality: Left;  open reduction internal fixation left proximal humerus fracture   SHOULDER SURGERY       Social History:   reports that she has never smoked. She has never used smokeless tobacco. She reports current alcohol use. She reports that she does not use drugs.   Family History:  Her family history includes COPD in her father; Diabetes in her father; Hypertension in her mother.   Allergies Allergies  Allergen Reactions   Ciprofloxacin Diarrhea     Home Medications  Prior to Admission medications   Medication Sig Start Date End Date Taking? Authorizing Provider  atorvastatin (LIPITOR) 10 MG tablet Take 10 mg by mouth daily.  04/18/16   [provider]  cholecalciferol (VITAMIN D) 25 MCG tablet Take 1 tablet (1,000 Units total) by mouth daily. 11/02/20   Delray Alt, PA-C  ciclopirox (PENLAC) 8 % solution Apply 1 application topically at bedtime.    [provider]  diltiazem (CARDIZEM CD) 180 MG 24 hr capsule Take 360 mg by mouth daily.    [provider]  DULoxetine (CYMBALTA) 20 MG capsule Take 20 mg by mouth daily. 10/22/20    [provider]  HYDROcodone-acetaminophen (NORCO) 7.5-325 MG tablet Take 1-2 tablets by mouth every 6 (six) hours as needed for severe pain. 11/02/20   Delray Alt, PA-C  insulin aspart (NOVOLOG FLEXPEN) 100 UNIT/ML FlexPen Inject 0-10 Units into the skin daily as needed for high blood sugar.    [provider]  Insulin Pen Needle 29G X 12MM MISC Dispense insulin pen needles 04/16/12   Laza, Sorin C, MD  ketoconazole (NIZORAL) 2 % cream Apply 1 application topically daily. 05/11/20   [provider]  naltrexone (DEPADE) 50 MG tablet Take 1 tablet by mouth daily. 10/05/20   [provider]  rizatriptan (MAXALT) 10 MG tablet Take 10 mg by mouth as needed for migraine. 10/22/20 10/23/21  [provider]  TOUJEO SOLOSTAR 300 UNIT/ML Solostar Pen Inject 24 Units into the skin daily.  11/18/19   [provider]  vitamin B-12 (CYANOCOBALAMIN) 1000 MCG tablet Take 1,000 mcg by mouth daily.    [provider]  vitamin C (ASCORBIC ACID) 500 MG tablet Take 500 mg by mouth daily.    [provider]     Critical care time: 40 min.    Montey Hora, [facility] For pager details, please see AMION or use Epic chat  After 1900, please call [facility] for cross coverage needs 05/02/2021, 6:59 PM

## 2021-05-20 ENCOUNTER — Inpatient Hospital Stay (HOSPITAL_COMMUNITY): Payer: Medicare HMO

## 2021-05-20 DIAGNOSIS — I469 Cardiac arrest, cause unspecified: Secondary | ICD-10-CM | POA: Diagnosis not present

## 2021-05-20 DIAGNOSIS — R4182 Altered mental status, unspecified: Secondary | ICD-10-CM | POA: Diagnosis not present

## 2021-05-20 LAB — TROPONIN I (HIGH SENSITIVITY)
Troponin I (High Sensitivity): 1810 ng/L (ref <18)
Troponin I (High Sensitivity): 2729 ng/L (ref <18)

## 2021-05-20 LAB — POCT I-STAT 7, (LYTES, BLD GAS, ICA,H+H)
Acid-base deficit: 5 mmol/L — ABNORMAL HIGH (ref 0.0–2.0)
Acid-base deficit: 13 mmol/L — ABNORMAL HIGH (ref 0.0–2.0)
Acid-base deficit: 12 mmol/L — ABNORMAL HIGH (ref 0.0–2.0)
Bicarbonate: 18.6 mmol/L — ABNORMAL LOW (ref 20.0–28.0)
Bicarbonate: 11.2 mmol/L — ABNORMAL LOW (ref 20.0–28.0)
Bicarbonate: 12.7 mmol/L — ABNORMAL LOW (ref 20.0–28.0)
Calcium, Ion: 1.01 mmol/L — ABNORMAL LOW (ref 1.15–1.40)
Calcium, Ion: 0.98 mmol/L — ABNORMAL LOW (ref 1.15–1.40)
Calcium, Ion: 1.08 mmol/L — ABNORMAL LOW (ref 1.15–1.40)
HCT: 29 % — ABNORMAL LOW (ref 36.0–46.0)
HCT: 35 % — ABNORMAL LOW (ref 36.0–46.0)
HCT: 28 % — ABNORMAL LOW (ref 36.0–46.0)
Hemoglobin: 9.9 g/dL — ABNORMAL LOW (ref 12.0–15.0)
Hemoglobin: 11.9 g/dL — ABNORMAL LOW (ref 12.0–15.0)
Hemoglobin: 9.5 g/dL — ABNORMAL LOW (ref 12.0–15.0)
O2 Saturation: 99 %
O2 Saturation: 95 %
O2 Saturation: 86 %
Patient temperature: 36
Patient temperature: 36.6
Patient temperature: 38
Potassium: 3.4 mmol/L — ABNORMAL LOW (ref 3.5–5.1)
Potassium: 3.3 mmol/L — ABNORMAL LOW (ref 3.5–5.1)
Potassium: 3 mmol/L — ABNORMAL LOW (ref 3.5–5.1)
Sodium: 138 mmol/L (ref 135–145)
Sodium: 137 mmol/L (ref 135–145)
Sodium: 139 mmol/L (ref 135–145)
TCO2: 19 mmol/L — ABNORMAL LOW (ref 22–32)
TCO2: 12 mmol/L — ABNORMAL LOW (ref 22–32)
TCO2: 13 mmol/L — ABNORMAL LOW (ref 22–32)
pCO2 arterial: 26.9 mmHg — ABNORMAL LOW (ref 32.0–48.0)
pCO2 arterial: 21.9 mmHg — ABNORMAL LOW (ref 32.0–48.0)
pCO2 arterial: 26.1 mmHg — ABNORMAL LOW (ref 32.0–48.0)
pH, Arterial: 7.443 (ref 7.350–7.450)
pH, Arterial: 7.316 — ABNORMAL LOW (ref 7.350–7.450)
pH, Arterial: 7.3 — ABNORMAL LOW (ref 7.350–7.450)
pO2, Arterial: 140 mmHg — ABNORMAL HIGH (ref 83.0–108.0)
pO2, Arterial: 77 mmHg — ABNORMAL LOW (ref 83.0–108.0)
pO2, Arterial: 58 mmHg — ABNORMAL LOW (ref 83.0–108.0)

## 2021-05-20 LAB — CBC
HCT: 33.1 % — ABNORMAL LOW (ref 36.0–46.0)
HCT: 26.8 % — ABNORMAL LOW (ref 36.0–46.0)
Hemoglobin: 11.5 g/dL — ABNORMAL LOW (ref 12.0–15.0)
Hemoglobin: 9.5 g/dL — ABNORMAL LOW (ref 12.0–15.0)
MCH: 32.2 pg (ref 26.0–34.0)
MCH: 32.8 pg (ref 26.0–34.0)
MCHC: 34.7 g/dL (ref 30.0–36.0)
MCHC: 35.4 g/dL (ref 30.0–36.0)
MCV: 92.7 fL (ref 80.0–100.0)
MCV: 92.4 fL (ref 80.0–100.0)
Platelets: 144 10*3/uL — ABNORMAL LOW (ref 150–400)
Platelets: 114 10*3/uL — ABNORMAL LOW (ref 150–400)
RBC: 3.57 MIL/uL — ABNORMAL LOW (ref 3.87–5.11)
RBC: 2.9 MIL/uL — ABNORMAL LOW (ref 3.87–5.11)
RDW: 13.6 % (ref 11.5–15.5)
RDW: 13.8 % (ref 11.5–15.5)
WBC: 14.5 10*3/uL — ABNORMAL HIGH (ref 4.0–10.5)
WBC: 3.8 10*3/uL — ABNORMAL LOW (ref 4.0–10.5)
nRBC: 0.2 % (ref 0.0–0.2)
nRBC: 5 % — ABNORMAL HIGH (ref 0.0–0.2)

## 2021-05-20 LAB — LACTIC ACID, PLASMA
Lactic Acid, Venous: 9.4 mmol/L (ref 0.5–1.9)
Lactic Acid, Venous: 10.8 mmol/L (ref 0.5–1.9)
Lactic Acid, Venous: 9 mmol/L (ref 0.5–1.9)
Lactic Acid, Venous: 8.3 mmol/L (ref 0.5–1.9)

## 2021-05-20 LAB — BASIC METABOLIC PANEL
Anion gap: 30 — ABNORMAL HIGH (ref 5–15)
Anion gap: 29 — ABNORMAL HIGH (ref 5–15)
Anion gap: 29 — ABNORMAL HIGH (ref 5–15)
Anion gap: 21 — ABNORMAL HIGH (ref 5–15)
BUN: 65 mg/dL — ABNORMAL HIGH (ref 8–23)
BUN: 62 mg/dL — ABNORMAL HIGH (ref 8–23)
BUN: 63 mg/dL — ABNORMAL HIGH (ref 8–23)
BUN: 62 mg/dL — ABNORMAL HIGH (ref 8–23)
CO2: 16 mmol/L — ABNORMAL LOW (ref 22–32)
CO2: 9 mmol/L — ABNORMAL LOW (ref 22–32)
CO2: 11 mmol/L — ABNORMAL LOW (ref 22–32)
CO2: 17 mmol/L — ABNORMAL LOW (ref 22–32)
Calcium: 6.7 mg/dL — ABNORMAL LOW (ref 8.9–10.3)
Calcium: 10.1 mg/dL (ref 8.9–10.3)
Calcium: 7.9 mg/dL — ABNORMAL LOW (ref 8.9–10.3)
Calcium: 7 mg/dL — ABNORMAL LOW (ref 8.9–10.3)
Chloride: 92 mmol/L — ABNORMAL LOW (ref 98–111)
Chloride: 101 mmol/L (ref 98–111)
Chloride: 99 mmol/L (ref 98–111)
Chloride: 104 mmol/L (ref 98–111)
Creatinine, Ser: 4.86 mg/dL — ABNORMAL HIGH (ref 0.44–1.00)
Creatinine, Ser: 4.2 mg/dL — ABNORMAL HIGH (ref 0.44–1.00)
Creatinine, Ser: 4.24 mg/dL — ABNORMAL HIGH (ref 0.44–1.00)
Creatinine, Ser: 3.91 mg/dL — ABNORMAL HIGH (ref 0.44–1.00)
GFR, Estimated: 9 mL/min — ABNORMAL LOW (ref >60)
GFR, Estimated: 11 mL/min — ABNORMAL LOW (ref >60)
GFR, Estimated: 11 mL/min — ABNORMAL LOW (ref >60)
GFR, Estimated: 12 mL/min — ABNORMAL LOW (ref >60)
Glucose, Bld: 369 mg/dL — ABNORMAL HIGH (ref 70–99)
Glucose, Bld: 408 mg/dL — ABNORMAL HIGH (ref 70–99)
Glucose, Bld: 416 mg/dL — ABNORMAL HIGH (ref 70–99)
Glucose, Bld: 356 mg/dL — ABNORMAL HIGH (ref 70–99)
Potassium: 3.8 mmol/L (ref 3.5–5.1)
Potassium: 3.1 mmol/L — ABNORMAL LOW (ref 3.5–5.1)
Potassium: 2.9 mmol/L — ABNORMAL LOW (ref 3.5–5.1)
Potassium: 2.6 mmol/L — CL (ref 3.5–5.1)
Sodium: 138 mmol/L (ref 135–145)
Sodium: 139 mmol/L (ref 135–145)
Sodium: 139 mmol/L (ref 135–145)
Sodium: 142 mmol/L (ref 135–145)

## 2021-05-20 LAB — GLUCOSE, CAPILLARY
Glucose-Capillary: 195 mg/dL — ABNORMAL HIGH (ref 70–99)
Glucose-Capillary: 288 mg/dL — ABNORMAL HIGH (ref 70–99)
Glucose-Capillary: 327 mg/dL — ABNORMAL HIGH (ref 70–99)
Glucose-Capillary: 335 mg/dL — ABNORMAL HIGH (ref 70–99)
Glucose-Capillary: 346 mg/dL — ABNORMAL HIGH (ref 70–99)
Glucose-Capillary: 346 mg/dL — ABNORMAL HIGH (ref 70–99)
Glucose-Capillary: 370 mg/dL — ABNORMAL HIGH (ref 70–99)
Glucose-Capillary: 374 mg/dL — ABNORMAL HIGH (ref 70–99)
Glucose-Capillary: 398 mg/dL — ABNORMAL HIGH (ref 70–99)
Glucose-Capillary: 405 mg/dL — ABNORMAL HIGH (ref 70–99)
Glucose-Capillary: 412 mg/dL — ABNORMAL HIGH (ref 70–99)

## 2021-05-20 LAB — MRSA NEXT GEN BY PCR, NASAL: MRSA by PCR Next Gen: NOT DETECTED

## 2021-05-20 LAB — PHOSPHORUS: Phosphorus: 6 mg/dL — ABNORMAL HIGH (ref 2.5–4.6)

## 2021-05-20 LAB — HEPATIC FUNCTION PANEL
ALT: 110 U/L — ABNORMAL HIGH (ref 0–44)
AST: 782 U/L — ABNORMAL HIGH (ref 15–41)
Albumin: 1.8 g/dL — ABNORMAL LOW (ref 3.5–5.0)
Alkaline Phosphatase: 81 U/L (ref 38–126)
Bilirubin, Direct: 1.5 mg/dL — ABNORMAL HIGH (ref 0.0–0.2)
Indirect Bilirubin: 0.8 mg/dL (ref 0.3–0.9)
Total Bilirubin: 2.3 mg/dL — ABNORMAL HIGH (ref 0.3–1.2)
Total Protein: <3 g/dL — ABNORMAL LOW (ref 6.5–8.1)

## 2021-05-20 LAB — ETHYLENE GLYCOL: Ethylene Glycol Lvl: <5 mg/dL

## 2021-05-20 LAB — PROTIME-INR
INR: 2.7 — ABNORMAL HIGH (ref 0.8–1.2)
Prothrombin Time: 29 seconds — ABNORMAL HIGH (ref 11.4–15.2)

## 2021-05-20 LAB — TRIGLYCERIDES: Triglycerides: 1312 mg/dL — ABNORMAL HIGH (ref <150)

## 2021-05-20 LAB — VOLATILES,BLD-ACETONE,ETHANOL,ISOPROP,METHANOL
Acetone, blood: <0.01 g/dL (ref 0.000–0.010)
Ethanol, blood: 0.04 g/dL — ABNORMAL HIGH (ref 0.000–0.010)
Isopropanol, blood: <0.01 g/dL (ref 0.000–0.010)
Methanol, blood: <0.01 g/dL (ref 0.000–0.010)

## 2021-05-20 LAB — ECHOCARDIOGRAM LIMITED
Height: 62 in
Weight: 2529.12 oz

## 2021-05-20 LAB — MAGNESIUM: Magnesium: 2.5 mg/dL — ABNORMAL HIGH (ref 1.7–2.4)

## 2021-05-20 LAB — CK: Total CK: 2999 U/L — ABNORMAL HIGH (ref 38–234)

## 2021-05-20 MED ORDER — GLYCOPYRROLATE 0.2 MG/ML IJ SOLN: 0.2000 mg | INTRAMUSCULAR | Status: DC | PRN

## 2021-05-20 MED ORDER — GLYCOPYRROLATE 0.2 MG/ML IJ SOLN
0.2000 mg | INTRAMUSCULAR | Status: DC | PRN
  Administered 2021-05-20: 0.2 mg via INTRAVENOUS
  Filled 2021-05-20: qty 1

## 2021-05-20 MED ORDER — CALCIUM GLUCONATE-NACL 2-0.675 GM/100ML-% IV SOLN
2.0000 g | Freq: Once | INTRAVENOUS | Status: AC
  Administered 2021-05-20: 2000 mg via INTRAVENOUS
  Filled 2021-05-20: qty 100

## 2021-05-20 MED ORDER — LACTATED RINGERS IV BOLUS
1000.0000 mL | Freq: Once | INTRAVENOUS | Status: AC
  Administered 2021-05-20: 1000 mL via INTRAVENOUS

## 2021-05-20 MED ORDER — PHENYLEPHRINE CONCENTRATED 100MG/250ML (0.4 MG/ML) INFUSION SIMPLE
0.0000 ug/min | INTRAVENOUS | Status: DC
  Administered 2021-05-20: 400 ug/min via INTRAVENOUS
  Administered 2021-05-20: 200 ug/min via INTRAVENOUS
  Administered 2021-05-20: 400 ug/min via INTRAVENOUS
  Filled 2021-05-20 (×5): qty 250

## 2021-05-20 MED ORDER — NOREPINEPHRINE 16 MG/250ML-% IV SOLN
0.0000 ug/min | INTRAVENOUS | Status: DC
  Administered 2021-05-20: 80 ug/min via INTRAVENOUS
  Administered 2021-05-20: 30 ug/min via INTRAVENOUS
  Administered 2021-05-20: 80 ug/min via INTRAVENOUS
  Filled 2021-05-20: qty 250
  Filled 2021-05-20: qty 500

## 2021-05-20 MED ORDER — ALBUMIN HUMAN 5 % IV SOLN
25.0000 g | Freq: Once | INTRAVENOUS | Status: AC
  Administered 2021-05-20: 25 g via INTRAVENOUS
  Filled 2021-05-20: qty 500

## 2021-05-20 MED ORDER — SODIUM BICARBONATE 8.4 % IV SOLN
50.0000 meq | Freq: Once | INTRAVENOUS | Status: AC
  Administered 2021-05-20: 50 meq via INTRAVENOUS

## 2021-05-20 MED ORDER — ASPIRIN 81 MG PO CHEW: 81.0000 mg | CHEWABLE_TABLET | Freq: Every day | ORAL | Status: DC

## 2021-05-20 MED ORDER — ASPIRIN 325 MG PO TABS: 325.0000 mg | ORAL_TABLET | Freq: Once | ORAL | Status: DC

## 2021-05-20 MED ORDER — PANTOPRAZOLE SODIUM 40 MG IV SOLR
40.0000 mg | Freq: Two times a day (BID) | INTRAVENOUS | Status: DC
  Administered 2021-05-20: 40 mg via INTRAVENOUS
  Filled 2021-05-20: qty 40

## 2021-05-20 MED ORDER — CHLORHEXIDINE GLUCONATE 0.12% ORAL RINSE (MEDLINE KIT)
15.0000 mL | Freq: Two times a day (BID) | OROMUCOSAL | Status: DC
  Administered 2021-05-20: 15 mL via OROMUCOSAL

## 2021-05-20 MED ORDER — MIDAZOLAM HCL 2 MG/2ML IJ SOLN
1.0000 mg | INTRAMUSCULAR | Status: DC | PRN
  Administered 2021-05-20: 1 mg via INTRAVENOUS
  Administered 2021-05-20: 2 mg via INTRAVENOUS
  Filled 2021-05-20 (×2): qty 2

## 2021-05-20 MED ORDER — PHENYLEPHRINE 40 MCG/ML (10ML) SYRINGE FOR IV PUSH (FOR BLOOD PRESSURE SUPPORT)
PREFILLED_SYRINGE | INTRAVENOUS | Status: AC
  Filled 2021-05-20: qty 10

## 2021-05-20 MED ORDER — DEXTROSE 50 % IV SOLN: 0.0000 mL | INTRAVENOUS | Status: DC | PRN

## 2021-05-20 MED ORDER — POLYVINYL ALCOHOL 1.4 % OP SOLN
1.0000 [drp] | Freq: Four times a day (QID) | OPHTHALMIC | Status: DC | PRN
  Filled 2021-05-20: qty 15

## 2021-05-20 MED ORDER — SODIUM BICARBONATE 8.4 % IV SOLN
150.0000 meq | Freq: Once | INTRAVENOUS | Status: AC
  Administered 2021-05-20: 150 meq via INTRAVENOUS
  Filled 2021-05-20: qty 50

## 2021-05-20 MED ORDER — MIDAZOLAM-SODIUM CHLORIDE 100-0.9 MG/100ML-% IV SOLN
0.0000 mg/h | INTRAVENOUS | Status: DC
  Administered 2021-05-20: 4 mg/h via INTRAVENOUS

## 2021-05-20 MED ORDER — CALCIUM CHLORIDE 10 % IV SOLN: 1.0000 g | Freq: Once | INTRAVENOUS | Status: AC

## 2021-05-20 MED ORDER — ORAL CARE MOUTH RINSE
15.0000 mL | OROMUCOSAL | Status: DC
  Administered 2021-05-20 (×2): 15 mL via OROMUCOSAL

## 2021-05-20 MED ORDER — SODIUM CHLORIDE 0.9 % IV BOLUS
1000.0000 mL | Freq: Once | INTRAVENOUS | Status: AC
  Administered 2021-05-20: 1000 mL via INTRAVENOUS

## 2021-05-20 MED ORDER — SODIUM BICARBONATE 8.4 % IV SOLN
100.0000 meq | Freq: Once | INTRAVENOUS | Status: AC
  Administered 2021-05-20: 100 meq via INTRAVENOUS

## 2021-05-20 MED ORDER — ALBUTEROL SULFATE (2.5 MG/3ML) 0.083% IN NEBU: 2.5000 mg | INHALATION_SOLUTION | RESPIRATORY_TRACT | Status: DC | PRN

## 2021-05-20 MED ORDER — DEXTROSE 5 % IV SOLN: INTRAVENOUS | Status: DC

## 2021-05-20 MED ORDER — CALCIUM CHLORIDE 10 % IV SOLN
1.0000 g | Freq: Once | INTRAVENOUS | Status: AC
  Administered 2021-05-20: 1 g via INTRAVENOUS

## 2021-05-20 MED ORDER — MORPHINE SULFATE (PF) 2 MG/ML IV SOLN: 2.0000 mg | INTRAVENOUS | Status: DC | PRN

## 2021-05-20 MED ORDER — HALOPERIDOL LACTATE 5 MG/ML IJ SOLN
2.5000 mg | INTRAMUSCULAR | Status: DC | PRN
  Administered 2021-05-20: 5 mg via INTRAVENOUS
  Filled 2021-05-20: qty 1

## 2021-05-20 MED ORDER — VASOPRESSIN 20 UNITS/100 ML INFUSION FOR SHOCK
0.0000 [IU]/min | INTRAVENOUS | Status: DC
  Administered 2021-05-20 (×2): 0.03 [IU]/min via INTRAVENOUS
  Filled 2021-05-20 (×2): qty 100

## 2021-05-20 MED ORDER — MIDAZOLAM HCL 2 MG/2ML IJ SOLN: 1.0000 mg | INTRAMUSCULAR | Status: DC | PRN

## 2021-05-20 MED ORDER — SODIUM CHLORIDE 0.9 % IV SOLN
0.5000 ug/min | INTRAVENOUS | Status: DC
  Administered 2021-05-20: 20 ug/min via INTRAVENOUS
  Administered 2021-05-20: 10 ug/min via INTRAVENOUS
  Filled 2021-05-20 (×3): qty 10

## 2021-05-20 MED ORDER — MORPHINE 100MG IN NS 100ML (1MG/ML) PREMIX INFUSION
0.0000 mg/h | INTRAVENOUS | Status: DC
  Administered 2021-05-20: 10 mg/h via INTRAVENOUS
  Filled 2021-05-20: qty 100

## 2021-05-20 MED ORDER — CHLORHEXIDINE GLUCONATE CLOTH 2 % EX PADS
6.0000 | MEDICATED_PAD | Freq: Every day | CUTANEOUS | Status: DC
  Administered 2021-05-20: 6 via TOPICAL

## 2021-05-20 MED ORDER — MIDAZOLAM BOLUS VIA INFUSION
0.0000 mg | INTRAVENOUS | Status: DC | PRN
  Filled 2021-05-20: qty 5

## 2021-05-20 MED ORDER — CALCIUM CHLORIDE 10 % IV SOLN
INTRAVENOUS | Status: AC
  Filled 2021-05-20: qty 10

## 2021-05-20 MED ORDER — ACETAMINOPHEN 325 MG PO TABS: 650.0000 mg | ORAL_TABLET | Freq: Four times a day (QID) | ORAL | Status: DC | PRN

## 2021-05-20 MED ORDER — SODIUM BICARBONATE 8.4 % IV SOLN
100.0000 meq | Freq: Once | INTRAVENOUS | Status: AC
  Administered 2021-05-20: 100 meq via INTRAVENOUS
  Filled 2021-05-20: qty 100

## 2021-05-20 MED ORDER — PHENYLEPHRINE HCL-NACL 20-0.9 MG/250ML-% IV SOLN
0.0000 ug/min | INTRAVENOUS | Status: DC
  Administered 2021-05-20: 20 ug/min via INTRAVENOUS
  Filled 2021-05-20: qty 250

## 2021-05-20 MED ORDER — MIDAZOLAM-SODIUM CHLORIDE 100-0.9 MG/100ML-% IV SOLN
0.0000 mg/h | INTRAVENOUS | Status: DC
  Administered 2021-05-20: 2 mg/h via INTRAVENOUS
  Filled 2021-05-20: qty 100

## 2021-05-20 MED ORDER — MIDAZOLAM BOLUS VIA INFUSION (WITHDRAWAL LIFE SUSTAINING TX)
2.0000 mg | INTRAVENOUS | Status: DC | PRN
  Filled 2021-05-20: qty 2

## 2021-05-20 MED ORDER — GLYCOPYRROLATE 1 MG PO TABS
1.0000 mg | ORAL_TABLET | ORAL | Status: DC | PRN
  Filled 2021-05-20: qty 1

## 2021-05-20 MED ORDER — INSULIN ASPART 100 UNIT/ML IJ SOLN: 1.0000 [IU] | INTRAMUSCULAR | Status: DC

## 2021-05-20 MED ORDER — ALBUMIN HUMAN 5 % IV SOLN
25.0000 g | Freq: Once | INTRAVENOUS | Status: AC
  Filled 2021-05-20: qty 500

## 2021-05-20 MED ORDER — ALBUMIN HUMAN 5 % IV SOLN
INTRAVENOUS | Status: AC
  Administered 2021-05-20: 25 g via INTRAVENOUS
  Filled 2021-05-20: qty 250

## 2021-05-20 MED ORDER — POTASSIUM CHLORIDE 10 MEQ/100ML IV SOLN
10.0000 meq | INTRAVENOUS | Status: AC
  Administered 2021-05-20 (×5): 10 meq via INTRAVENOUS
  Filled 2021-05-20 (×6): qty 100

## 2021-05-20 MED ORDER — DIPHENHYDRAMINE HCL 50 MG/ML IJ SOLN: 25.0000 mg | INTRAMUSCULAR | Status: DC | PRN

## 2021-05-20 MED ORDER — MORPHINE BOLUS VIA INFUSION
5.0000 mg | INTRAVENOUS | Status: DC | PRN
  Filled 2021-05-20: qty 5

## 2021-05-20 MED ORDER — CALCIUM GLUCONATE-NACL 2-0.675 GM/100ML-% IV SOLN
2.0000 g | Freq: Once | INTRAVENOUS | Status: DC
  Filled 2021-05-20: qty 100

## 2021-05-20 MED ORDER — ACETAMINOPHEN 650 MG RE SUPP: 650.0000 mg | Freq: Four times a day (QID) | RECTAL | Status: DC | PRN

## 2021-05-20 MED ORDER — INSULIN REGULAR(HUMAN) IN NACL 100-0.9 UT/100ML-% IV SOLN
INTRAVENOUS | Status: DC
  Administered 2021-05-20: 9 [IU]/h via INTRAVENOUS
  Filled 2021-05-20: qty 100

## 2021-05-20 MED ORDER — HYDROCORTISONE NA SUCCINATE PF 100 MG IJ SOLR
50.0000 mg | Freq: Three times a day (TID) | INTRAMUSCULAR | Status: DC
  Administered 2021-05-20: 50 mg via INTRAVENOUS
  Filled 2021-05-20: qty 2

## 2021-05-20 MED ORDER — CALCIUM CHLORIDE 10 % IV SOLN
INTRAVENOUS | Status: AC
  Administered 2021-05-20: 1 g via INTRAVENOUS
  Filled 2021-05-20: qty 10

## 2021-05-21 LAB — PATHOLOGIST SMEAR REVIEW

## 2021-05-27 NOTE — Progress Notes (Signed)
Wasted 139m of fentanyl 985mof morphine 5085mf versed with [employer]

## 2021-05-27 NOTE — Progress Notes (Signed)
eLink Physician-Brief Progress Note Patient Name: MELISAA VANDENAKKER DOB: 17-Oct-1955 MRN: [id number]   Date of Service  05/21/21  HPI/Events of Note  Patient admitted via ED with out of hospital cardiac arrest secondary to suspected poly-medication overdose, she is comatose, intubated, and mechanically ventilated. She is in profound shock with severe metabolic acidosis, and hypocalcemia.  eICU Interventions  New Patient Evaluation. Ceiling on Norepinephrine and Epinephrine increased and Phenylephrine gtt added,  Sodium Bicarbonate 100 meq iv push, Bicarb gtt increased to 150 ml / hour, Calcium chloride 1 gm iv push, arterial line ordered stat, Normal Saline 1000 ml iv bolus ordered, Blood pressure now up to 104/48 with MAP of 64 mmHg. Follow up labs sent, including Lactic acid, CBC, electrolytes, ABG.        [name] Kemaya Dorner 05-21-21, 12:36 AM

## 2021-05-27 NOTE — Procedures (Addendum)
Arterial Catheter Insertion Procedure Note  [name]  [id number]  [birth year]  Date:06-13-2021  Time:1:23 AM    Provider Performing: Leigh Aurora, BS, RRT-ACCS, RCP   Procedure: Insertion of Arterial Line [phone]) without US guidance  Indication(s) Blood pressure monitoring and/or need for frequent ABGs  Consent Unable to obtain consent due to emergent nature of procedure.  Anesthesia None   Time Out Verified patient identification, verified procedure, site/side was marked, verified correct patient position, special equipment/implants available, medications/allergies/relevant history reviewed, required imaging and test results available.   Sterile Technique Maximal sterile technique including full sterile barrier drape, hand hygiene, sterile gown, sterile gloves, mask, hair covering, sterile ultrasound probe cover (if used).   Procedure Description Area of catheter insertion was cleaned with chlorhexidine and draped in sterile fashion. Without real-time ultrasound guidance an arterial catheter was placed into the right radial artery.  Appropriate arterial tracings confirmed on monitor.     Complications/Tolerance None; patient tolerated the procedure well.    Golden Gilreath L. Tamala Julian, BS, RRT-ACCS, RCP

## 2021-05-27 NOTE — Progress Notes (Addendum)
CCM Interval Progress Note  65yo F admitted 8/24 post arrest in setting of suspected substance ingestion vs OD-- 30mn ACLS, intubated, on levophed with AKI and hyperkalemia. 8/25 has had worsening shock (now on max dose 4 pressors and stress dose steroids), worsening hypoxia now ARDS, and refractory metabolic acidosis despite temporizing efforts. Code status updated to DNR 8/25 in conversation with family.   In review of labs/tests  I ordered this morning, pt has worsening coagulopathy and liver function, worse thrombocytopenia. CK is elevated. LA and AG remain elevated. ECHO did not have evidence of pericardial effusion, LV hyperdynamic.   She remains hypoxic -- despite PEEP 10 FiO2 100% SpO2s are 80s Remains in shock -- on 80 NE 400 neo 20 epi 0.03 vaso stress dose steroids and MAPs are 50-55 via arterial line   Catholic PIdelle Crouchwas able to visit at the bedside and facilitate last rites    On exam Gen: Critically ill appearing intubated sedated  HEENT: NCAT pink mm ETT secure CV: tachycardic, regular. S1s2  Pulm: symmetrical chest expansion, mechanically ventilated, increased rate GI: soft distended hypoactive, increased dark OGT output GU: foley with scant urine output MSK: no acute joint deformity. No edema.  Skin: pale, cyanotic. Sluggish cap refill    A&P  Cardiac arrest with ROSC (276m downtime. Etiology unclear, suspected ingested substance related but substance unknown) Shock, refractory (ECHO without tamponade, LV hyperdynamic) Acute respiratory failure with hypoxia, ARDS  Acute liver injury (AST 782 ALT 110) Coagulopathy (INR 2.7) Hyperbilirubinemia (2.3) AKI (Cr 3.9 GFR 12) Elevated CK (2999) Elevated Troponin (2729) AGMA, severe (AG 21)  Lactic acidosis (8.3) Hypokalemia (2.6) Hypocalcemia (iCal 0.98) Protein calorie malnutrition (albumin 1.8 protein <3) Thrombocytopenia (114) DM with hyperglycemia (374) DNR status  Encounter for palliative care / goals of  care I discussed the patient's ongoing deterioration and new evidence of worsening multisystem organ failure with pts husband, daughter and son.  I expressed my concern that the patient is dying, and recommended transition to comfort care.  Family is in agreement with this recommendation P -transition to comfort care -adding a versed gtt and morphine gtt in addition to fent gtt (currently fent at 300 with ongoing tachypnea, is not opiate naive -- think likely will require multiple agents for optimized EOL comfort)  -dc all measures not aimed at comfort including labs, tests, meds -when family is ready, compassionate liberation from mechanical ventilation     Additional critical care time: 42 minutes   GrEliseo GumSN, AGACNP-BC [place] pager  8/09-03-222:06 PM

## 2021-05-27 NOTE — Progress Notes (Addendum)
eLink Physician-Brief Progress Note Patient Name: [name] DOB: Jul 22, 1956 MRN: [id number]   Date of Service  06/19/2021  HPI/Events of Note  Elevated Troponin and inferior ST segment elevation on 12 lead EKG, r/o acute coronary syndrome. Patient is hyperglycemic.  eICU Interventions  Aspirin ordered, patient not really stable enough for other standard ACS treatments, echocardiogram ordered, will consult cardiology. CBG with SSI coverage ordered. ADDENDUM. We paged Cardiology x 3 without response so I called the on call cardiologist's cell phone (Dr. Kalman Shan) and left a message regarding the cardiology consult.        [name] [name] June 19, 2021, 5:28 AM

## 2021-05-27 NOTE — Progress Notes (Addendum)
NAME:  [name], MRN:  [id number], DOB:  11/07/1955, LOS: 1 ADMISSION DATE:  05/15/2021, CONSULTATION DATE:  05/03/2021 REFERRING MD:  Francia Greaves CHIEF COMPLAINT:  Cardiac Arrest   History of Present Illness:  [name] is a 65 y.o. female who has a PMH as outlined below including but not limited to heavy EtOH abuse, prior hx of self harm by ingestion of hairspray in 2014 anxiety, DM, HTN, pancreatitis. She presented to [facility] ED 8/24 after being found unresponsive at her home with multiple medications all over the bed on EMS arrival.  She has a hx of overdose per report.  She apparently had anxiety earlier that morning from cataract surgery (unclear of date) then later found to have AMS by family.  Brought to ED where she coded for 20 minutes before ROSC.  Initial PEA but did have 2 shockable rhythms during code.  Initial VBG 6.8 / 39 / 57.  Initial iSTAT K 7.1, repeat 5.0.  SCr 5.0.  Profoundly hypotensive requiring Epinephrine and Norepinephrine infusions.  CT head pending.  EKG with A.fib and PVC's.  Pertinent  Medical History:  has Dehydration; Acute kidney injury (AKI) with acute tubular necrosis (ATN) ([place]); DM type 2 causing complication ([place]); HTN (hypertension); Alcohol abuse; Pancreatitis, acute; Hx of cancer of lung; Hyponatremia; Fracture of left humerus; Sepsis ([place]); High anion gap metabolic acidosis; Ethylene glycol poisoning; Closed left hip fracture ([place]); Compression fracture of thoracic vertebra ([place]); Lymphadenopathy, mediastinal; AKI (acute kidney injury) ([place]); Altered mental status; Cardiac arrest ([place]); Hyperkalemia; Respiratory failure ([place]); Endotracheally intubated; and Toxic metabolic encephalopathy on their problem list.  Significant Hospital Events: Including procedures, antibiotic start and stop dates in addition to other pertinent events   8/24 > admit. 8/25 profoundly worsening shock. Acidosis is temporized but this is on a bicarb gtt and RR high 30s    Interim History / Subjective:  On NE Neo Epi Vaso Bicarb gtt  pH improved, PaO2 50s  Vent incr to FiO2 100% PEEP 10   Fent gtt shut off   UOP is poor, Cr slightly improved    Objective:  Blood pressure 104/75, pulse 99, temperature 98.2 F (36.8 C), resp. rate (!) 29, height '5\' 2"'$  (1.575 m), weight 71.7 kg, SpO2 (!) 86 %. CVP:  [3 mmHg-7 mmHg] 7 mmHg  Vent Mode: PRVC FiO2 (%):  [60 %-100 %] 100 % Set Rate:  [18 bmp-20 bmp] 20 bmp Vt Set:  [450 mL-550 mL] 550 mL PEEP:  [5 cmH20-10 cmH20] 10 cmH20 Plateau Pressure:  [19 cmH20] 19 cmH20   Intake/Output Summary (Last 24 hours) at 06-07-21 1139 Last data filed at 06/07/2021 1100 Gross per 24 hour  Intake 11054.82 ml  Output 705 ml  Net 10349.82 ml   Filed Weights   2021/06/07 0400  Weight: 71.7 kg    Examination: General: Critically ill adult F intubated sedated  Neuro: Sedated, does follow some commands. PERRL 64m. Some extremity twitching  HEENT: NCAT ETT secure anicteric sclera  Cardiovascular: tachycardic, s1s2 cap refill sluggish  Lungs:  Mechanically ventilated. Overbreathing vent. Some rhonchi  Abdomen: Distended, soft. Hypoactive. Dark brown thin OGT output  Musculoskeletal: No acute joint deformity. No edema.  Skin: Cool, clean, dry, some extremity mottling   Labs/imaging personally reviewed:  CT head 8/24 > no acute deformity Echo 8/24 >   Resolved Hospital Problem list:    Assessment & Plan:   Acute metabolic encephalopathy -follows some commands, encephalopathy suspect due to acidosis, hypoperfusion, ?underlying ingested substance  Twitching -- think this is from acidosis. Does not seem c/w seizure  P -to provide most aggressive care, minimize sedation as able.  -I do however anticipate that the patient will further decline and I do think consideration of transitioning to comfort measures is appropriate in which case I would liberalize medications   Acute hypoxic respiratory failure ARDS due to  aspiration vs aspiration pneumonitis  P -Incr FiO2 100& PEEP 10 -VAP, PAD, pulm hygiene -empiric abx for aspiration  -letting her overbreathe vent to help compensate for metabolic acidosis  Cardiac arrest with ROSC Shock -- worse Elevated troponin  P -incr NE ceiling to 80. NE neo Epi vaso are max dose  -2L LR -stress dose steroids  -Follow ECHO read   Severe AGMA  Lactic Acidosis P -add on ethylene glycol -cont bicarb gtt, additional amp now   AKI  P -add on CK  -2L IVF as above   Coagulopathy P -check LFTs  -repeat Coags --high probability this is worse which would reflect worsening multisystem organ failure   Hematemesis P -BID PPI -OGT LIMWS  -follow coags above   Hyperkalemia // Hypokalemia  Hypocalcemia  P -replace   Hyperglycemia ? Component of DKA Hx DM P -Insulin gtt -check beta hydroxyb -2L LR as above   GOC// Encounter for palliative care // DNR status -I spoke with the husband at bedside, daughter over the phone 8/25 and discussed clinical decompensation and tenuous critical illness -outlined vent support, pressor support, multisystem organ failure, severe acidosis and concerns that this may not be survivable -I recommend DNR code status as I do not believe that ACLS measures will provide great utility in event of arrest. Husband and daughter agree with recommendation. RN Misha present throughout -Later discussed with husband/daughter with Dr. Lynetta Mare again P -DNR -recommend consideration of transition to comfort care -- will revisit when family at bedside    Best practice (evaluated daily):  Diet/type: NPO DVT prophylaxis: prophylactic heparin  GI prophylaxis: PPI Lines: Central line Foley:  Yes, and it is still needed Code Status:  DNR Last date of multidisciplinary goals of care discussion: 8/25 -- PCCM RN Pharm with husband and daughter   Labs   CBC: Recent Labs  Lab 05/26/2021 1856 05/07/2021 1913 05/22/2021 2303 2021/06/17 0115  06/17/21 0136 17-Jun-2021 0337 06/17/21 0951  WBC 25.8*  --   --   --  14.5*  --  3.8*  NEUTROABS 22.8*  --   --   --   --   --   --   HGB 11.8*   < > 11.9* 9.9* 11.5* 11.9* 9.5*  HCT 37.0   < > 35.0* 29.0* 33.1* 35.0* 26.8*  MCV 102.8*  --   --   --  92.7  --  92.4  PLT 184  --   --   --  144*  --  PENDING   < > = values in this interval not displayed.    Basic Metabolic Panel: Recent Labs  Lab 04/27/2021 2120 05/22/2021 2303 2021/06/17 0046 17-Jun-2021 0115 06-17-21 0337 06-17-2021 0410 Jun 17, 2021 0709 06-17-21 0951  NA 134*   < > 138 138 137 139 139 142  K 5.2*   < > 3.8 3.4* 3.3* 3.1* 2.9* 2.6*  CL 97*  --  92*  --   --  101 99 104  CO2 <7*  --  16*  --   --  9* 11* 17*  GLUCOSE 187*  --  369*  --   --  408* 416* 356*  BUN 63*  --  65*  --   --  62* 63* 62*  CREATININE 4.79*  --  4.86*  --   --  4.20* 4.24* 3.91*  CALCIUM 8.0*  --  6.7*  --   --  10.1 7.9* 7.0*  MG  --   --   --   --   --  2.5*  --   --   PHOS  --   --   --   --   --  6.0*  --   --    < > = values in this interval not displayed.   GFR: Estimated Creatinine Clearance: 13.1 mL/min (A) (by C-G formula based on SCr of 3.91 mg/dL (H)). Recent Labs  Lab 05/06/2021 1856 05/16/2021 2031 2021-06-04 0136 2021/06/04 0410 Jun 04, 2021 0709 06/04/21 0951  WBC 25.8*  --  14.5*  --   --  3.8*  LATICACIDVEN >11.0* 9.4*  --  10.8* 9.0* 8.3*    Liver Function Tests: Recent Labs  Lab 05/04/2021 1856  AST 128*  ALT 50*  ALKPHOS 132*  BILITOT 1.0  PROT 4.4*  ALBUMIN 2.1*   No results for input(s): LIPASE, AMYLASE in the last 168 hours. No results for input(s): AMMONIA in the last 168 hours.  ABG    Component Value Date/Time   PHART 7.316 (L) 2021-06-04 0337   PCO2ART 21.9 (L) 06/04/2021 0337   PO2ART 77 (L) 2021-06-04 0337   HCO3 11.2 (L) 06-04-2021 0337   TCO2 12 (L) 2021/06/04 0337   ACIDBASEDEF 13.0 (H) 04-Jun-2021 0337   O2SAT 95.0 06-04-21 0337     Coagulation Profile: Recent Labs  Lab 05/23/2021 1856  06-04-21 0951  INR 1.7* 2.7*    Cardiac Enzymes: No results for input(s): CKTOTAL, CKMB, CKMBINDEX, TROPONINI in the last 168 hours.  HbA1C: Hgb A1c MFr Bld  Date/Time Value Ref Range Status  10/30/2020 11:14 PM 6.7 (H) 4.8 - 5.6 % Final    Comment:    (NOTE) Pre diabetes:          5.7%-6.4%  Diabetes:              >6.4%  Glycemic control for   <7.0% adults with diabetes   01/20/2020 05:40 AM 7.5 (H) 4.8 - 5.6 % Final    Comment:    (NOTE) Pre diabetes:          5.7%-6.4% Diabetes:              >6.4% Glycemic control for   <7.0% adults with diabetes     CBG: Recent Labs  Lab 04-Jun-2021 0734 06/04/2021 0818 2021-06-04 0906 2021-06-04 0955 June 04, 2021 1054  GLUCAP 374* 398* 346* 346* 195*    CRITICAL CARE Performed by: Cristal Generous   Total critical care time: 70 minutes  Critical care time was exclusive of separately billable procedures and treating other patients.  Critical care was necessary to treat or prevent imminent or life-threatening deterioration.  Critical care was time spent personally by me on the following activities: development of treatment plan with patient and/or surrogate as well as nursing, discussions with consultants, evaluation of patient's response to treatment, examination of patient, obtaining history from patient or surrogate, ordering and performing treatments and interventions, ordering and review of laboratory studies, ordering and review of radiographic studies, pulse oximetry and re-evaluation of patient's condition.  Eliseo Gum MSN, AGACNP-BC [place] for pager  Jun 04, 2021, 11:39 AM

## 2021-05-27 NOTE — Progress Notes (Signed)
   Jun 05, 2021 1100  Clinical Encounter Type  Visited With Patient and family together  Visit Type Follow-up;Spiritual support  Referral From Nurse  Consult/Referral To Chaplain  Spiritual Encounters  Spiritual Needs Ritual;Emotional;Sacred text;Prayer   Chaplain met Father Tom at the main entrance. Chaplain escorted Father Gershon Mussel to Pt's room. Chaplain was present while Pt received last rites. Chaplain let Pt's husband know to reach out if they need anything else and escorted Father Gershon Mussel out of the hospital. Bonney Roussel remains available.  This note was prepared by Chaplain Resident, Dante Gang, MDiv. Chaplain remains available as needed through the on-call pager: [phone].

## 2021-05-27 NOTE — Progress Notes (Addendum)
   05-26-2021 [id number]  Clinical Encounter Type  Visited With Patient and family together  Visit Type Initial;Spiritual support;Critical Care  Referral From Nurse  Consult/Referral To Chaplain  Spiritual Encounters  Spiritual Needs Other (Comment);Emotional [name])   Chaplain responded to page for spiritual support. Pt's husband, Legrand Como, was at bedside. Chaplain engaged active listening and provided emotional support. Pt's husband requested a Catholic priest for last rites. Chaplain reached out to Pt's parish, [place] in [place], first and if they are unable to come shortly, will reach out to other parishes in the area. Chaplain remains available.  Powell updated Misha, RN, that Father Clint Lipps will arrive at 11am for last rites.   This note was prepared by Chaplain Resident, Dante Gang, MDiv. Chaplain remains available as needed through the on-call pager: [phone].

## 2021-05-27 NOTE — Procedures (Addendum)
Central Venous Catheter Insertion Procedure Note  AYRIN PIZZIMENTI  [id number]  [birth year]  Date:May 29, 2021  Time:12:32 PM   Provider Performing:Brandilynn Taormina   Procedure: Insertion of Non-tunneled Central Venous Catheter(36556) with US guidance [id number])   Indication(s) Medication administration and Difficult access  Consent Unable to obtain consent due to emergent nature of procedure.  Anesthesia Topical only with 1% lidocaine   Timeout Verified patient identification, verified procedure, site/side was marked, verified correct patient position, special equipment/implants available, medications/allergies/relevant history reviewed, required imaging and test results available.  Sterile Technique Maximal sterile technique including full sterile barrier drape, hand hygiene, sterile gown, sterile gloves, mask, hair covering, sterile ultrasound probe cover (if used).  Procedure Description Area of catheter insertion was cleaned with chlorhexidine and draped in sterile fashion.  With real-time ultrasound guidance a central venous catheter was placed into the left subclavian vein. Nonpulsatile blood flow and easy flushing noted in all ports.  The catheter was sutured in place and sterile dressing applied.  Complications/Tolerance None; patient tolerated the procedure well. Chest X-ray is ordered to verify placement for internal jugular or subclavian cannulation.   Chest x-ray is not ordered for femoral cannulation.  EBL Minimal  Specimen(s) None  Kipp Brood, MD [facility] ICU Physician [place]  Pager: [phone] Or Epic Secure Chat After hours: [phone].  05-29-2021, 12:32 PM

## 2021-05-27 NOTE — Progress Notes (Signed)
Received from ED intubated with Levophed,Fentanyl,Bicarb and Epi drip.Dr.Jeong at bedside.Tt restless but able to follow commands.Will monitor closely

## 2021-05-27 NOTE — Progress Notes (Signed)
eLink Physician-Brief Progress Note Patient Name: [name] DOB: 28-Nov-1955 MRN: [id number]   Date of Service  2021/06/05  HPI/Events of Note  Patient with recurrence of hypotension, ionized calcium is down to 0.98. CVP 6.  eICU Interventions  Calcium chloride 1 gm iv x 1 now, Albumin 5 % 1000 ml iv bolus,  Vasopressin gtt added.        [name] Danalee Flath 06-05-21, 4:09 AM

## 2021-05-27 NOTE — Progress Notes (Signed)
RT NOTE: Patient extubated to comfort care per order and family wishes. RN and family at patient bedside. RT will continue to monitor.

## 2021-05-27 NOTE — Progress Notes (Signed)
Echocardiogram 2D Echocardiogram has been performed.  Oneal Deputy Zyan Mirkin RDCS 06/18/2021, 9:48 AM

## 2021-05-27 DEATH — deceased

## 2021-06-26 NOTE — Discharge Summary (Signed)
DEATH SUMMARY   Patient Details  Name: [name] MRN: [id number] DOB: 1956-04-23  Admission/Discharge Information   Admit Date:  2021-06-13  Date of Death: Date of Death: 2021-06-14  Time of Death: Time of Death: 12-15-1455  Length of Stay: 1  Referring Physician: System, Provider Not In   Reason(s) for Hospitalization  PEA cardiac arrest  Diagnoses  Preliminary cause of death:   Distributive shock Secondary Diagnoses (including complications and co-morbidities):  Principal Problem:   Cardiac arrest [facility]) Active Problems:   Alcohol abuse   High anion gap metabolic acidosis   AKI (acute kidney injury) ([place])   Altered mental status   Hyperkalemia   Endotracheally intubated   Toxic metabolic encephalopathy   Brief Hospital Course (including significant findings, care, treatment, and services provided and events leading to death)  [name] is a 65 y.o. year old female who was found unresponsive at home by her family.  On arrival in the ED she suffered a 20-minute PEA cardiac arrest.  Postarrest to remain severely acidotic and continued to require vasopressor infusions for refractory shock.  The principal cause of the arrest was felt to be respiratory.  She has a history of severe anxiety and may have self medicated.  She remained critically ill due to severe acute hypoxic and hypercarbic respiratory failure consistent with ARDS from aspiration and persistent distributive shock in spite of increasing vasopressor requirements.  We spoke with family and indicated to them that we had reached the limits of what we were capable of doing to reverse her continued downward decline.  We continued with aggressive support until family was present and spiritual needs met prior to a transition to comfort care and compassionate extubation.   Pertinent Labs and Studies  Significant Diagnostic Studies DG Chest 1 View  Result Date: 06/14/21 CLINICAL DATA:  Cardiac arrest, respiratory  failure EXAM: CHEST  1 VIEW COMPARISON:  Chest radiograph obtained 1 day prior FINDINGS: The endotracheal tube tip terminates approximately 3.2 cm from the carina. A left central venous catheter tip projects over the cavoatrial junction. An enteric catheter is partially out of the field of view, but the tip and side hole project over the stomach. Mediastinal drains are again noted. Defibrillator pads overlie the chest. The cardiomediastinal silhouette is grossly stable. Bilateral pleural effusions, right larger than left, with patchy airspace disease throughout both bases is overall not significantly changed. There is no pneumothorax. Right shoulder arthroplasty hardware is again seen. IMPRESSION: 1. Endotracheal tube tip in the midthoracic trachea. 2. Overall no significant change in lung aeration with right larger than left pleural effusions and patchy airspace disease. Electronically Signed   By: Valetta Mole M.D.   On: 06/14/2021 08:39   CT HEAD WO CONTRAST (5MM)  Result Date: 06/14/2021 CLINICAL DATA:  Recent CPR or proximally 20 minutes, possible overdose, initial encounter EXAM: CT HEAD WITHOUT CONTRAST TECHNIQUE: Contiguous axial images were obtained from the base of the skull through the vertex without intravenous contrast. COMPARISON:  05/01/2013 FINDINGS: Brain: No evidence of acute infarction, hemorrhage, hydrocephalus, extra-axial collection or mass lesion/mass effect. Mild atrophic changes are noted. Vascular: No hyperdense vessel or unexpected calcification. Skull: Normal. Negative for fracture or focal lesion. Sinuses/Orbits: No acute finding. Other: None. IMPRESSION: Mild atrophic changes without acute abnormality. Electronically Signed   By: Inez Catalina M.D.   On: June 14, 2021 00:00   DG Chest Port 1 View  Result Date: 2021-06-13 CLINICAL DATA:  Post intubation EXAM: PORTABLE CHEST 1 VIEW COMPARISON:  10/30/2020 FINDINGS: An endotracheal tube has been placed with tip measuring 3.6 cm above  the carina. Enteric tube is present with tip coiled in the left upper quadrant consistent with location in the upper stomach. Left central venous catheter with tip over the cavoatrial junction region. No pneumothorax. Mild cardiac enlargement. Mild vascular congestion. Probable bronchiectasis with perihilar infiltration. Small right pleural effusion. Old rib fractures. Postoperative changes in the right shoulder. IMPRESSION: 1. Appliances appear in satisfactory location. 2. Cardiac enlargement with vascular congestion and perihilar infiltration. Small right effusion. 3. Bronchiectasis. Electronically Signed   By: Lucienne Capers M.D.   On: 04/28/2021 20:00   ECHOCARDIOGRAM LIMITED  Result Date: 05-24-21    ECHOCARDIOGRAM LIMITED REPORT   Patient Name:   [name] Date of Exam: 05/24/21 Medical Rec #:  [id number]       Height:       62.0 in Accession #:    [id number]      Weight:       158.1 lb Date of Birth:  [birth year]      BSA:          1.730 m Patient Age:    24 years        BP:           99/45 mmHg Patient Gender: F               HR:           107 bpm. Exam Location:  Inpatient Procedure: Limited Echo, Color Doppler and Cardiac Doppler Indications:    Cardiac Arrest i46.9  History:        Patient has no prior history of Echocardiogram examinations.                 Risk Factors:ETOH, Hypertension and Diabetes.  Sonographer:    Raquel Sarna Senior RDCS Referring Phys: [id number] GRACE E BOWSER  Sonographer Comments: Limited to evaluate for effusion, scanned supine on artificial respirator. IMPRESSIONS  1. Technically difficult and limited study to R/O pericardial effusion; hyperdynamic LV function; elevated LVOT gradient (as high as 5.9 m/s; at least partially explained by hyperdynamic LV function); can consider repeat echo when pt more stable vs TEE to better assess; no pericardial effusion.  2. Left ventricular ejection fraction, by estimation, is >75%. The left ventricle has hyperdynamic function. The left  ventricle has no regional wall motion abnormalities.  3. Right ventricular systolic function is normal. The right ventricular size is mildly enlarged.  4. The mitral valve was not well visualized.  5. The aortic valve was not well visualized. FINDINGS  Left Ventricle: Left ventricular ejection fraction, by estimation, is >75%. The left ventricle has hyperdynamic function. The left ventricle has no regional wall motion abnormalities. Right Ventricle: The right ventricular size is mildly enlarged. Right ventricular systolic function is normal. Left Atrium: Left atrial size was normal in size. Right Atrium: Right atrial size was normal in size. Pericardium: There is no evidence of pericardial effusion. Mitral Valve: The mitral valve was not well visualized. Mild mitral annular calcification. Tricuspid Valve: The tricuspid valve is not well visualized. Aortic Valve: The aortic valve was not well visualized. Pulmonic Valve: The pulmonic valve was not well visualized. Aorta: Aortic root could not be assessed. Additional Comments: Technically difficult and limited study to R/O pericardial effusion; hyperdynamic LV function; elevated LVOT gradient (as high as 5.9 m/s; at least partially explained by hyperdynamic LV function); can consider repeat echo when pt more stable vs TEE to  better assess; no pericardial effusion. AORTIC VALVE LVOT Vmax:   154.00 cm/s LVOT Vmean:  126.000 cm/s LVOT VTI:    0.229 m  SHUNTS Systemic VTI: 0.23 m Kirk Ruths MD Electronically signed by Kirk Ruths MD Signature Date/Time: 01-Jun-2021/11:25:36 AM    Final     Microbiology No results found for this or any previous visit (from the past 240 hour(s)).  Lab Basic Metabolic Panel: No results for input(s): NA, K, CL, CO2, GLUCOSE, BUN, CREATININE, CALCIUM, MG, PHOS in the last 168 hours. Liver Function Tests: No results for input(s): AST, ALT, ALKPHOS, BILITOT, PROT, ALBUMIN in the last 168 hours. No results for input(s): LIPASE,  AMYLASE in the last 168 hours. No results for input(s): AMMONIA in the last 168 hours. CBC: No results for input(s): WBC, NEUTROABS, HGB, HCT, MCV, PLT in the last 168 hours. Cardiac Enzymes: No results for input(s): CKTOTAL, CKMB, CKMBINDEX, TROPONINI in the last 168 hours. Sepsis Labs: No results for input(s): PROCALCITON, WBC, LATICACIDVEN in the last 168 hours.  Procedures/Operations  Intubation mechanical ventilation.   Eann Cleland 06/10/2021, 12:20 PM

## 2021-08-25 ENCOUNTER — Other Ambulatory Visit: Payer: Medicare HMO

## 2021-08-26 DEATH — deceased
